# Patient Record
Sex: Female | Born: 1980 | Race: White | Hispanic: No | Marital: Married | Smoking: Former smoker
Health system: Southern US, Community
[De-identification: ages and names within clinical notes are randomized; demographics above are authoritative.]

## PROBLEM LIST (undated history)

## (undated) DIAGNOSIS — J309 Allergic rhinitis, unspecified: Secondary | ICD-10-CM

## (undated) DIAGNOSIS — R109 Unspecified abdominal pain: Secondary | ICD-10-CM

## (undated) DIAGNOSIS — G4700 Insomnia, unspecified: Secondary | ICD-10-CM

## (undated) DIAGNOSIS — N200 Calculus of kidney: Secondary | ICD-10-CM

## (undated) DIAGNOSIS — M549 Dorsalgia, unspecified: Secondary | ICD-10-CM

## (undated) DIAGNOSIS — Z87442 Personal history of urinary calculi: Secondary | ICD-10-CM

## (undated) DIAGNOSIS — S82202A Unspecified fracture of shaft of left tibia, initial encounter for closed fracture: Secondary | ICD-10-CM

## (undated) DIAGNOSIS — E739 Lactose intolerance, unspecified: Secondary | ICD-10-CM

## (undated) DIAGNOSIS — G43909 Migraine, unspecified, not intractable, without status migrainosus: Secondary | ICD-10-CM

## (undated) DIAGNOSIS — F988 Other specified behavioral and emotional disorders with onset usually occurring in childhood and adolescence: Secondary | ICD-10-CM

## (undated) DIAGNOSIS — Z91040 Latex allergy status: Secondary | ICD-10-CM

## (undated) DIAGNOSIS — G43009 Migraine without aura, not intractable, without status migrainosus: Secondary | ICD-10-CM

## (undated) DIAGNOSIS — M25579 Pain in unspecified ankle and joints of unspecified foot: Secondary | ICD-10-CM

## (undated) DIAGNOSIS — N1330 Unspecified hydronephrosis: Secondary | ICD-10-CM

## (undated) HISTORY — DX: Unspecified abdominal pain: R10.9

## (undated) HISTORY — DX: Unspecified fracture of shaft of left tibia, initial encounter for closed fracture: S82.202A

## (undated) HISTORY — DX: Other specified behavioral and emotional disorders with onset usually occurring in childhood and adolescence: F98.8

## (undated) HISTORY — DX: Calculus of kidney: N20.0

## (undated) HISTORY — DX: Personal history of urinary calculi: Z87.442

## (undated) HISTORY — PX: EYE SURGERY: SHX253

## (undated) HISTORY — DX: Allergic rhinitis, unspecified: J30.9

## (undated) HISTORY — DX: Lactose intolerance, unspecified: E73.9

## (undated) HISTORY — DX: Pain in unspecified ankle and joints of unspecified foot: M25.579

## (undated) HISTORY — DX: Insomnia, unspecified: G47.00

## (undated) HISTORY — DX: Dorsalgia, unspecified: M54.9

## (undated) HISTORY — PX: CATARACT EXTRACTION: SUR2

## (undated) HISTORY — PX: ABDOMINAL HYSTERECTOMY: SHX81

## (undated) HISTORY — DX: Migraine, unspecified, not intractable, without status migrainosus: G43.909

## (undated) HISTORY — DX: Unspecified hydronephrosis: N13.30

## (undated) HISTORY — DX: Migraine without aura, not intractable, without status migrainosus: G43.009

## (undated) HISTORY — DX: Latex allergy status: Z91.040

---
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
## 2003-07-08 HISTORY — PX: OTHER SURGICAL HISTORY: SHX169

## 2005-01-20 ENCOUNTER — Other Ambulatory Visit: Admission: RE | Admit: 2005-01-20 | Discharge: 2005-01-20 | Payer: Self-pay | Admitting: Obstetrics and Gynecology

## 2005-04-04 ENCOUNTER — Ambulatory Visit (HOSPITAL_COMMUNITY): Admission: RE | Admit: 2005-04-04 | Discharge: 2005-04-04 | Payer: Self-pay | Admitting: Obstetrics and Gynecology

## 2005-04-04 ENCOUNTER — Encounter (INDEPENDENT_AMBULATORY_CARE_PROVIDER_SITE_OTHER): Payer: Self-pay | Admitting: Specialist

## 2005-07-03 ENCOUNTER — Other Ambulatory Visit: Admission: RE | Admit: 2005-07-03 | Discharge: 2005-07-03 | Payer: Self-pay | Admitting: Obstetrics and Gynecology

## 2005-11-18 ENCOUNTER — Encounter (INDEPENDENT_AMBULATORY_CARE_PROVIDER_SITE_OTHER): Payer: Self-pay | Admitting: *Deleted

## 2005-11-18 ENCOUNTER — Ambulatory Visit (HOSPITAL_BASED_OUTPATIENT_CLINIC_OR_DEPARTMENT_OTHER): Admission: RE | Admit: 2005-11-18 | Discharge: 2005-11-18 | Payer: Self-pay | Admitting: Obstetrics and Gynecology

## 2005-11-25 ENCOUNTER — Other Ambulatory Visit: Admission: RE | Admit: 2005-11-25 | Discharge: 2005-11-25 | Payer: Self-pay | Admitting: Obstetrics and Gynecology

## 2006-01-19 ENCOUNTER — Emergency Department (HOSPITAL_COMMUNITY): Admission: EM | Admit: 2006-01-19 | Discharge: 2006-01-19 | Payer: Self-pay | Admitting: Emergency Medicine

## 2006-03-11 ENCOUNTER — Other Ambulatory Visit: Admission: RE | Admit: 2006-03-11 | Discharge: 2006-03-11 | Payer: Self-pay | Admitting: Obstetrics and Gynecology

## 2006-05-26 ENCOUNTER — Emergency Department (HOSPITAL_COMMUNITY): Admission: EM | Admit: 2006-05-26 | Discharge: 2006-05-26 | Payer: Self-pay | Admitting: Emergency Medicine

## 2006-05-29 ENCOUNTER — Emergency Department: Payer: Self-pay | Admitting: Emergency Medicine

## 2006-06-03 ENCOUNTER — Encounter: Admission: RE | Admit: 2006-06-03 | Discharge: 2006-06-03 | Payer: Self-pay | Admitting: *Deleted

## 2006-06-09 ENCOUNTER — Emergency Department: Payer: Self-pay | Admitting: Emergency Medicine

## 2006-06-18 ENCOUNTER — Encounter: Admission: RE | Admit: 2006-06-18 | Discharge: 2006-08-25 | Payer: Self-pay | Admitting: Obstetrics and Gynecology

## 2006-09-17 ENCOUNTER — Emergency Department (HOSPITAL_COMMUNITY): Admission: EM | Admit: 2006-09-17 | Discharge: 2006-09-17 | Payer: Self-pay | Admitting: Emergency Medicine

## 2007-01-19 ENCOUNTER — Emergency Department: Payer: Self-pay | Admitting: Emergency Medicine

## 2007-01-20 ENCOUNTER — Emergency Department: Payer: Self-pay | Admitting: Unknown Physician Specialty

## 2007-02-01 ENCOUNTER — Emergency Department (HOSPITAL_COMMUNITY): Admission: EM | Admit: 2007-02-01 | Discharge: 2007-02-01 | Payer: Self-pay | Admitting: Emergency Medicine

## 2007-05-06 ENCOUNTER — Emergency Department: Payer: Self-pay | Admitting: Emergency Medicine

## 2007-05-08 ENCOUNTER — Emergency Department: Payer: Self-pay

## 2007-09-01 ENCOUNTER — Emergency Department: Payer: Self-pay | Admitting: Emergency Medicine

## 2007-12-07 ENCOUNTER — Emergency Department: Payer: Self-pay | Admitting: Emergency Medicine

## 2007-12-09 ENCOUNTER — Emergency Department: Payer: Self-pay | Admitting: Emergency Medicine

## 2007-12-16 ENCOUNTER — Emergency Department: Payer: Self-pay | Admitting: Emergency Medicine

## 2008-03-16 ENCOUNTER — Emergency Department: Payer: Self-pay | Admitting: Emergency Medicine

## 2008-07-06 ENCOUNTER — Ambulatory Visit: Payer: Self-pay | Admitting: Unknown Physician Specialty

## 2008-07-07 HISTORY — PX: OOPHORECTOMY: SHX86

## 2008-10-10 ENCOUNTER — Emergency Department: Payer: Self-pay | Admitting: Emergency Medicine

## 2009-03-19 ENCOUNTER — Ambulatory Visit: Payer: Self-pay | Admitting: Internal Medicine

## 2009-03-19 DIAGNOSIS — E739 Lactose intolerance, unspecified: Secondary | ICD-10-CM

## 2009-03-19 DIAGNOSIS — G43109 Migraine with aura, not intractable, without status migrainosus: Secondary | ICD-10-CM | POA: Insufficient documentation

## 2009-03-19 DIAGNOSIS — Z87442 Personal history of urinary calculi: Secondary | ICD-10-CM | POA: Insufficient documentation

## 2009-03-19 DIAGNOSIS — G43009 Migraine without aura, not intractable, without status migrainosus: Secondary | ICD-10-CM

## 2009-03-19 DIAGNOSIS — M549 Dorsalgia, unspecified: Secondary | ICD-10-CM

## 2009-03-19 DIAGNOSIS — G4700 Insomnia, unspecified: Secondary | ICD-10-CM

## 2009-03-19 HISTORY — DX: Lactose intolerance, unspecified: E73.9

## 2009-03-19 HISTORY — DX: Personal history of urinary calculi: Z87.442

## 2009-03-19 HISTORY — DX: Dorsalgia, unspecified: M54.9

## 2009-03-19 HISTORY — DX: Migraine without aura, not intractable, without status migrainosus: G43.009

## 2009-03-19 HISTORY — DX: Insomnia, unspecified: G47.00

## 2009-03-21 ENCOUNTER — Telehealth: Payer: Self-pay | Admitting: Internal Medicine

## 2009-03-27 ENCOUNTER — Telehealth: Payer: Self-pay | Admitting: Internal Medicine

## 2009-04-03 ENCOUNTER — Ambulatory Visit: Payer: Self-pay | Admitting: Internal Medicine

## 2009-04-03 ENCOUNTER — Telehealth: Payer: Self-pay | Admitting: Internal Medicine

## 2009-04-03 DIAGNOSIS — M25579 Pain in unspecified ankle and joints of unspecified foot: Secondary | ICD-10-CM | POA: Insufficient documentation

## 2009-04-03 HISTORY — DX: Pain in unspecified ankle and joints of unspecified foot: M25.579

## 2009-04-30 ENCOUNTER — Ambulatory Visit: Payer: Self-pay | Admitting: Endocrinology

## 2009-04-30 DIAGNOSIS — J069 Acute upper respiratory infection, unspecified: Secondary | ICD-10-CM | POA: Insufficient documentation

## 2009-05-11 ENCOUNTER — Ambulatory Visit: Payer: Self-pay | Admitting: Cardiovascular Disease

## 2009-05-11 ENCOUNTER — Ambulatory Visit: Payer: Self-pay | Admitting: Internal Medicine

## 2009-05-11 DIAGNOSIS — N200 Calculus of kidney: Secondary | ICD-10-CM | POA: Insufficient documentation

## 2009-05-11 DIAGNOSIS — N1330 Unspecified hydronephrosis: Secondary | ICD-10-CM

## 2009-05-11 DIAGNOSIS — R109 Unspecified abdominal pain: Secondary | ICD-10-CM | POA: Insufficient documentation

## 2009-05-11 HISTORY — DX: Unspecified hydronephrosis: N13.30

## 2009-05-11 HISTORY — DX: Calculus of kidney: N20.0

## 2009-05-11 HISTORY — DX: Unspecified abdominal pain: R10.9

## 2009-05-11 LAB — CONVERTED CEMR LAB
ALT: 15 units/L (ref 0–35)
BUN: 6 mg/dL (ref 6–23)
Basophils Relative: 2.8 % (ref 0.0–3.0)
CO2: 31 meq/L (ref 19–32)
Chloride: 100 meq/L (ref 96–112)
Eosinophils Absolute: 0.4 10*3/uL (ref 0.0–0.7)
Eosinophils Relative: 2.2 % (ref 0.0–5.0)
Glucose, Urine, Semiquant: NEGATIVE
HCT: 40.4 % (ref 36.0–46.0)
Ketones, urine, test strip: NEGATIVE
Lipase: 19 units/L (ref 11.0–59.0)
Lymphs Abs: 2.9 10*3/uL (ref 0.7–4.0)
MCHC: 33.9 g/dL (ref 30.0–36.0)
MCV: 85.8 fL (ref 78.0–100.0)
Monocytes Absolute: 1.3 10*3/uL — ABNORMAL HIGH (ref 0.1–1.0)
Nitrite: NEGATIVE
Platelets: 375 10*3/uL (ref 150.0–400.0)
Potassium: 4.1 meq/L (ref 3.5–5.1)
Protein, U semiquant: NEGATIVE
Total Bilirubin: 0.5 mg/dL (ref 0.3–1.2)
Total Protein: 7.2 g/dL (ref 6.0–8.3)
Urobilinogen, UA: 0.2
WBC: 17.6 10*3/uL — ABNORMAL HIGH (ref 4.5–10.5)

## 2009-05-12 ENCOUNTER — Inpatient Hospital Stay: Payer: Self-pay | Admitting: Internal Medicine

## 2009-05-12 ENCOUNTER — Encounter: Payer: Self-pay | Admitting: Internal Medicine

## 2009-05-12 ENCOUNTER — Emergency Department: Payer: Self-pay | Admitting: Emergency Medicine

## 2009-05-12 ENCOUNTER — Telehealth: Payer: Self-pay | Admitting: Family Medicine

## 2009-05-14 ENCOUNTER — Telehealth: Payer: Self-pay | Admitting: Internal Medicine

## 2009-05-17 ENCOUNTER — Ambulatory Visit: Payer: Self-pay | Admitting: Urology

## 2009-05-21 ENCOUNTER — Ambulatory Visit: Payer: Self-pay | Admitting: Urology

## 2009-07-07 HISTORY — PX: LITHOTRIPSY: SUR834

## 2009-07-16 ENCOUNTER — Telehealth: Payer: Self-pay | Admitting: Internal Medicine

## 2009-08-06 ENCOUNTER — Telehealth: Payer: Self-pay | Admitting: Internal Medicine

## 2009-08-07 ENCOUNTER — Ambulatory Visit: Payer: Self-pay | Admitting: Internal Medicine

## 2009-10-11 ENCOUNTER — Ambulatory Visit: Payer: Self-pay | Admitting: Internal Medicine

## 2009-10-11 DIAGNOSIS — J309 Allergic rhinitis, unspecified: Secondary | ICD-10-CM

## 2009-10-11 HISTORY — DX: Allergic rhinitis, unspecified: J30.9

## 2009-11-22 ENCOUNTER — Ambulatory Visit: Payer: Self-pay | Admitting: Internal Medicine

## 2010-01-14 ENCOUNTER — Ambulatory Visit: Payer: Self-pay | Admitting: Internal Medicine

## 2010-02-13 ENCOUNTER — Telehealth: Payer: Self-pay | Admitting: Internal Medicine

## 2010-02-21 ENCOUNTER — Encounter: Payer: Self-pay | Admitting: Internal Medicine

## 2010-04-08 ENCOUNTER — Telehealth: Payer: Self-pay | Admitting: Internal Medicine

## 2010-04-11 ENCOUNTER — Telehealth: Payer: Self-pay | Admitting: Internal Medicine

## 2010-04-11 DIAGNOSIS — Z91040 Latex allergy status: Secondary | ICD-10-CM

## 2010-04-11 HISTORY — DX: Latex allergy status: Z91.040

## 2010-04-18 ENCOUNTER — Telehealth: Payer: Self-pay | Admitting: Internal Medicine

## 2010-05-20 ENCOUNTER — Telehealth: Payer: Self-pay | Admitting: Internal Medicine

## 2010-05-24 ENCOUNTER — Ambulatory Visit: Payer: Self-pay | Admitting: Internal Medicine

## 2010-06-12 ENCOUNTER — Ambulatory Visit: Payer: Self-pay | Admitting: Internal Medicine

## 2010-06-26 ENCOUNTER — Telehealth: Payer: Self-pay | Admitting: Internal Medicine

## 2010-07-11 ENCOUNTER — Ambulatory Visit
Admission: RE | Admit: 2010-07-11 | Discharge: 2010-07-11 | Payer: Self-pay | Source: Home / Self Care | Attending: Internal Medicine | Admitting: Internal Medicine

## 2010-07-16 ENCOUNTER — Telehealth: Payer: Self-pay | Admitting: Internal Medicine

## 2010-07-16 ENCOUNTER — Encounter: Payer: Self-pay | Admitting: Internal Medicine

## 2010-08-06 NOTE — Progress Notes (Signed)
  Phone Note Call from Patient Call back at [phone] or [phone]   Summary of Call: Pt left message on triage stating that she was given an anti-inflammatory for hip pain and she cannot take it b/c she is allergic to NSAIDS. Pt wants to be seen. Called pt for move info left message on voicemail for call back. Initial call taken by: Margaret Pyle, CMA,  August 06, 2009 10:09 AM  Follow-up for Phone Call        left message on machine for pt to return my call. Margaret Pyle, CMA  August 06, 2009 2:02 PM   Additional Follow-up for Phone Call Additional follow up Details #1::        pt scheduled appt tomorrow Additional Follow-up by: Margaret Pyle, CMA,  August 06, 2009 4:25 PM

## 2010-08-06 NOTE — Assessment & Plan Note (Signed)
Summary: allergies/cb   Vital Signs:  Patient profile:   30 year old female Height:      68 inches Weight:      191 pounds BMI:     29.15 O2 Sat:      97 % on Room air Pulse rate:   85 / minute BP sitting:   116 / 68  (left arm) Cuff size:   regular  Vitals Entered By: Reynaldo Minium CMA (May 24, 2010 2:00 PM)  O2 Flow:  Room air CC: Allergy consult-Dr. Jonny Ruiz   Primary Provider/Referring Provider:  Oliver Barre  CC:  Allergy consult-Dr. Jonny Ruiz.  History of Present Illness: May 24, 2010- 29 yoF referred by Dr Jonny Ruiz for allergy evaluation. 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 She describes contact latex dermatitis for many years. Nitrile gloves now also give contact irritation on her hands so she is using polypropylene gloves.  5 weeks ago balloons were brought into her work area. She began itching and chest got tight. Took benadryl, but she says she got more short of breath then woke with paramedics around her, but didn't need to go to hospital.  Hx of skin testing age 89, but doesn't describe allergy vaccine then. Cats cause eyes to swell. Seasonal allergic rhinitis has been unusually bad this Fall. Asthma as a young child. Pneumonia 5 years ago. Strong local reaction to fire ant stings while in [place] of [place].  Aspirin makes eyes itch and swell- no hx of polyps. Last prednisone 6 months ago.   Preventive Screening-Counseling & Management  Alcohol-Tobacco     Smoking Status: quit     Packs/Day: 1.0     Year Started: smoked for approx 8-10 years     Year Quit: 2011  Current Medications (verified): 1)  Zolpidem Tartrate 10 Mg Tabs (Zolpidem Tartrate) .Marland Kitchen.. 1po At Bedtime As Needed 2)  Tramadol Hcl 50 Mg Tabs (Tramadol Hcl) .Marland Kitchen.. 1 By Mouth Q 6 Hrs As Needed Pain 3)  Carisoprodol 350 Mg Tabs (Carisoprodol) .Marland Kitchen.. 1 By Mouth Four Times Per Day As Needed 4)  Calcium 1500 Mg Tabs (Calcium Carbonate) .... Take 1 By Mouth Once  Daily  Allergies (verified): 1)  ! Nsaids 2)  ! Penicillin 3)  ! Augmentin 4)  ! Ibuprofen 5)  ! Aspirin 6)  * Latex  Past History:  Past Medical History: Last updated: 10/11/2009 Nephrolithiasis, hx of migraine PCOS and endometriosis glucose intolerance goiter by exam recurrent lower back pain Allergic rhinitis  Past Surgical History: Last updated: 03/19/2009 Hysterectomy Oophorectomy bilat  - 07/2008 Appendectomy - 1994 s/p left knee arthroscopic 1994 hx of etopic pregnancy 2005  Family History: Last updated: 05/24/2010 mother with manic depressive/bipolar, ETOH, low thyroid father with HTN, seasonal rhinitis grandmother with HTN , DM, heart disease, stroke grandfather with lung cancer  Social History: Last updated: 03/19/2009 Married no children 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  The patient  denies shortness of breath with activity, shortness of breath at rest, non-productive cough, coughing up blood, chest pain, irregular heartbeats, acid heartburn, indigestion, loss of appetite, weight change, abdominal pain, difficulty swallowing, sore throat, tooth/dental problems, ear ache, anxiety, depression, hand/feet swelling, joint stiffness or pain, rash, and fever.    Physical Exam  Additional Exam:  General: A/Ox3; pleasant and cooperative, NAD, healthy appearing SKIN: no rash, lesions NODES: no  lymphadenopathy HEENT: [place]/AT, EOM- WNL, Conjuctivae- clear, PERRLA, TM-WNL, Nose- clear, Throat- clear and wnl, tongue stud, tonsils NECK: Supple w/ fair ROM, JVD- none, normal carotid impulses w/o bruits Thyroid- normal to palpation CHEST: Clear to P&A HEART: RRR, no m/g/r heard ABDOMEN: Soft and nl; nml bowel sounds; no organomegaly or masses noted KGM:WNUU, nl pulses, no edema  NEURO: Grossly intact to observation      Impression & Recommendations:  Problem # 1:  *** LATEX ALLERGY *** (ICD-V15.07) History consistent with significiant allergy to latex and also to Nitrile gloves. She should not be exposed to latex in any form, including balloons or gloves.  Note for work to avoid exposure Epipen latex IgE panel- through her employer/ [facility]  Problem # 2:  ALLERGIC RHINITIS (ICD-477.9) She also describes a seasonal allergy pattern with rhinitis and conjunctivitis.  We wlil get IgE allergy profile, bring her back for skin testing. She can use antihistamines, decongestants, and we will try a nasal steroid.  The following medications were removed from the medication list:    Levocetirizine Dihydrochloride 5 Mg Tabs (Levocetirizine dihydrochloride) .Marland Kitchen... 1po once daily as needed Her updated medication list for this problem includes:    Flonase 50 Mcg/act Susp (Fluticasone propionate) .Marland Kitchen... 1-2 sprays each nostril every night at bedtime during allergy season  Medications Added to Medication List This Visit: 1)  Calcium 1500 Mg Tabs (Calcium carbonate) .... Take 1 by mouth once daily 2)  Epipen 2-pak 0.3 Mg/0.40ml Devi (Epinephrine) .... For severe allergic reaction 3)  Flonase 50 Mcg/act Susp (Fluticasone propionate) .Marland Kitchen.. 1-2 sprays each nostril every night at bedtime during allergy season  Other Orders: Consultation Level IV (72536)  Patient Instructions: 1)  Schedule return as able for allergy skin testing.-Stop all antihistamines 3 days before skin testing, including cold and  allergy meds, otc sleep and cough meds.  2)  Sample Omnaris nasal steroid spray, with back up script for Flonase/ fluticasone 3)    1-2 sprays each nostril once daily at bedtime 4)  Script Epipen sent to drug store 5)  Note for work- avoid all latex. Use polypropylene gloves 6)  Labs to be drawn at [facility]- allergy profile, latex panel, total IgE 7)  cc Dr Jonny Ruiz Prescriptions: Aleda Grana 50 MCG/ACT SUSP (FLUTICASONE PROPIONATE) 1-2 sprays each nostril every night at bedtime during allergy season  #1 x prn   Entered and Authorized by:   Waymon Budge MD   Signed by:   Waymon Budge MD on 05/24/2010   Method used:   Print then Give to Patient   RxID:   [id number] EPIPEN 2-PAK 0.3 MG/0.3ML DEVI (EPINEPHRINE) For severe allergic reaction  #1 x prn   Entered and Authorized by:   Waymon Budge MD   Signed by:   Waymon Budge MD on 05/24/2010   Method used:   Electronically to        [facility]       [address]       [place]  [place], [place]  [address]       Ph: [phone]       Fax: [phone]   RxID:   [id number]    Orders Added: 1)  Consultation Level IV [40102]

## 2010-08-06 NOTE — Assessment & Plan Note (Signed)
Summary: PULLED MUSCLE IN NECK/NWS   Vital Signs:  Patient profile:   30 year old female Height:      68 inches Weight:      188.25 pounds BMI:     28.73 O2 Sat:      98 % on Room air Temp:     98.1 degrees F oral Pulse rate:   90 / minute BP sitting:   110 / 80  (left arm) Cuff size:   regular  Vitals Entered ByZella Ball Ewing (Nov 22, 2009 4:42 PM)  O2 Flow:  Room air CC: Pulled muscle in neck/RE   CC:  Pulled muscle in neck/RE.  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  No other trauma or injury;  no prior hx of same, no recent films or MRI, ortho or chiropracter tx.   No fever, ST, or cough and Pt denies CP, sob, doe, wheezing, orthopnea, pnd, worsening LE edema, palps, dizziness or syncope  Pt denies new neuro symptoms such as headache, facial or extremity weakness   Problems Prior to Update: 1)  Allergic Rhinitis  (ICD-477.9) 2)  Renal Calculus, Right  (ICD-592.0) 3)  Hydronephrosis, Right  (ICD-591) 4)  Flank Pain, Right  (ICD-789.09) 5)  Uri  (ICD-465.9) 6)  Ankle Pain, Right  (ICD-719.47) 7)  Insomnia-sleep Disorder-unspec  (ICD-780.52) 8)  Back Pain  (ICD-724.5) 9)  Glucose Intolerance  (ICD-271.3) 10)  Common Migraine  (ICD-346.10) 11)  Nephrolithiasis, Hx of   (ICD-V13.01)  Medications Prior to Update: 1)  Ortho Tri-Cyclen Lo 0.18/0.215/0.25 Mg-25 Mcg Tabs (Norgestim-Eth Estrad Triphasic) .Marland Kitchen.. 1 By Mouth Once Daily 2)  Zolpidem Tartrate 10 Mg Tabs (Zolpidem Tartrate) .Marland Kitchen.. 1po At Bedtime As Needed 3)  Tramadol Hcl 50 Mg Tabs (Tramadol Hcl) .Marland Kitchen.. 1 By Mouth Q 6 Hrs As Needed 4)  Flexeril 5 Mg Tabs (Cyclobenzaprine Hcl) .Marland Kitchen.. 1 By Mouth Three Times A Day As Needed Spasm 5)  Azithromycin 250 Mg Tabs (Azithromycin) .... 2po Qd For 1 Day, Then 1po Qd For 4days, Then Stop 6)  Fexofenadine Hcl 180 Mg Tabs (Fexofenadine Hcl) .Marland Kitchen.. 1po Once Daily As Needed Allergies (Generic Allegra)  Current Medications (verified): 1)  Ortho Tri-Cyclen Lo 0.18/0.215/0.25 Mg-25 Mcg Tabs (Norgestim-Eth Estrad Triphasic) .Marland Kitchen.. 1 By Mouth Once Daily 2)  Zolpidem Tartrate 10 Mg Tabs (Zolpidem Tartrate) .Marland Kitchen.. 1po At Bedtime As Needed 3)  Hydrocodone-Acetaminophen 7.5-325 Mg Tabs (Hydrocodone-Acetaminophen) .Marland Kitchen.. 1 By Mouth Q 6 Hrs As Needed Pain 4)  Carisoprodol 350 Mg Tabs (Carisoprodol) .Marland Kitchen.. 1 By Mouth Four Times Per Day As Needed 5)  Fexofenadine Hcl 180 Mg Tabs (Fexofenadine Hcl) .Marland Kitchen.. 1po Once Daily As Needed Allergies (Generic Allegra) 6)  Prednisone 10 Mg Tabs (Prednisone) .... 4po Qd For 3days, Then 3po Qd For 3days, Then 2po Qd  For 3days, Then 1po Qd For 3 Days, Then Stop  Allergies (verified): 1)  ! Nsaids 2)  ! Penicillin 3)  ! Augmentin 4)  ! Ibuprofen 5)  ! 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   Head:  normocephalic and atraumatic.   Eyes:  vision grossly intact, pupils equal, and pupils round.   Ears:  R ear normal and L ear normal.   Nose:  no external deformity and no external erythema.   Mouth:  no gingival abnormalities and pharynx pink and moist.   Neck:  supple and no masses.   Lungs:  normal respiratory effort and normal breath sounds.   Heart:  normal rate and regular rhythm.   Msk:  mod to severe tender right trapezoid and post lat neck;  spine nontender througout and no paravertebral swelling, no rash Extremities:  no edema, no erythema  Neurologic:  cranial nerves II-XII intact, strength normal in all extremities, sensation intact to light touch, gait normal, and DTRs symmetrical and normal.     Impression & Recommendations:  Problem # 1:  BACK PAIN (ICD-724.5)  Her updated medication list for this problem includes:    Hydrocodone-acetaminophen 7.5-325 Mg Tabs (Hydrocodone-acetaminophen) .Marland Kitchen... 1 by mouth q 6 hrs as needed pain    Carisoprodol 350 Mg Tabs (Carisoprodol) .Marland Kitchen... 1 by mouth four times per day as needed trapezoid with right neck pain  - cant r/o radiculitis but exam is benign and suspect more likely severe MSK strain related to work;  ok for med tx as above and pred pack, consider MRI and/or ortho if worse; gave note for work  Complete Medication List: 1)  Ortho Tri-cyclen Lo 0.18/0.215/0.25 Mg-25 Mcg Tabs (Norgestim-eth estrad triphasic) .Marland Kitchen.. 1 by mouth once daily 2)  Zolpidem Tartrate 10 Mg Tabs (Zolpidem tartrate) .Marland Kitchen.. 1po at bedtime as needed 3)  Hydrocodone-acetaminophen 7.5-325 Mg Tabs (Hydrocodone-acetaminophen) .Marland Kitchen.. 1 by mouth q 6 hrs as needed pain 4)  Carisoprodol 350 Mg Tabs (Carisoprodol) .Marland Kitchen.. 1 by mouth four times per day as  needed 5)  Fexofenadine Hcl 180 Mg Tabs (Fexofenadine hcl) .Marland Kitchen.. 1po once daily as needed allergies (generic allegra) 6)  Prednisone 10 Mg Tabs (Prednisone) .... 4po qd for 3days, then 3po qd for 3days, then 2po qd for 3days, then 1po qd for 3 days, then stop  Patient Instructions: 1)  Please take all new medications as prescribed - the pain medicine, muscle relaxer, and prednisone 2)  Continue all previous medications as before this visit  3)  Please schedule a follow-up appointment as needed. Prescriptions: HYDROCODONE-ACETAMINOPHEN 7.5-325 MG TABS (HYDROCODONE-ACETAMINOPHEN) 1 by mouth q 6 hrs as needed pain  #60 x 1   Entered and Authorized by:   Corwin Levins MD   Signed by:   Corwin Levins MD on 11/22/2009   Method used:   Print then  Give to Patient   RxID:   [id number] PREDNISONE 10 MG TABS (PREDNISONE) 4po qd for 3days, then 3po qd for 3days, then 2po qd for 3days, then 1po qd for 3 days, then stop  #30 x 0   Entered and Authorized by:   Corwin Levins MD   Signed by:   Corwin Levins MD on 11/22/2009   Method used:   Print then Give to Patient   RxID:   [id number] CARISOPRODOL 350 MG TABS (CARISOPRODOL) 1 by mouth four times per day as needed  #60 x 1   Entered and Authorized by:   Corwin Levins MD   Signed by:   Corwin Levins MD on 11/22/2009   Method used:   Print then Give to Patient   RxID:   [id number]

## 2010-08-06 NOTE — Letter (Signed)
Summary: Out of Work  [facility]  [address] [place], [place] [address]   Phone: [phone]  Fax: [phone]    October 11, 2009   Employee:  [name]    To Whom It May Concern:   For Medical reasons, please excuse the above named employee from work for the following dates:  Start:   Oct 11, 2009  End:   Oct 13, 2009   -   to return to work after this date  If you need additional information, please feel free to contact our office.         Sincerely,    Corwin Levins MD

## 2010-08-06 NOTE — Progress Notes (Signed)
Summary: medication refill  Phone Note Refill Request Message from:  Fax from Pharmacy on April 18, 2010 4:29 PM  Refills Requested: Medication #1:  ZOLPIDEM TARTRATE 10 MG TABS 1po at bedtime as needed   Dosage confirmed as above?Dosage Confirmed   Last Refilled: 10/11/2009   Notes: [facility] [address] [place], [place] ZOX#[id number] Initial call taken by: Zella Ball Ewing CMA Duncan Dull),  April 18, 2010 4:29 PM  Follow-up for Phone Call        done hardcopy to [facility] side B - dahlia  Follow-up by: Corwin Levins MD,  April 18, 2010 4:36 PM  Additional Follow-up for Phone Call Additional follow up Details #1::        faxed hardcopy to pharmacy. Additional Follow-up by: Robin Ewing CMA Duncan Dull),  April 18, 2010 4:38 PM    New/Updated Medications: ZOLPIDEM TARTRATE 10 MG TABS (ZOLPIDEM TARTRATE) 1po at bedtime as needed Prescriptions: ZOLPIDEM TARTRATE 10 MG TABS (ZOLPIDEM TARTRATE) 1po at bedtime as needed  #30 x 5   Entered and Authorized by:   Corwin Levins MD   Signed by:   Corwin Levins MD on 04/18/2010   Method used:   Print then Give to Patient   RxID:   [id number]

## 2010-08-06 NOTE — Letter (Signed)
Summary: [facility]  [facility]   Imported By: Lester Antioch 02/28/2010 09:13:17  _____________________________________________________________________  External Attachment:    Type:   Image     Comment:   External Document

## 2010-08-06 NOTE — Assessment & Plan Note (Signed)
Summary: sore throat  stc   Vital Signs:  Patient profile:   30 year old female Height:      68 inches Weight:      185.25 pounds BMI:     28.27 O2 Sat:      99 % on Room air Temp:     98.4 degrees F oral Pulse rate:   99 / minute BP sitting:   108 / 70  (left arm) Cuff size:   regular  Vitals Entered ByZella Ball Ewing (October 11, 2009 10:02 AM)  O2 Flow:  Room air CC: Sore Throat, refills/RE   CC:  Sore Throat and refills/RE.  History of Present Illness: here wtih 2 to 3 days onset mild to mod ST with slight cough nonprod and headache, general weakness and malaise;  Pt denies CP, sob, doe, wheezing, orthopnea, pnd, worsening LE edema, palps, dizziness or syncope This is on top of 3 to 4 wks onset nasal allergy symtpoms with clear drainage, no facial pain or colored d/c or tongue swelling or wheezing.   Also mentions trazodone not working as well as [place] did previously.  also states back pain no change and persistent, without change in radicular symtpoms, fever, wt loss, night sweats, bowel or bladder change - needs med refills,. tramadol works well.    Problems Prior to Update: 1)  Pharyngitis-acute  (ICD-462) 2)  Allergic Rhinitis  (ICD-477.9) 3)  Renal Calculus, Right  (ICD-592.0) 4)  Hydronephrosis, Right  (ICD-591) 5)  Flank Pain, Right  (ICD-789.09) 6)  Uri  (ICD-465.9) 7)  Ankle Pain, Right  (ICD-719.47) 8)  Insomnia-sleep Disorder-unspec  (ICD-780.52) 9)  Back Pain  (ICD-724.5) 10)  Glucose Intolerance  (ICD-271.3) 11)  Common Migraine  (ICD-346.10) 12)  Nephrolithiasis, Hx of  (ICD-V13.01)  Medications Prior to Update: 1)  Ortho Tri-Cyclen Lo 0.18/0.215/0.25 Mg-25 Mcg Tabs (Norgestim-Eth Estrad Triphasic) .Marland Kitchen.. 1 By Mouth Once Daily 2)  Trazodone Hcl 50 Mg Tabs (Trazodone Hcl) .Marland Kitchen.. 1 By Mouth At Bedtime As Needed 3)  Tramadol Hcl 50 Mg Tabs (Tramadol Hcl) .Marland Kitchen.. 1 By Mouth Q 6 Hrs As Needed 4)  Flexeril 5 Mg Tabs (Cyclobenzaprine Hcl) .Marland Kitchen.. 1 By Mouth Three Times A  Day As Needed Spasm  Current Medications (verified): 1)  Ortho Tri-Cyclen Lo 0.18/0.215/0.25 Mg-25 Mcg Tabs (Norgestim-Eth Estrad Triphasic) .Marland Kitchen.. 1 By Mouth Once Daily 2)  Zolpidem Tartrate 10 Mg Tabs (Zolpidem Tartrate) .Marland Kitchen.. 1po At Bedtime As Needed 3)  Tramadol Hcl 50 Mg Tabs (Tramadol Hcl) .Marland Kitchen.. 1 By Mouth Q 6 Hrs As Needed 4)  Flexeril 5 Mg Tabs (Cyclobenzaprine Hcl) .Marland Kitchen.. 1 By Mouth Three Times A Day As Needed Spasm 5)  Azithromycin 250 Mg Tabs (Azithromycin) .... 2po Qd For 1 Day, Then 1po Qd For 4days, Then Stop 6)  Fexofenadine Hcl 180 Mg Tabs (Fexofenadine Hcl) .Marland Kitchen.. 1po Once Daily As Needed Allergies (Generic Allegra)  Allergies (verified): 1)  ! Nsaids 2)  ! Penicillin 3)  ! Augmentin 4)  ! Ibuprofen 5)  ! 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   Eyes:  vision grossly intact, pupils equal, and pupils round.   Ears:  bilat tm's red, sinus nontender Nose:  nasal dischargemucosal pallor and mucosal edema.   Mouth:  pharyngeal erythema, fair dentition, and pharyngeal exudate.   Neck:  supple and cervical lymphadenopathy.   Lungs:  normal respiratory effort and normal breath sounds.   Heart:  normal rate and regular rhythm.   Msk:  no spine tender or paravertebral tender or  spasm Extremities:  no edema, no erythema  Neurologic:  strength normal in all extremities and DTRs symmetrical and normal.     Impression & Recommendations:  Problem # 1:  PHARYNGITIS-ACUTE (ICD-462)  Her updated medication list for this problem includes:    Azithromycin 250 Mg Tabs (Azithromycin) .Marland Kitchen... 2po qd for 1 day, then 1po qd for 4days, then stop treat as above, f/u any worsening signs or symptoms , off work note given  Problem # 2:  ALLERGIC RHINITIS (ICD-477.9)  Her updated medication list for this problem includes:    Fexofenadine Hcl 180 Mg Tabs (Fexofenadine hcl) .Marland Kitchen... 1po once daily as needed allergies (generic allegra) treat as above, f/u any worsening signs or symptoms   Problem # 3:  INSOMNIA-SLEEP DISORDER-UNSPEC (ICD-780.52)  Her updated medication list for this problem includes:    Zolpidem Tartrate 10 Mg Tabs (Zolpidem tartrate) .Marland Kitchen... 1po at bedtime as needed change back to above as needed use  Problem # 4:  BACK PAIN (ICD-724.5)  Her updated medication list for this problem includes:    Tramadol Hcl 50 Mg Tabs (Tramadol hcl) .Marland Kitchen... 1 by mouth q 6 hrs as needed    Flexeril 5 Mg Tabs (Cyclobenzaprine hcl) .Marland Kitchen... 1 by mouth three times a day as needed spasm recurrent, mild to mod, stable overall by hx and exam, ok to continue meds/tx as is   Complete Medication List: 1)  Ortho Tri-cyclen Lo 0.18/0.215/0.25 Mg-25 Mcg Tabs (Norgestim-eth estrad triphasic) .Marland Kitchen.. 1 by mouth once daily 2)  Zolpidem Tartrate 10 Mg Tabs (Zolpidem tartrate) .Marland Kitchen.. 1po at bedtime as needed 3)  Tramadol Hcl 50 Mg Tabs (Tramadol hcl) .Marland Kitchen.. 1 by mouth q 6 hrs as needed 4)  Flexeril 5 Mg Tabs (Cyclobenzaprine hcl) .Marland Kitchen.. 1 by mouth three times a day as needed spasm 5)  Azithromycin 250 Mg Tabs (Azithromycin) .... 2po qd for 1 day, then 1po qd for 4days, then stop 6)  Fexofenadine Hcl 180 Mg Tabs (Fexofenadine hcl) .Marland Kitchen.. 1po once daily as needed allergies (generic allegra)  Patient  Instructions: 1)  Please take all new medications as prescribed  2)  Continue all previous medications as before this visit  3)  Please schedule a follow-up appointment as needed. Prescriptions: FEXOFENADINE HCL 180 MG TABS (FEXOFENADINE HCL) 1po once daily as needed allergies (generic allegra)  #30 x 5   Entered and Authorized by:   Corwin Levins MD   Signed by:   Corwin Levins MD on 10/11/2009   Method used:   Print then Give to Patient   RxID:   [id number] AZITHROMYCIN 250 MG TABS (AZITHROMYCIN) 2po qd for 1 day, then 1po qd for 4days, then stop  #6 x 1   Entered and Authorized by:   Corwin Levins MD  Signed by:   Corwin Levins MD on 10/11/2009   Method used:   Print then Give to Patient   RxID:   [id number] FLEXERIL 5 MG TABS (CYCLOBENZAPRINE HCL) 1 by mouth three times a day as needed spasm  #60 x 2   Entered and Authorized by:   Corwin Levins MD   Signed by:   Corwin Levins MD on 10/11/2009   Method used:   Print then Give to Patient   RxID:   [id number] TRAMADOL HCL 50 MG TABS (TRAMADOL HCL) 1 by mouth q 6 hrs as needed  #60 x 2   Entered and Authorized by:   Corwin Levins MD   Signed by:   Corwin Levins MD on 10/11/2009   Method used:   Print then Give to Patient   RxID:   [id number] ZOLPIDEM TARTRATE 10 MG TABS (ZOLPIDEM TARTRATE) 1po at bedtime as needed  #30 x 5   Entered and Authorized by:   Corwin Levins MD   Signed by:   Corwin Levins MD on 10/11/2009   Method used:   Print then Give to Patient   RxID:   [id number]

## 2010-08-06 NOTE — Assessment & Plan Note (Signed)
Summary: med problem/#/cd   Vital Signs:  Patient profile:   30 year old female Height:      68 inches Weight:      184 pounds BMI:     28.08 O2 Sat:      98 % on Room air Temp:     98.2 degrees F oral Pulse rate:   87 / minute BP sitting:   120 / 84  (right arm) Cuff size:   regular  Vitals Entered ByZella Ball Ewing (August 07, 2009 8:21 AM)  O2 Flow:  Room air CC: med change/RE   CC:  med change/RE.  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  Last lumbar films neg in sept 2010 for acute , and no sig deg changes.  Has tens unit for pain related to ? sciatica in the past but cant use all the time.  No GU symtpoms now such as dysuria or blood.  Meloxicam and ambien not working at this time.  More stress lately as well, but denies depressive symptoms or panic. No recent increased headaches or migraine.    Problems Prior to Update: 1)  Renal Calculus, Right  (ICD-592.0) 2)  Hydronephrosis, Right  (ICD-591) 3)  Flank Pain, Right  (ICD-789.09) 4)  Uri  (ICD-465.9) 5)  Ankle Pain, Right  (ICD-719.47) 6)  Insomnia-sleep Disorder-unspec   (ICD-780.52) 7)  Back Pain  (ICD-724.5) 8)  Glucose Intolerance  (ICD-271.3) 9)  Common Migraine  (ICD-346.10) 10)  Nephrolithiasis, Hx of  (ICD-V13.01)  Medications Prior to Update: 1)  Ortho Tri-Cyclen Lo 0.18/0.215/0.25 Mg-25 Mcg Tabs (Norgestim-Eth Estrad Triphasic) .Marland Kitchen.. 1 By Mouth Once Daily 2)  Zolpidem Tartrate 10 Mg Tabs (Zolpidem Tartrate) .Marland Kitchen.. 1 By Mouth At Bedtime As Needed 3)  Meloxicam 15 Mg Tabs (Meloxicam) .Marland Kitchen.. 1po Once Daily As Needed 4)  Ciprofloxacin Hcl 500 Mg Tabs (Ciprofloxacin Hcl) .Marland Kitchen.. 1 By Mouth Two Times A Day 5)  Oxycodone Hcl 5 Mg Tabs (Oxycodone Hcl) .Marland Kitchen.. 1  - 3 By Mouth Q 6 Hrs As Needed Pain  Current Medications (verified): 1)  Ortho Tri-Cyclen Lo 0.18/0.215/0.25 Mg-25 Mcg Tabs (Norgestim-Eth Estrad Triphasic) .Marland Kitchen.. 1 By Mouth Once Daily 2)  Trazodone Hcl 50 Mg Tabs (Trazodone Hcl) .Marland Kitchen.. 1 By Mouth At Bedtime As Needed 3)  Tramadol Hcl 50 Mg Tabs (Tramadol Hcl) .Marland Kitchen.. 1 By Mouth Q 6 Hrs As Needed 4)  Flexeril 5 Mg Tabs (Cyclobenzaprine Hcl) .Marland Kitchen.. 1 By Mouth Three Times A Day As Needed Spasm  Allergies (verified): 1)  !  Nsaids 2)  ! Penicillin 3)  ! Augmentin 4)  ! Ibuprofen 5)  ! 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   Head:  normocephalic and atraumatic.   Eyes:  vision grossly intact, pupils equal, and pupils round.    Ears:  R ear normal and L ear normal.   Nose:  no external deformity and no nasal discharge.   Mouth:  no gingival abnormalities and pharynx pink and moist.   Neck:  supple and no masses.   Lungs:  normal respiratory effort and normal breath sounds.   Heart:  normal rate and regular rhythm.   Msk:  spine nontender and bilat lumbar and flank areas nontender Extremities:  no edema, no erythema  Neurologic:  cranial nerves II-XII intact, strength normal in all extremities, and sensation intact to light touch.     Impression & Recommendations:  Problem # 1:  BACK PAIN (ICD-724.5)  Her updated medication list for this problem includes:    Tramadol Hcl 50 Mg Tabs (Tramadol hcl) .Marland Kitchen... 1 by mouth q 6 hrs as needed    Flexeril 5 Mg Tabs (Cyclobenzaprine hcl) .Marland Kitchen... 1 by mouth three times a day as needed spasm treat as above, f/u any worsening signs or symptoms , c/w mechanical pain most likely, consider MRI for persistent symtpoms  Problem # 2:  INSOMNIA-SLEEP DISORDER-UNSPEC (ICD-780.52) for trazodone at bedtime as needed   Problem # 3:  COMMON MIGRAINE (ICD-346.10)  Her updated medication list for this problem includes:    Tramadol Hcl 50 Mg Tabs (Tramadol hcl) .Marland Kitchen... 1 by mouth q 6 hrs as needed stable overall by hx and exam, ok to continue meds/tx as is, ok for otc meds as needed as well such as excedrin migraine  Complete Medication List: 1)  Ortho Tri-cyclen Lo 0.18/0.215/0.25 Mg-25 Mcg Tabs (Norgestim-eth estrad triphasic) .Marland Kitchen.. 1 by mouth once daily 2)  Trazodone Hcl 50 Mg Tabs (Trazodone hcl) .Marland Kitchen.. 1 by mouth at bedtime as needed 3)  Tramadol Hcl 50 Mg Tabs (Tramadol hcl) .Marland Kitchen.. 1 by mouth q 6 hrs as needed 4)  Flexeril 5 Mg Tabs (Cyclobenzaprine hcl) .Marland Kitchen.. 1 by mouth three times a day as needed spasm  Patient Instructions: 1)  Please take all new medications as prescribed 2)  Continue all previous medications as before this visit  3)  Please schedule a follow-up appointment as  needed. Prescriptions: FLEXERIL 5 MG TABS (CYCLOBENZAPRINE HCL) 1 by mouth three times a day as needed spasm  #60 x 2   Entered and Authorized by:   Corwin Levins MD   Signed by:   Corwin Levins MD on 08/07/2009   Method used:   Print then Give to Patient   RxID:   [id number] TRAMADOL HCL 50 MG TABS (TRAMADOL HCL) 1 by mouth q 6 hrs as needed  #60 x 2   Entered and Authorized by:  Corwin Levins MD   Signed by:   Corwin Levins MD on 08/07/2009   Method used:   Print then Give to Patient   RxID:   [id number] TRAZODONE HCL 50 MG TABS (TRAZODONE HCL) 1 by mouth at bedtime as needed  #30 x 5   Entered and Authorized by:   Corwin Levins MD   Signed by:   Corwin Levins MD on 08/07/2009   Method used:   Print then Give to Patient   RxID:   [id number]

## 2010-08-06 NOTE — Progress Notes (Signed)
Summary: Zolpidem refill  Phone Note Refill Request Message from:  Fax from Pharmacy on July 16, 2009 8:19 AM  Refills Requested: Medication #1:  ZOLPIDEM TARTRATE 10 MG TABS 1 by mouth at bedtime as needed Initial call taken by: Lucious Groves,  July 16, 2009 8:19 AM  Follow-up for Phone Call        done hardcopy to [facility] - dahlia  Follow-up by: Corwin Levins MD,  July 16, 2009 9:18 AM  Additional Follow-up for Phone Call Additional follow up Details #1::        to [facility] Additional Follow-up by: Margaret Pyle, CMA,  July 16, 2009 9:27 AM    Additional Follow-up for Phone Call Additional follow up Details #2::    phoned to pharmacy.  Follow-up by: Lucious Groves,  July 16, 2009 9:29 AM  Prescriptions: ZOLPIDEM TARTRATE 10 MG TABS (ZOLPIDEM TARTRATE) 1 by mouth at bedtime as needed  #30 x 3   Entered and Authorized by:   Corwin Levins MD   Signed by:   Corwin Levins MD on 07/16/2009   Method used:   Print then Give to Patient   RxID:   [id number]

## 2010-08-06 NOTE — Assessment & Plan Note (Signed)
Summary: MED CHANGE/NWS   Vital Signs:  Patient profile:   30 year old female Height:      68 inches Weight:      191.50 pounds BMI:     29.22 O2 Sat:      98 % on Room air Temp:     98.4 degrees F oral Pulse rate:   99 / minute BP sitting:   120 / 80  (right arm) Cuff size:   regular  Vitals Entered By: Zella Ball Ewing CMA (AAMA) (January 14, 2010 3:22 PM)  O2 Flow:  Room air CC: Discuss medications/RE   CC:  Discuss medications/RE.  History of Present Illness: here with years of pain, worse in the past 2 wks, starts to bilat lower back,worse on the right, some on the left, radiates to right lat hip and lateral thigh, not below the knee, no numbness or weakness,  no bowel or bladder change,  no gait change or falls though she thinks but husband remarked she seemed to be limping a few days ago;  no fever, no wt loss,  night sweats; no hx of injury to the lower back, but did hurt the neck in gym class at 30 yo - wore neck brace a few days.    also with ongoing nasall allergy symtpoms and ins will not pay for the allegra generic  overall sleep doing well, denies worsening depressive symtpoms, suicidal indeation or stress/ panic  Problems Prior to Update: 1)  Back Pain  (ICD-724.5) 2)  Allergic Rhinitis  (ICD-477.9) 3)  Renal Calculus, Right  (ICD-592.0) 4)  Hydronephrosis, Right  (ICD-591) 5)  Flank Pain, Right  (ICD-789.09) 6)  Uri  (ICD-465.9) 7)  Ankle Pain, Right  (ICD-719.47) 8)  Insomnia-sleep Disorder-unspec  (ICD-780.52) 9)  Back Pain  (ICD-724.5) 10)  Glucose Intolerance  (ICD-271.3) 11)  Common Migraine  (ICD-346.10) 12)  Nephrolithiasis, Hx of  (ICD-V13.01)  Medications Prior to Update: 1)  Ortho Tri-Cyclen Lo 0.18/0.215/0.25 Mg-25 Mcg Tabs (Norgestim-Eth Estrad Triphasic) .Marland Kitchen.. 1 By Mouth Once Daily 2)  Zolpidem Tartrate 10 Mg Tabs (Zolpidem Tartrate) .Marland Kitchen.. 1po At Bedtime As Needed 3)  Hydrocodone-Acetaminophen 7.5-325 Mg Tabs (Hydrocodone-Acetaminophen) .Marland Kitchen.. 1 By  Mouth Q 6 Hrs As Needed Pain 4)  Carisoprodol 350 Mg Tabs (Carisoprodol) .Marland Kitchen.. 1 By Mouth Four Times Per Day As Needed 5)  Fexofenadine Hcl 180 Mg Tabs (Fexofenadine Hcl) .Marland Kitchen.. 1po Once Daily As Needed Allergies (Generic Allegra) 6)  Prednisone 10 Mg Tabs (Prednisone) .... 4po Qd For 3days, Then 3po Qd For 3days, Then 2po Qd For 3days, Then 1po Qd For 3 Days, Then Stop  Current Medications (verified): 1)  Ortho Tri-Cyclen Lo 0.18/0.215/0.25 Mg-25 Mcg Tabs (Norgestim-Eth Estrad Triphasic) .Marland Kitchen.. 1 By Mouth Once Daily 2)  Zolpidem Tartrate 10 Mg Tabs (Zolpidem Tartrate) .Marland Kitchen.. 1po At Bedtime As Needed 3)  Hydrocodone-Acetaminophen 7.5-325 Mg Tabs (Hydrocodone-Acetaminophen) .Marland Kitchen.. 1 By Mouth Q 6 Hrs As Needed Pain 4)  Carisoprodol 350 Mg Tabs (Carisoprodol) .Marland Kitchen.. 1 By Mouth Four Times Per Day As Needed 5)  Levocetirizine Dihydrochloride 5 Mg Tabs (Levocetirizine Dihydrochloride) .Marland Kitchen.. 1po Once Daily As Needed 6)  Prednisone 10 Mg Tabs (Prednisone) .... 4po Qd For 3days, Then 3po Qd For 3days, Then 2po Qd For 3days, Then 1po Qd For 3 Days, Then Stop  Allergies (verified): 1)  ! Nsaids 2)  ! Penicillin 3)  ! Augmentin 4)  ! Ibuprofen 5)  ! 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   Head:  normocephalic and atraumatic.     Eyes:  vision grossly intact, pupils equal, and pupils round.   Ears:  R ear normal and L ear normal.   Nose:  nasal dischargemucosal pallor and mucosal edema.   Mouth:  no gingival abnormalities and pharynx pink and moist.   Neck:  supple and no masses.   Lungs:  normal respiratory effort and normal breath sounds.   Heart:  normal rate and regular rhythm.   Abdomen:  soft, non-tender, and normal bowel sounds.   Msk:  has upper lumbar midline tender approx l1/2 levels;  also has mod right lateral paravertebral tender and midl SI joint area tender;  no lateral hip or leg tender , and no pain elicited on right hip ROM testing Extremities:  no edema, no erythema  Neurologic:  strength normal in all extremities, sensation intact to light touch, gait normal, and DTRs symmetrical and normal.   Psych:  not anxious appearing and not depressed appearing.     Impression & Recommendations:  Problem # 1:  BACK PAIN (ICD-724.5)  Her updated medication list for this problem includes:    Hydrocodone-acetaminophen 7.5-325 Mg Tabs (Hydrocodone-acetaminophen) .Marland Kitchen... 1 by mouth q 6 hrs as needed pain    Carisoprodol 350 Mg Tabs (Carisoprodol) .Marland Kitchen... 1 by mouth four times per day as needed pt is very young, but hx seems c/w DDD or DJD flare vs other unusual to the lumbar with mild tender midline and laterally, currently afeb but will need LS Spine MRI due to chronicity now worse . treat as above, f/u any worsening signs or symptoms , as well as prednisone pack  Orders: Radiology Referral (Radiology)  Problem # 2:  ALLERGIC RHINITIS (ICD-477.9)  Her updated medication list for this problem includes:    Levocetirizine Dihydrochloride 5 Mg Tabs (Levocetirizine dihydrochloride) .Marland Kitchen... 1po once daily as needed treat as above, f/u any worsening signs or symptoms   .   Problem # 3:  INSOMNIA-SLEEP DISORDER-UNSPEC (ICD-780.52)  Her updated medication list for this problem includes:    Zolpidem Tartrate 10 Mg  Tabs (Zolpidem tartrate) .Marland Kitchen... 1po at bedtime as needed stable overall by hx and exam, ok to continue meds/tx as is   Complete Medication List: 1)  Ortho Tri-cyclen Lo 0.18/0.215/0.25 Mg-25 Mcg Tabs (Norgestim-eth estrad triphasic) .Marland Kitchen.. 1 by mouth once daily 2)  Zolpidem Tartrate 10 Mg Tabs (Zolpidem tartrate) .Marland Kitchen.. 1po at bedtime as needed 3)  Hydrocodone-acetaminophen 7.5-325 Mg Tabs (Hydrocodone-acetaminophen) .Marland Kitchen.. 1 by mouth q 6 hrs as needed pain 4)  Carisoprodol 350 Mg Tabs (Carisoprodol) .Marland Kitchen.. 1 by mouth four times per day as needed 5)  Levocetirizine Dihydrochloride 5 Mg Tabs (Levocetirizine dihydrochloride) .Marland Kitchen.. 1po once daily as needed 6)  Prednisone 10 Mg Tabs (Prednisone) .... 4po qd for 3days, then 3po qd for 3days, then 2po qd for 3days, then 1po qd for 3 days,  then stop  Patient Instructions: 1)  Please take all new medications as prescribed 2)  Continue all previous medications as before this visit  3)  You will be contacted about the referral(s) to: MRI for the lower back 4)  Please schedule a follow-up appointment as needed. Prescriptions: HYDROCODONE-ACETAMINOPHEN 7.5-325 MG TABS (HYDROCODONE-ACETAMINOPHEN) 1 by mouth q 6 hrs as needed pain  #60 x 1   Entered and Authorized by:   Corwin Levins MD   Signed by:   Corwin Levins MD on 01/14/2010   Method used:   Print then Give to Patient   RxID:   [id number] CARISOPRODOL 350 MG TABS (CARISOPRODOL) 1 by mouth four times per day as needed  #60 x 2   Entered and Authorized by:   Corwin Levins MD   Signed by:   Corwin Levins MD on 01/14/2010   Method used:   Print then Give to Patient   RxID:   [id number] PREDNISONE 10 MG TABS (PREDNISONE) 4po qd for 3days, then 3po qd for 3days, then 2po qd for 3days, then 1po qd for 3 days, then stop  #30 x 0   Entered and Authorized by:   Corwin Levins MD   Signed by:   Corwin Levins MD on 01/14/2010   Method used:   Print then Give to Patient   RxID:    [id number] LEVOCETIRIZINE DIHYDROCHLORIDE 5 MG TABS (LEVOCETIRIZINE DIHYDROCHLORIDE) 1po once daily as needed  #30 x 11   Entered and Authorized by:   Corwin Levins MD   Signed by:   Corwin Levins MD on 01/14/2010   Method used:   [facility]       [address]       [place], [place]  [address]       Ph: [phone]       Fax: [phone]   RxID:   [id number]

## 2010-08-06 NOTE — Progress Notes (Signed)
Summary: Hydrocodone  Phone Note Call from Patient   Caller: Patient [phone] Summary of Call: Pt called requesting refills of Hydrocodone.  [facility] [place]  Initial call taken by: Margaret Pyle, CMA,  February 13, 2010 10:06 AM  Follow-up for Phone Call        has she been able to get the MRI done? I have not seen results Follow-up by: Corwin Levins MD,  February 13, 2010 1:11 PM  Additional Follow-up for Phone Call Additional follow up Details #1::        Per notes in Radiology referral a peer to peer wan needed to approve procedure. It says that JWJ wanted to hold off on MRI and see if medications would help. Additional Follow-up by: Margaret Pyle, CMA,  February 13, 2010 1:20 PM    Additional Follow-up for Phone Call Additional follow up Details #2::    ok for med refill - done hardcopy to [facility] - dahlia , but will also need ortho referral for persistent pain - I will do Follow-up by: Corwin Levins MD,  February 13, 2010 1:23 PM  Additional Follow-up for Phone Call Additional follow up Details #3:: Details for Additional Follow-up Action Taken: Rx faxed to pharmacy Additional Follow-up by: Margaret Pyle, CMA,  February 13, 2010 1:31 PM  Prescriptions: HYDROCODONE-ACETAMINOPHEN 7.5-325 MG TABS (HYDROCODONE-ACETAMINOPHEN) 1 by mouth q 6 hrs as needed pain  #60 x 0   Entered and Authorized by:   Corwin Levins MD   Signed by:   Corwin Levins MD on 02/13/2010   Method used:   Print then Give to Patient   RxID:   [id number]

## 2010-08-06 NOTE — Progress Notes (Signed)
Summary: Allergy/ Referral  Phone Note Call from Patient   Caller: Patient [phone] Summary of Call: Pt called stating that she had a allergic reaction to latex but this time it was airborne, room full of ballons. Pt's reactions caused SOB and an ambalance had to be called. Once pt was removed from room and placed on O2 SOB improved. Pt was also advised to get and Rx for an Epi Pen.  Pt is requesting a referral to specialist that can do more extensive allergy testing on her.  Per pt she has always had latex allergy, causes rash and hives. Initial call taken by: Margaret Pyle, CMA,  April 11, 2010 9:32 AM  Follow-up for Phone Call        ok for referral for ? of desensitization ? 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 Nsaids 2)  ! Penicillin 3)  ! Augmentin 4)  ! Ibuprofen 5)  ! Aspirin 6)  * Latex

## 2010-08-06 NOTE — Progress Notes (Signed)
Summary: Rx req  Phone Note Call from Patient Call back at Home Phone [phone]   Caller: Patient Summary of Call: Pt called requesting a refill of Tramadol, medication was removed from med list 10/11/2009. Okay to add and refill? Initial call taken by: Margaret Pyle, CMA,  April 08, 2010 9:04 AM  Follow-up for Phone Call        done hardcopy to [facility] - dahlia  Follow-up by: Corwin Levins MD,  April 08, 2010 1:06 PM  Additional Follow-up for Phone Call Additional follow up Details #1::        Pt informed via VM Additional Follow-up by: Margaret Pyle, CMA,  April 08, 2010 1:07 PM    New/Updated Medications: TRAMADOL HCL 50 MG TABS (TRAMADOL HCL) 1 by mouth q 6 hrs as needed pain Prescriptions: TRAMADOL HCL 50 MG TABS (TRAMADOL HCL) 1 by mouth q 6 hrs as needed pain  #60 x 2   Entered and Authorized by:   Corwin Levins MD   Signed by:   Corwin Levins MD on 04/08/2010   Method used:   Electronically to        [facility] (retail)       [address]       [place], [place]  [address]       Ph: [phone]       Fax: [phone]   RxID:   [id number]

## 2010-08-06 NOTE — Letter (Signed)
Summary: [facility]  [address]   [place], [place] [address]   Phone: [phone]  Fax: [phone]    05/24/2010  [facility] [address] [place], [place]  [address]   To Whom it may concern:             This letter is to inform you that [name] is required/needs to avoid latex in gloves and balloons due to an allergy. She requires Polypropylene gloves. Thanks for accommodating [name] with these needs. If you have any questions or concerns please call our office at [phone].          Sincerely,      Jason Coop

## 2010-08-06 NOTE — Letter (Signed)
Summary: Out of Work  [facility]  [address] [place], [place] [address]   Phone: [phone]  Fax: [phone]    Nov 22, 2009   Employee:  Ryli Julius    To Whom It May Concern:   For Medical reasons, please excuse the above named employee from work for the following dates:  Start:   Nov 22, 2009 (afternoon)   End:   Nov 25, 2009   ---   to return to work Nov 26, 2009 without restriction  If you need additional information, please feel free to contact our office.         Sincerely,    Corwin Levins MD

## 2010-08-06 NOTE — Progress Notes (Signed)
Summary: Rx refill req  Phone Note Refill Request Message from:  Patient on May 20, 2010 11:06 AM  Refills Requested: Medication #1:  CARISOPRODOL 350 MG TABS 1 by mouth four times per day as needed   Dosage confirmed as above?Dosage Confirmed   Supply Requested: 1 month  Method Requested: Electronic Initial call taken by: Margaret Pyle, CMA,  May 20, 2010 11:06 AM  Follow-up for Phone Call        ok for limited rx only - please inform pt this is the last as this medication is not meant to be long term Follow-up by: Corwin Levins MD,  May 20, 2010 12:16 PM  Additional Follow-up for Phone Call Additional follow up Details #1::        Pt informed via, [facility] Additional Follow-up by: Margaret Pyle, CMA,  May 20, 2010 1:01 PM    New/Updated Medications: CARISOPRODOL 350 MG TABS (CARISOPRODOL) 1 by mouth four times per day as needed Prescriptions: CARISOPRODOL 350 MG TABS (CARISOPRODOL) 1 by mouth four times per day as needed  #60 x 0   Entered and Authorized by:   Corwin Levins MD   Signed by:   Corwin Levins MD on 05/20/2010   Method used:   Print then Give to Patient   RxID:   [id number]

## 2010-08-08 NOTE — Assessment & Plan Note (Signed)
Summary: ALLERGY TESTING ///KP   Vital Signs:  Patient profile:   30 year old female Height:      68 inches Weight:      188 pounds BMI:     28.69 O2 Sat:      94 % on Room air Pulse rate:   69 / minute BP sitting:   122 / 80  (left arm) Cuff size:   regular  Vitals Entered By: Reynaldo Minium CMA (June 12, 2010 4:05 PM)  O2 Flow:  Room air CC: Allergy Skin Testing   Primary Provider/Referring Provider:  Oliver Barre  CC:  Allergy Skin Testing.  History of Present Illness: History of Present Illness: May 24, 2010- 29 yoF referred by Dr Jonny Ruiz for allergy evaluation. 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 She describes contact latex dermatitis for many years. Nitrile gloves now also give contact irritation on her hands so she is using polypropylene gloves.  5 weeks ago balloons were brought into her work area. She began itching and chest got tight. Took benadryl, but she says she got more short of breath then woke with paramedics around her, but didn't need to go to hospital.  Hx of skin testing age 9, but doesn't describe allergy vaccine then. Cats cause eyes to swell. Seasonal allergic rhinitis has been unusually bad this Fall. Asthma as a young child. Pneumonia 5 years ago. Strong local reaction to fire ant stings while in [place] of [place].  Aspirin makes eyes itch and swell- no hx of polyps. Last prednisone 6 months ago.   June 12, 2010- Latex allergy, Allergic rhinitis She avoids latex appropriately.  For allergy skin testing Head stuffy outrdoors. Continues to avoid latex.  [facility] labwork- Not received yet- we are calling.  Skin test- Pos, weak- grass, tree, dust, cat/ dog  Preventive Screening-Counseling & Management  Alcohol-Tobacco     Smoking Status: quit     Packs/Day: 1.0     Year Started: smoked for approx 8-10 years     Year Quit: 2011  Current Medications (verified): 1)  Zolpidem Tartrate 10 Mg Tabs  (Zolpidem Tartrate) .Marland Kitchen.. 1po At Bedtime As Needed 2)  Tramadol Hcl 50 Mg Tabs (Tramadol Hcl) .Marland Kitchen.. 1 By Mouth Q 6 Hrs As Needed Pain 3)  Calcium 1500 Mg Tabs (Calcium Carbonate) .... Take 1 By Mouth Once Daily 4)  Epipen 2-Pak 0.3 Mg/0.49ml Devi (Epinephrine) .... For Severe Allergic Reaction 5)  Flonase 50 Mcg/act Susp (Fluticasone Propionate) .Marland Kitchen.. 1-2 Sprays Each Nostril Every Night At Bedtime During Allergy Season  Allergies (verified): 1)  ! Nsaids 2)  ! Penicillin 3)  ! Augmentin 4)  ! Ibuprofen 5)  ! 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  The patient denies shortness of breath with activity, shortness of breath at rest, productive cough, non-productive cough, coughing up blood, chest pain, irregular heartbeats, acid heartburn, indigestion, loss of appetite, weight change, abdominal pain, difficulty swallowing, sore throat, tooth/dental problems, headaches, ear ache, rash, change in color of mucus, and fever.    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 She keeps an Epi Pen as discussed again.   Problem # 2:  ALLERGIC RHINITIS (ICD-477.9)  Discussed environmental precautions, decongestants and treatment options.  She is trying a nasal steroid now and we discussed allergy vaccine. This is a potential option for iher in the future, but we agree she should give environmental precautions and symptomatic treatments a more thourough trial first.  Her updated medication list for this problem includes:    Flonase 50 Mcg/act Susp (Fluticasone propionate) .Marland Kitchen... 1-2 sprays each nostril every night at bedtime during allergy season  Orders: Est. Patient Level III (45409)  Patient Instructions: 1)  Please schedule a follow-up appointment as needed. 2)  Consider the dust precaution measures we discussed.    Orders Added: 1)  Est. Patient Level III [81191]     Appended Document: Orders Update    Clinical Lists Changes  Orders: Added new Service order of Allergy Puncture Test (47829) - Signed Added new Service order of Allergy I.D Test (56213) - Signed

## 2010-08-08 NOTE — Progress Notes (Signed)
----   Converted from flag ---- ---- 07/16/2010 1:55 PM, Corwin Levins MD wrote: referral done  ---- 07/16/2010 1:55 PM, Zella Ball Ewing CMA (AAMA) wrote: called the pt. and she does want a orthopedic referral and requested it to be in [facility]  ---- 07/16/2010 1:22 PM, Corwin Levins MD wrote: please call pt - insurance has denies MRI, does she want orthopedic referral?  ---- 07/16/2010 9:30 AM, Shelbie Proctor wrote: Dr Juluis Pitch requesting physican -to Physican dissussion to complete the autorization radiology request for  Procedure: MRI LS Spine - no CM [phone] ortion #4  PT ID# [id number] case #[id number] cpt code [id number] pt insurance has not approved this and appt has not be made the fax on your cart ------------------------------

## 2010-08-08 NOTE — Miscellaneous (Signed)
Summary: Allergy Skin Test / [facility]  Allergy Skin Test / [facility]   Imported By: Lennie Odor 06/20/2010 12:07:11  _____________________________________________________________________  External Attachment:    Type:   Image     Comment:   External Document

## 2010-08-08 NOTE — Miscellaneous (Signed)
Summary: Orders Update  Clinical Lists Changes  Orders: Added new Referral order of Orthopedic Surgeon Referral (Ortho Surgeon) - Signed 

## 2010-08-08 NOTE — Assessment & Plan Note (Signed)
Summary: back--hip pain/cd   Vital Signs:  Patient profile:   30 year old female Height:      68 inches Weight:      182.25 pounds BMI:     27.81 O2 Sat:      96 % on Room air Temp:     98.4 degrees F oral Pulse rate:   81 / minute BP sitting:   118 / 80  (left arm) Cuff size:   large  Vitals Entered By: Margaret Pyle, CMA (July 11, 2010 11:08 AM)  O2 Flow:  Room air CC: Hip and back pain   Primary Care Provider:  Oliver Barre  CC:  Hip and back pain.  History of Present Illness: here with recurrent flare of lower back pain similar as per last HPI, but now severe for 3 days, with increased pain radiating to right thigh, as well as newly to left thigh (right > left),  no fever, wt loss, other worsening LE weakness or numbness, gait change or fall; worse to get up from sitting or bending;  no better with anything;  current meds not working, no falls or injury.  Also with increased sleep difficulty even before the pain worsening, Denies worsening depressive symptoms, suicidal ideation, or panic.   No fever, wt loss, night sweats, loss of appetite or other constitutional symptoms  Pt denies polydipsia, polyuria.  Does have mild ongoing nasal allergy sympotms over the past few wks as well, without fever, pain, colored d/c, earache or ST.  Preventive Screening-Counseling & Management      Drug Use:  no.    Problems Prior to Update: 1)  *** Latex Allergy ***  (ICD-V15.07) 2)  Back Pain  (ICD-724.5) 3)  Allergic Rhinitis  (ICD-477.9) 4)  Renal Calculus, Right  (ICD-592.0) 5)  Hydronephrosis, Right  (ICD-591) 6)  Flank Pain, Right  (ICD-789.09) 7)  Uri  (ICD-465.9) 8)  Ankle Pain, Right  (ICD-719.47) 9)  Insomnia-sleep Disorder-unspec  (ICD-780.52) 10)  Back Pain  (ICD-724.5) 11)  Glucose Intolerance  (ICD-271.3) 12)  Common Migraine  (ICD-346.10) 13)  Nephrolithiasis, Hx of  (ICD-V13.01)  Medications Prior to Update: 1)  Zolpidem Tartrate 10 Mg Tabs (Zolpidem  Tartrate) .Marland Kitchen.. 1po At Bedtime As Needed 2)  Tramadol Hcl 50 Mg Tabs (Tramadol Hcl) .Marland Kitchen.. 1 By Mouth Q 6 Hrs As Needed Pain 3)  Calcium 1500 Mg Tabs (Calcium Carbonate) .... Take 1 By Mouth Once Daily 4)  Epipen 2-Pak 0.3 Mg/0.43ml Devi (Epinephrine) .... For Severe Allergic Reaction 5)  Flonase 50 Mcg/act Susp (Fluticasone Propionate) .Marland Kitchen.. 1-2 Sprays Each Nostril Every Night At Bedtime During Allergy Season  Current Medications (verified): 1)  Zolpidem Tartrate 12.5 Mg Cr-Tabs (Zolpidem Tartrate) .Marland Kitchen.. 1po At Bedtime As Needed 2)  Tramadol Hcl 50 Mg Tabs (Tramadol Hcl) .Marland Kitchen.. 1-2  By Mouth Q 6 Hrs As Needed Pain 3)  Calcium 1500 Mg Tabs (Calcium Carbonate) .... Take 1 By Mouth Once Daily 4)  Epipen 2-Pak 0.3 Mg/0.31ml Devi (Epinephrine) .... For Severe Allergic Reaction 5)  Flonase 50 Mcg/act Susp (Fluticasone Propionate) .Marland Kitchen.. 1-2 Sprays Each Nostril Every Night At Bedtime During Allergy Season 6)  Hydrocodone-Acetaminophen 5-325 Mg Tabs (Hydrocodone-Acetaminophen) .Marland Kitchen.. 1po Q 6 Hrs As Needed For Breakthrough Pain 7)  Tizanidine Hcl 4 Mg Tabs (Tizanidine Hcl) .Marland Kitchen.. 1po Q 6 Hrs As Needed  Allergies (verified): 1)  ! Nsaids 2)  ! Penicillin 3)  ! Augmentin 4)  ! Ibuprofen 5)  ! 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   Head:  normocephalic and atraumatic.   Eyes:  vision grossly intact, pupils equal, and pupils round.   Ears:  R ear normal and L ear normal.   Nose:  nasal dischargemucosal pallor and mucosal edema.   Mouth:  no gingival abnormalities and pharynx pink and moist.   Neck:  supple and no masses.   Lungs:  normal respiratory effort and normal breath sounds.   Heart:  normal rate and regular rhythm.   Abdomen:  soft, non-tender, and normal bowel sounds.   Msk:  has upper lumbar midline tender approx l1/2 levels;  also has mod right lateral paravertebral tender and midl SI joint area tender;  no lateral hip or leg tender , and no pain elicited on right hip ROM testing Extremities:  no edema, no erythema  Neurologic:  strength normal in all extremities, sensation intact to light touch, gait normal, and DTRs symmetrical and normal.     Impression & Recommendations:  Problem # 1:  BACK PAIN (ICD-724.5)  Her updated medication list for this problem includes:    Tramadol Hcl 50 Mg Tabs (Tramadol hcl) .Marland Kitchen... 1-2  by mouth q 6 hrs as needed pain    Hydrocodone-acetaminophen 5-325 Mg Tabs (Hydrocodone-acetaminophen) .Marland Kitchen... 1po q 6 hrs as needed for breakthrough pain    Tizanidine Hcl 4 Mg Tabs (Tizanidine hcl) .Marland Kitchen... 1po q 6 hrs as needed for limited vicodin for now, but long term for tramadol as needed;  add the zanaflex as needed as well, with persistent and worsening and persitent symptoms since last visit will need MRI  Orders: Radiology Referral (Radiology)  Problem # 2:  INSOMNIA-SLEEP DISORDER-UNSPEC (ICD-780.52)  Her updated medication list for this problem includes:    Zolpidem Tartrate 12.5 Mg Cr-tabs (Zolpidem tartrate) .Marland Kitchen... 1po at bedtime as  needed treat as above, f/u any worsening signs or symptoms   Problem # 3:  GLUCOSE INTOLERANCE (ICD-271.3) asympt - Pt to cont diet, excercise, wt control efforts; declines labs today  Problem # 4:  ALLERGIC RHINITIS (ICD-477.9)  Her updated medication list for this problem includes:    Flonase 50 Mcg/act Susp (Fluticasone propionate) .Marland Kitchen... 1-2 sprays each nostril every night at bedtime during allergy season to re-start med - treat as above, f/u any worsening signs or symptoms   Complete Medication List: 1)  Zolpidem Tartrate 12.5 Mg Cr-tabs (Zolpidem tartrate) .Marland Kitchen.. 1po at bedtime as needed 2)  Tramadol Hcl 50 Mg Tabs (Tramadol hcl) .Marland Kitchen.. 1-2  by mouth q 6 hrs as needed pain 3)  Calcium 1500 Mg Tabs (Calcium carbonate) .... Take 1 by  mouth once daily 4)  Epipen 2-pak 0.3 Mg/0.93ml Devi (Epinephrine) .... For severe allergic reaction 5)  Flonase 50 Mcg/act Susp (Fluticasone propionate) .Marland Kitchen.. 1-2 sprays each nostril every night at bedtime during allergy season 6)  Hydrocodone-acetaminophen 5-325 Mg Tabs (Hydrocodone-acetaminophen) .Marland Kitchen.. 1po q 6 hrs as needed for breakthrough pain 7)  Tizanidine Hcl 4 Mg Tabs (Tizanidine hcl) .Marland Kitchen.. 1po q 6 hrs as needed  Patient Instructions: 1)  Please take all new medications as prescribed 2)  Continue all previous medications as before this visit  3)  You will be contacted about the referral(s) to: MRI for the lower back 4)  depending on the results, you may need to see Dr Darrelyn Hillock again 5)  Please call the number on the [facility] Card for results of your testing  6)  Please schedule a follow-up appointment as needed. Prescriptions: HYDROCODONE-ACETAMINOPHEN 5-325 MG TABS (HYDROCODONE-ACETAMINOPHEN) 1po q 6 hrs as needed for breakthrough pain  #40 x 0   Entered and Authorized by:   Corwin Levins MD   Signed by:   Corwin Levins MD on 07/11/2010   Method used:   Print then Give to Patient   RxID:   [id number] TIZANIDINE HCL 4 MG TABS (TIZANIDINE HCL) 1po q 6  hrs as needed  #60 x 1   Entered and Authorized by:   Corwin Levins MD   Signed by:   Corwin Levins MD on 07/11/2010   Method used:   Print then Give to Patient   RxID:   [id number] ZOLPIDEM TARTRATE 12.5 MG CR-TABS (ZOLPIDEM TARTRATE) 1po at bedtime as needed  #30 x 5   Entered and Authorized by:   Corwin Levins MD   Signed by:   Corwin Levins MD on 07/11/2010   Method used:   Print then Give to Patient   RxID:   [id number] HYDROCODONE-ACETAMINOPHEN 5-325 MG TABS (HYDROCODONE-ACETAMINOPHEN) 1po q 6 hrs as needed for breakthrough pain  #04 x 0   Entered and Authorized by:   Corwin Levins MD   Signed by:   Corwin Levins MD on 07/11/2010   Method used:   Print then Give to Patient   RxID:   [id number] TRAMADOL HCL 50 MG TABS (TRAMADOL HCL) 1-2  by mouth q 6 hrs as needed pain  #120 x 3   Entered and Authorized by:   Corwin Levins MD   Signed by:   Corwin Levins MD on 07/11/2010   Method used:   Print then Give to Patient   RxID:   [id number]    Orders Added: 1)  Radiology Referral [Radiology] 2)  Est. Patient Level IV [63016]

## 2010-08-08 NOTE — Progress Notes (Signed)
Summary: tramadol  Phone Note Call from Patient Call back at Home Phone [phone]   Caller: Patient Summary of Call: Pt requesting refill on Tramadol to get her through the holidays because of hip pain per pt-please advise Initial call taken by: Brenton Grills CMA Duncan Dull),  June 26, 2010 10:32 AM  Follow-up for Phone Call        ok for tramadol - done per emr Follow-up by: Corwin Levins MD,  June 26, 2010 12:16 PM  Additional Follow-up for Phone Call Additional follow up Details #1::        pt informed via VM  Additional Follow-up by: Brenton Grills CMA Duncan Dull),  June 26, 2010 1:43 PM    New/Updated Medications: TRAMADOL HCL 50 MG TABS (TRAMADOL HCL) 1 by mouth q 6 hrs as needed pain Prescriptions: TRAMADOL HCL 50 MG TABS (TRAMADOL HCL) 1 by mouth q 6 hrs as needed pain  #60 x 1   Entered and Authorized by:   Corwin Levins MD   Signed by:   Corwin Levins MD on 06/26/2010   Method used:   Electronically to        [facility]       [address]       [place], [place]  [address]       Ph: [phone]       Fax: [phone]   RxID:   [id number]

## 2010-08-09 ENCOUNTER — Encounter: Payer: Self-pay | Admitting: Internal Medicine

## 2010-08-19 ENCOUNTER — Ambulatory Visit: Payer: Self-pay | Admitting: Orthopedic Surgery

## 2010-09-03 NOTE — Consult Note (Signed)
Summary: Kathreen Devoid MD/[facility]  Kathreen Devoid MD/[facility]   Imported By: Lester [place] 08/30/2010 10:02:34  _____________________________________________________________________  External Attachment:    Type:   Image     Comment:   External Document

## 2010-09-04 ENCOUNTER — Telehealth: Payer: Self-pay | Admitting: Internal Medicine

## 2010-09-12 NOTE — Progress Notes (Signed)
Summary: med refill  Phone Note Refill Request Message from:  Fax from Pharmacy on September 04, 2010 11:57 AM  Refills Requested: Medication #1:  HYDROCODONE-ACETAMINOPHEN 5-325 MG TABS 1po q 6 hrs as needed for breakthrough pain   Dosage confirmed as above?Dosage Confirmed   Last Refilled: 07/11/2010   Notes: [employer] [employer]. [place], [phone] Initial call taken by: Robin Ewing CMA Duncan Dull),  September 04, 2010 11:58 AM  Follow-up for Phone Call        i'm not sure that we should continue this med, as pt was supposed to see orthopedic, and was referred for MRI after being seen feb 3  has she been able to get the MRI and f/u with ortho? Follow-up by: Corwin Levins MD,  September 04, 2010 12:58 PM  Additional Follow-up for Phone Call Additional follow up Details #1::        called pt left msg. to call back Additional Follow-up by: Robin Ewing CMA Duncan Dull),  September 04, 2010 1:42 PM    Additional Follow-up for Phone Call Additional follow up Details #2::    called the pt. and she did see Dr. Martha Clan in [place] and had 2 MRI's, one on the spine and pelvis. She is starting PT on friday for 8 weeks. She is still having pain and felt that due to PT would need the medication. She also is needing the Tizanidine refilled has 2 to 3 left. Follow-up by: Zella Ball Ewing CMA Duncan Dull),  September 04, 2010 2:28 PM  Additional Follow-up for Phone Call Additional follow up Details #3:: Details for Additional Follow-up Action Taken: ok for one more rx, but I think should likely not need further after that Additional Follow-up by: Corwin Levins MD,  September 04, 2010 2:36 PM  New/Updated Medications: HYDROCODONE-ACETAMINOPHEN 5-325 MG TABS (HYDROCODONE-ACETAMINOPHEN) 1po q 6 hrs as needed for breakthrough pain Prescriptions: HYDROCODONE-ACETAMINOPHEN 5-325 MG TABS (HYDROCODONE-ACETAMINOPHEN) 1po q 6 hrs as needed for breakthrough pain  #50 x 0   Entered and Authorized by:   Corwin Levins MD  Signed by:   Corwin Levins MD on 09/04/2010   Method used:   Print then Give to Patient   RxID:   [id number] TIZANIDINE HCL 4 MG TABS (TIZANIDINE HCL) 1po q 6 hrs as needed  #60 x 1   Entered and Authorized by:   Corwin Levins MD   Signed by:   Corwin Levins MD on 09/04/2010   Method used:   Print then Give to Patient   RxID:   [id number]   Appended Document: med refill called pt. left msg. sent refills to pharmacy

## 2010-10-09 ENCOUNTER — Other Ambulatory Visit: Payer: Self-pay

## 2010-10-09 NOTE — Telephone Encounter (Signed)
Pt called requesting Rx to new pharmacy.  Rxs written 07/11/2010 Ultram #120 x 3 1-2 po q6h prn pain and Ambien 1 po qhs prn #30 x 5

## 2010-10-09 NOTE — Telephone Encounter (Signed)
Too soon for both  Centricity shows 6 mo total ambien prescribed Jul 11 2010  And tramadol 4 months starting jan 5

## 2010-10-10 MED ORDER — ZOLPIDEM TARTRATE ER 12.5 MG PO TBCR: 12.5000 mg | EXTENDED_RELEASE_TABLET | Freq: Every evening | ORAL | Status: DC | PRN

## 2010-10-10 MED ORDER — TRAMADOL HCL 50 MG PO TABS: 50.0000 mg | ORAL_TABLET | Freq: Four times a day (QID) | ORAL | Status: AC | PRN

## 2010-10-10 NOTE — Telephone Encounter (Signed)
Both rx Done hardcopy to dahlia/[facility]

## 2010-10-10 NOTE — Telephone Encounter (Signed)
Rx faxed to pharmacy per pt request 

## 2010-10-10 NOTE — Telephone Encounter (Signed)
Pt states she is changing pharmacies and if Rxs are transferred she will lose her refills.

## 2010-10-23 ENCOUNTER — Ambulatory Visit: Payer: Self-pay | Admitting: Urology

## 2010-10-23 ENCOUNTER — Emergency Department: Payer: Self-pay | Admitting: Emergency Medicine

## 2010-10-29 ENCOUNTER — Ambulatory Visit: Payer: Self-pay | Admitting: Urology

## 2010-10-30 ENCOUNTER — Emergency Department: Payer: Self-pay | Admitting: Emergency Medicine

## 2010-10-31 ENCOUNTER — Encounter: Payer: Self-pay | Admitting: Internal Medicine

## 2010-10-31 ENCOUNTER — Ambulatory Visit (INDEPENDENT_AMBULATORY_CARE_PROVIDER_SITE_OTHER): Payer: 59 | Admitting: Internal Medicine

## 2010-10-31 VITALS — BP 112/84 | HR 90 | Temp 98.4°F | Ht 68.0 in | Wt 182.4 lb

## 2010-10-31 DIAGNOSIS — R7302 Impaired glucose tolerance (oral): Secondary | ICD-10-CM

## 2010-10-31 DIAGNOSIS — J069 Acute upper respiratory infection, unspecified: Secondary | ICD-10-CM

## 2010-10-31 DIAGNOSIS — J309 Allergic rhinitis, unspecified: Secondary | ICD-10-CM

## 2010-10-31 DIAGNOSIS — Z0000 Encounter for general adult medical examination without abnormal findings: Secondary | ICD-10-CM

## 2010-10-31 DIAGNOSIS — M549 Dorsalgia, unspecified: Secondary | ICD-10-CM

## 2010-10-31 DIAGNOSIS — R7309 Other abnormal glucose: Secondary | ICD-10-CM

## 2010-10-31 MED ORDER — TRAMADOL HCL ER 100 MG PO TB24: 100.0000 mg | ORAL_TABLET | Freq: Every day | ORAL | Status: AC

## 2010-10-31 MED ORDER — TRAMADOL HCL ER 100 MG PO TB24: 100.0000 mg | ORAL_TABLET | Freq: Every day | ORAL | Status: DC

## 2010-10-31 MED ORDER — TIZANIDINE HCL 4 MG PO TABS: 4.0000 mg | ORAL_TABLET | Freq: Four times a day (QID) | ORAL | Status: DC | PRN

## 2010-10-31 NOTE — Assessment & Plan Note (Addendum)
Exam c/w MSK strain, likely due to work, coincidently on the same side as her recent small renal stones; no further eval needed; will tx with ultram and tizanidine prn ,  to f/u any worsening symptoms or concerns

## 2010-10-31 NOTE — Assessment & Plan Note (Addendum)
Mild c/w viral illness, ok for mucinex otc prn ,  to f/u any worsening symptoms or concerns

## 2010-10-31 NOTE — Patient Instructions (Signed)
Take all new medications as prescribed. Continue all other medications as before Please return in 6 mo with Lab testing done 3-5 days before  

## 2010-10-31 NOTE — Progress Notes (Signed)
Subjective:    Patient ID: [name], female    DOB: Oct 17, 1980, 30 y.o.   MRN: [id number]  HPI  Here with pain to left flank area x 8 days, saw urology in [place], did KUB - clear per pt, but pain worse that evening with spasm like pain, went to ER that evening at [facility] who looked at the same KUB and felt likely had 2 stones - 1 mm and 3 mm, and possibly a third that had passed;  Seen in f/u with urology who stated they saw the same stones but felt not likely to be cause of the pain; pt not told if she had blood in urine that day (at urology and later in the ER);  Sent per urology for CT 2 days ago ([place] [facility]) and found stone, but still not convinced they were cause of the pain.  Pain still persists, some better over the past weekend, but last 3 -4 days overall worse; constant spasm like with sharp exacerbations/flare seconds to minutes;  But no bowel or bladder change except some ? Constipation last few days from pain medication (prn vicodin, and percocet limited rx), fever, wt loss,  worsening LE pain/numbness/weakness, gait change or falls.  Has some lower abd discomfort last 2 days but no incidental other urinary symptoms such as dysuria, freq, urgency. Hematuria.  No n/v, high fever, chills, swelling of any kind or rash.  Also saw [place] [facility] in feb 2012, had MRI - neg per pt - tx for sciatica diagnosis with PT - helped some.    To her the most recent pain symtpoms are most c/w her pain from past kidney stones.  Cont's to work at [employer] with freq bending and squatting even more lately as several co-workers are on maternity leave.  Does have several wks ongoing nasal allergy symptoms with clear congestion, itch and sneeze, without fever, pain, ST, cough or wheezing.   Pt denies polydipsia, polyuria  Pt states overall good compliance with meds, trying to follow lower cholesterol  diet, wt overall stable but little exercise however.     Past Medical History  Diagnosis Date   . GLUCOSE INTOLERANCE 03/19/2009  . COMMON MIGRAINE 03/19/2009  . URI 04/30/2009  . ALLERGIC RHINITIS 10/11/2009  . HYDRONEPHROSIS, RIGHT 05/11/2009  . RENAL CALCULUS, RIGHT 05/11/2009  . ANKLE PAIN, RIGHT 04/03/2009  . BACK PAIN 03/19/2009  . INSOMNIA-SLEEP DISORDER-UNSPEC 03/19/2009  . FLANK PAIN, RIGHT 05/11/2009  . NEPHROLITHIASIS, HX OF 03/19/2009  . Allergy to latex 04/11/2010  . Impaired glucose tolerance 10/31/2010   Past Surgical History  Procedure Date  . Abdominal hysterectomy   . Appendectomy 1994  . Oophorectomy 07/2008  . S/p left knee arthroscopic 1994  . Hx of etopic pregnancy 2005    reports that she has quit smoking. She does not have any smokeless tobacco history on file. She reports that she drinks alcohol. She reports that she does not use illicit drugs. family history includes Alcohol abuse in her mother; Bipolar disorder in her mother; Cancer in her other; Diabetes in her other; Heart disease in her other; Hypertension in her father and other; Seasonal affective disorder in her father; Stroke in her other; and Thyroid disease in her mother. Allergies  Allergen Reactions  . Aspirin   . Ibuprofen   . VHQ:IONGEXBMWUX+LKGMWNUUV+OZDGUYQIHK Acid+Aspartame   . Latex   . Nsaids   . Penicillins    Current Outpatient Prescriptions on File Prior to Visit  Medication Sig Dispense Refill  .  Calcium 1500 MG tablet Take 1,500 mg by mouth daily.        Marland Kitchen EPINEPHrine (EPIPEN) 0.3 mg/0.3 mL DEVI Inject 0.3 mg into the muscle. For severe allergic reaction       . fluticasone (FLONASE) 50 MCG/ACT nasal spray 1-2 sprays each nostril every night at bedtime during allergy season       . zolpidem (AMBIEN CR) 12.5 MG CR tablet Take 1 tablet (12.5 mg total) by mouth at bedtime as needed for sleep.  30 tablet  5     Review of Systems Review of Systems  Constitutional: Negative for diaphoresis and unexpected weight change.  HENT: Negative for drooling and tinnitus.   Eyes: Negative for  photophobia and visual disturbance.  Respiratory: Negative for choking and stridor.   Gastrointestinal: Negative for vomiting and blood in stool.  Genitourinary: Negative for hematuria and decreased urine volume.  Musculoskeletal: Negative for gait problem.  Skin: Negative for color change and wound.  Neurological: Negative for tremors and numbness.  Psychiatric/Behavioral: Negative for decreased concentration. The patient is not hyperactive.       Objective:   Physical Exam BP 112/84  Pulse 90  Temp(Src) 98.4 F (36.9 C) (Oral)  Ht 5\' 8"  (1.727 m)  Wt 182 lb 6 oz (82.725 kg)  BMI 27.73 kg/m2  SpO2 97% Physical Exam  VS noted Constitutional: Pt appears well-developed and well-nourished.  HENT: Head: Normocephalic.   bilta tm's midl erythema, sinus nontender Right Ear: External ear normal. , pharynx mild erythema without exudate Left Ear: External ear normal.  Eyes: Conjunctivae and EOM are normal. Pupils are equal, round, and reactive to light.  Neck: Normal range of motion. Neck supple.  Cardiovascular: Normal rate and regular rhythm.   Pulmonary/Chest: Effort normal and breath sounds normal.  Abd:  Soft, NT, non-distended, + BS Neurological: Pt is alert. No cranial nerve deficit.  Motor, dtr, sens intact throughout, gait normal Skin: Skin is warm. No erythema.  Psychiatric: Pt behavior is normal. Thought content normal.  Has left flank tender and spasm,  and left hip flexion brings out the pain

## 2010-11-03 ENCOUNTER — Encounter: Payer: Self-pay | Admitting: Internal Medicine

## 2010-11-03 NOTE — Assessment & Plan Note (Signed)
stable overall by hx and exam, and pt to continue medical treatment as before 

## 2010-11-03 NOTE — Assessment & Plan Note (Signed)
stable overall by hx and exam, most recent lab reviewed with pt, and pt to continue medical treatment as before  Lab Results  Component Value Date   WBC 17.6* 05/11/2009   HGB 13.7 05/11/2009   HCT 40.4 05/11/2009   PLT 375.0 05/11/2009   ALT 15 05/11/2009   AST 20 05/11/2009   NA 140 05/11/2009   K 4.1 05/11/2009   CL 100 05/11/2009   CREATININE 0.9 05/11/2009   BUN 6 05/11/2009   CO2 31 05/11/2009

## 2010-11-05 ENCOUNTER — Encounter: Payer: Self-pay | Admitting: Internal Medicine

## 2010-11-22 NOTE — Op Note (Signed)
NAMESCHAE, CANDO              ACCOUNT NO.:  [id number]   MEDICAL RECORD NO.:  [id number]          PATIENT TYPE:  AMB   LOCATION:  [facility]                         FACILITY:  [facility]   PHYSICIAN:  Daniel L. Gottsegen, M.D.DATE OF BIRTH:  06-Apr-1981   DATE OF PROCEDURE:  11/18/2005  DATE OF DISCHARGE:                                 OPERATIVE REPORT   PREOPERATIVE DIAGNOSIS:  Right lower quadrant pain with right ovarian cyst.   POSTOPERATIVE DIAGNOSIS:  Right lower quadrant pain with right ovarian cyst.   NAME OF OPERATION:  Diagnostic laparoscopy, with right ovarian cystectomy.   SURGEON:  Daniel L. Eda Paschal, M.D.   FIRST ASSISTANT:  Ivor Costa. Farrel Gobble, M.D.   ANESTHESIA:  General orotracheal.   INDICATIONS:  The patient is a 30 year old female who presented to the  office with severe right lower quadrant pain.  Ultrasound confirmed a right  ovarian cyst of approximately 6 cm. The patient was encouraged to try to  watch it with hopes that the pain would disappear as would this cyst, but  the pain continued to be unremitting, and the patient desired to have  surgery sooner rather than later because of the discomfort. She now enters  the hospital for diagnostic laparoscopy with right ovarian cystectomy. Her  history is suggestive that she might have endometriosis, and so we will  laser any endometriosis we find.   FINDINGS:  At the time of laparoscopy, the patient's right ovary was  enlarged about 6 cm by an ovarian cyst.  It drained blood-tinged fluid when  it was aspirated. Right tube was normal, with luxuriant fimbria. Left ovary  and fallopian tube were both normal. Left tube had luxuriant fimbria. The  uterus was normal.  There was sort of a strange finding as if the patient  had a previous cesarean section in that there was what appeared to be very  mild but clearly present vesicouterine fold of peritoneum adhesions.  They  were looked at carefully.  There did not appear to  be any evidence that  there was active endometriosis present.  The cul-de-sac was likewise free of  disease.  The rest of the exploration of the abdomen was normal.   PROCEDURE:  After adequate general endotracheal anesthesia, the patient was  placed in dorsal supine position, prepped and draped in the usual sterile  manner.  A  subumbilical incision was made.  An attempt was made to use the  Veress needle to create a pneumoperitoneum, but it was not successful in  terms of getting good flow, so this was abandoned.  The OptiVu was used with  the diagnostic scope, and under direct vision the peritoneal cavity was  entered without any trauma to any surrounding structures. Two pelvic ports,  5 mm each, were placed in the midline and the right lower quadrant. Through  these, surgical instruments were placed. First, peritoneal washings were  obtained and sent to pathology for tissue diagnosis.  Following this, the  right ovary was grasped. The fluid was aspirated. About 10 mL of blood-  tinged fluid was obtained, which collapsed the cyst significantly.  An  incision was then made over the capsule where the cyst was. The cyst wall  could be separated from the ovarian capsule and was removed in three or four  attempts, removing part of it each time with countertraction.  Following  this, a bipolar was used, both for hemostasis and also to destroy any  remaining cyst wall. At the termination of the procedure, there was no  bleeding noted.  Copious irrigation was done with sterile saline.  Pneumoperitoneum was evacuated.  The skin  incisions were removed.  The subumbilical incision was closed with 0 Vicryl  and 3-0 Monocryl, and the two lower incisions were closed with Steri-Strips.  The patient tolerated the procedure well and left the operating room in  satisfactory condition.      Daniel L. Eda Paschal, M.D.  Electronically Signed     DLG/MEDQ  D:  11/18/2005  T:  11/19/2005  Job:   [id number]

## 2010-11-22 NOTE — Op Note (Signed)
NAMEKORENE, DULA              ACCOUNT NO.:  [id number]   MEDICAL RECORD NO.:  [id number]          PATIENT TYPE:  AMB   LOCATION:  [facility]                           FACILITY:  [facility]   PHYSICIAN:  Daniel L. 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   OPERATION/PROCEDURE:  Loupe electrical excision procedure conization of  cervix.   SURGEON:  Daniel L. Eda Paschal, M.D.   ANESTHESIA:  General.   FINDINGS:  External and vaginal exam were normal.  Cervix is clean.  Uterus  is retroverted, normal size and shape.  Adnexa palpably normal.  At the time  of colposcopy, the patient had white epithelium seen in the same  distribution as was seen in the office which was mostly at 6 o'clock with  extension from 8 o'clock to 4 o'clock and slight at 12 o'clock.  Her  squamocolumnar junction was very exteriorized.  The entire transformation  zone could be visualized.  The area was external enough that it was not  possible to excise this in one pass.   OPERATION/PROCEDURE:  After adequate general anesthesia, the patient was  placed in the dorsal lithotomy position, prepped and draped in the usual  sterile manner with Hibiclens.  The patient was colposcoped and findings  were as noted above.  A 1:200,000 solution of epinephrine and 0.5% Xylocaine  was injected around the cervix, 8 mL were utilized.  Following this, using a  20 x 12 loupe, with settings of 35 cut, 50 coag, blend 1 of the __  valleymeade________ unit. A LEEP was done.  An additional pass needed to be  made at 6 o'clock.  These were correctly labeled and the large specimen was  cut at 12 o'clock.  The LEEP was  replaced with the ball electrode and the  entire area was cauterized.  There was no bleeding noted. Monsel's was  applied at the termination of the procedure. There was no bleeding noted.  The patient tolerated the procedure well and left the operating room in  satisfactory condition.      Daniel L. Eda Paschal, M.D.  Electronically Signed     DLG/MEDQ  D:  04/04/2005  T:  04/04/2005  Job:  [id number]

## 2010-11-22 NOTE — H&P (Signed)
[name], [name]              ACCOUNT NO.:  [id number]   MEDICAL RECORD NO.:  [id number]          PATIENT TYPE:  AMB   LOCATION:  [facility]C                           FACILITY:  [facility]   PHYSICIAN:  Daniel L. Gottsegen, M.D.DATE OF BIRTH:  12-20-1980   DATE OF ADMISSION:  DATE OF DISCHARGE:                                HISTORY & PHYSICAL   CHIEF COMPLAINT:  Severe dysplasia.   HISTORY OF PRESENT ILLNESS:  Patient is a 30 year old gravida 1, para 0, AB1  who first came to see me in July of 2006.  She underwent a yearly physical.  Pap smear showed low grade dysplasia so she underwent colposcopy with  biopsy.  Colposcopy with biopsy showed high-grade dysplasia.  There was also  concern because there was dysplasia that extended into the underlying  endocervical glands.  As a result of this it was felt that the patient  needed to be treated.  Patient had a very exteriorized squamocolumnar  junction and it was felt that this procedure should be done in the operating  room because of the risk of bleeding because a wider excision needed to be  done than normal.  She enters the hospital now for either cold knife  conization or LEEP depending on what appears to be the more appropriate  procedure to do when she is recolposcoped and is asleep in the operating  room.   PAST MEDICAL HISTORY:  1.  She had a previous ovarian pregnancy in 2005.  2.  She had an appendectomy in 1996.  3.  She had knee surgery in 1994.  4.  She currently takes Yasmin.  5.  It is felt she has polycystic ovarian disease.   She is allergic to IBUPROFEN, ASPIRIN, PENICILLIN, and AUGMENTIN.   FAMILY HISTORY:  Grandmother has coronary artery disease.  Grandfather has a  malignancy.  Grandmother, great grandmother are both diabetic.  Grandmother  is hypertensive.   SOCIAL HISTORY:  She smokes one pack every two days.  She uses caffeine.   REVIEW OF SYSTEMS:  HEENT:  Negative.  CARDIAC:  Negative.  RESPIRATORY:  Negative.  GI:  Negative.  GU:  Negative except for frequent urination and a  history of kidney stones and kidney infections.  MUSCULOSKELETAL:  Negative.  NEUROLOGIC:  Negative.  PSYCHIATRIC:  Negative.  LYMPHATIC:  Negative.  ENDOCRINE:  Negative.   PHYSICAL EXAMINATION:  GENERAL:  Patient is a well-developed, well-nourished  female in no acute distress.  VITAL SIGNS:  Blood pressure 124/74, pulse 80 and regular, respirations 16  and unlabored.  She is afebrile.  HEENT:  Within normal limits.  NECK:  Supple.  Trachea midline.  Thyroid is not enlarged.  LUNGS:  Clear to P&A.  HEART:  No thrills, heaves, or murmurs.  BREASTS:  No masses.  ABDOMEN:  Soft without guarding, rebound, or masses.  PELVIC:  External and vaginal are normal.  Cervix is clean.  Uterus is  anteverted, normal size and shape.  Adnexa are palpably normal.  Rectovaginal is negative.   ADMISSION IMPRESSION:  High-grade dysplasia with invasion of endocervical  glands.  PLAN:  Cold knife cone versus LEEP for above.      Daniel L. Eda Paschal, M.D.  Electronically Signed     DLG/MEDQ  D:  04/03/2005  T:  04/03/2005  Job:  [id number]

## 2010-12-20 ENCOUNTER — Emergency Department: Payer: Self-pay | Admitting: Emergency Medicine

## 2010-12-27 ENCOUNTER — Other Ambulatory Visit: Payer: Self-pay

## 2010-12-27 MED ORDER — TIZANIDINE HCL 4 MG PO TABS: 4.0000 mg | ORAL_TABLET | Freq: Four times a day (QID) | ORAL | Status: DC | PRN

## 2011-02-17 ENCOUNTER — Emergency Department: Payer: Self-pay | Admitting: Emergency Medicine

## 2011-03-14 ENCOUNTER — Other Ambulatory Visit: Payer: Self-pay

## 2011-03-14 MED ORDER — TIZANIDINE HCL 4 MG PO TABS: 4.0000 mg | ORAL_TABLET | Freq: Four times a day (QID) | ORAL | Status: DC | PRN

## 2011-03-14 NOTE — Telephone Encounter (Signed)
Pt is requesting refill until appt 10/05

## 2011-03-25 ENCOUNTER — Other Ambulatory Visit: Payer: Self-pay

## 2011-03-25 MED ORDER — ZOLPIDEM TARTRATE ER 12.5 MG PO TBCR: 12.5000 mg | EXTENDED_RELEASE_TABLET | Freq: Every evening | ORAL | Status: DC | PRN

## 2011-03-26 NOTE — Telephone Encounter (Signed)
Faxed hardcopy to [facility]

## 2011-04-02 ENCOUNTER — Other Ambulatory Visit: Payer: Self-pay

## 2011-04-02 MED ORDER — TRAMADOL HCL 50 MG PO TABS: 50.0000 mg | ORAL_TABLET | Freq: Four times a day (QID) | ORAL | Status: DC | PRN

## 2011-04-11 ENCOUNTER — Ambulatory Visit (INDEPENDENT_AMBULATORY_CARE_PROVIDER_SITE_OTHER): Payer: BC Managed Care – PPO | Admitting: Family Medicine

## 2011-04-11 ENCOUNTER — Encounter: Payer: Self-pay | Admitting: Family Medicine

## 2011-04-11 VITALS — BP 112/74 | HR 78 | Temp 98.2°F | Ht 68.0 in | Wt 182.0 lb

## 2011-04-11 DIAGNOSIS — G4700 Insomnia, unspecified: Secondary | ICD-10-CM

## 2011-04-11 DIAGNOSIS — M549 Dorsalgia, unspecified: Secondary | ICD-10-CM

## 2011-04-11 DIAGNOSIS — R7302 Impaired glucose tolerance (oral): Secondary | ICD-10-CM

## 2011-04-11 DIAGNOSIS — Z87442 Personal history of urinary calculi: Secondary | ICD-10-CM

## 2011-04-11 DIAGNOSIS — R7309 Other abnormal glucose: Secondary | ICD-10-CM

## 2011-04-11 DIAGNOSIS — R739 Hyperglycemia, unspecified: Secondary | ICD-10-CM

## 2011-04-11 DIAGNOSIS — L989 Disorder of the skin and subcutaneous tissue, unspecified: Secondary | ICD-10-CM

## 2011-04-11 DIAGNOSIS — Z23 Encounter for immunization: Secondary | ICD-10-CM

## 2011-04-11 LAB — BASIC METABOLIC PANEL
Calcium: 9.5 mg/dL (ref 8.4–10.5)
GFR: 85.1 mL/min (ref >60.00)
Potassium: 4.3 mEq/L (ref 3.5–5.1)
Sodium: 140 mEq/L (ref 135–145)

## 2011-04-11 MED ORDER — ZOLPIDEM TARTRATE ER 12.5 MG PO TBCR: 12.5000 mg | EXTENDED_RELEASE_TABLET | Freq: Every evening | ORAL | Status: DC | PRN

## 2011-04-11 MED ORDER — TRAMADOL HCL 50 MG PO TABS: 50.0000 mg | ORAL_TABLET | Freq: Four times a day (QID) | ORAL | Status: DC | PRN

## 2011-04-11 MED ORDER — TIZANIDINE HCL 4 MG PO TABS: 4.0000 mg | ORAL_TABLET | Freq: Four times a day (QID) | ORAL | Status: DC | PRN

## 2011-04-11 NOTE — Patient Instructions (Addendum)
Don't change your meds.  Take care.  Let me know if you have other concerns.  I would continue stretching and good luck with the running.  You can get your results through our phone system.  Follow the instructions on the blue card.  Glad to see you today.  Let me know if the spot on your face is changing.

## 2011-04-13 ENCOUNTER — Encounter: Payer: Self-pay | Admitting: Family Medicine

## 2011-04-13 DIAGNOSIS — L989 Disorder of the skin and subcutaneous tissue, unspecified: Secondary | ICD-10-CM | POA: Insufficient documentation

## 2011-04-13 NOTE — Assessment & Plan Note (Signed)
Continue current meds and stretching.  She agrees.

## 2011-04-13 NOTE — Assessment & Plan Note (Signed)
Per uro.  

## 2011-04-13 NOTE — Assessment & Plan Note (Signed)
Cont current meds.

## 2011-04-13 NOTE — Assessment & Plan Note (Signed)
Continue work on diet and exercise, see notes on labs.

## 2011-04-13 NOTE — Progress Notes (Signed)
To est care.   R SI and lower back pain, has seen ortho.  Burning in the area, but not down the posterior leg as with sciatica. No surgery planned, likely just medical mgmt.  No ADEs with current meds. Is stretching.   H/o insomnia, after hysterectomy.  Ambien works better than any other med she has tried.  No ADE.  Good sleep hygiene.    H/o lithotripsy for renal stones.  No pain from this currently.  Has had uro eval.  Mult stones in past.   H/o hyperglycemia.  Mild on prev labs.  D/w pt about diet and exercise.  See labs from today.   H/o lesion on R side of face.  No personal h/o skin CA.  Present for months, slightly darker.  No other new lesions.   PMH and SH reviewed  ROS: See HPI, otherwise noncontributory.  Meds, vitals, and allergies reviewed.   GEN: nad, alert and oriented HEENT: mucous membranes moist NECK: supple w/o LA CV: rrr.  no murmur PULM: ctab, no inc wob ABD: soft, +bs EXT: no edema SKIN: no acute rash but 10x7 mm brown macule on R side of face.  Benign appearing without variation in pigment. No ulceration.   ttp near R SI with + R SI pain on testing

## 2011-04-13 NOTE — Assessment & Plan Note (Signed)
Likely benign and she'll notify me of changes from now on.  Derm referral offered, declined.  Refer if the lesion changes significantly.  She agrees.

## 2011-04-17 ENCOUNTER — Emergency Department: Payer: Self-pay | Admitting: Emergency Medicine

## 2011-04-17 ENCOUNTER — Emergency Department: Payer: Self-pay | Admitting: *Deleted

## 2011-04-17 ENCOUNTER — Telehealth: Payer: Self-pay | Admitting: Family Medicine

## 2011-04-17 NOTE — Telephone Encounter (Signed)
Pt's husband called says pt went to the ER at [facility] on today. She was diag w/ Kidney Stones.  Labs were done. Pt wants second opinion from her PCP.  I scheduled an appt w/ Dr. Sharen Hones for Tomorrow. Pt call back #

## 2011-04-17 NOTE — Telephone Encounter (Signed)
I don't have records to review.  If they think they need a second opinion, then I would f/u with G tomorrow.

## 2011-04-21 ENCOUNTER — Ambulatory Visit: Payer: BC Managed Care – PPO | Admitting: Family Medicine

## 2011-04-21 ENCOUNTER — Ambulatory Visit: Payer: Self-pay | Admitting: Urology

## 2011-04-21 LAB — POCT URINALYSIS DIP (DEVICE)
Nitrite: NEGATIVE
Operator id: 235561
Protein, ur: NEGATIVE
Urobilinogen, UA: 0.2

## 2011-04-21 LAB — POCT PREGNANCY, URINE
Operator id: 235561
Preg Test, Ur: NEGATIVE

## 2011-04-21 NOTE — Telephone Encounter (Signed)
Pt cancelled appt w/ Dr. Sharen Hones....cdavis

## 2011-04-24 ENCOUNTER — Ambulatory Visit: Payer: Self-pay | Admitting: Urology

## 2011-05-02 ENCOUNTER — Ambulatory Visit: Payer: 59 | Admitting: Internal Medicine

## 2011-05-02 DIAGNOSIS — Z0289 Encounter for other administrative examinations: Secondary | ICD-10-CM

## 2011-05-23 ENCOUNTER — Ambulatory Visit: Payer: Self-pay | Admitting: Urology

## 2011-06-18 ENCOUNTER — Telehealth: Payer: Self-pay | Admitting: Internal Medicine

## 2011-06-18 NOTE — Telephone Encounter (Signed)
Patient advised.

## 2011-06-18 NOTE — Telephone Encounter (Signed)
Patient called and stated everything she eats goes through her.  She is seeing blood in her stool.  I made her an appointment for tomorrow.  She wanted to know what to do to help ease off the diaherra until she gets here in the morning.

## 2011-06-18 NOTE — Telephone Encounter (Signed)
She can try otc imodium 1-2 tabs per day, but I wouldn't do anything else to stop the diarrhea.  If she is passing so much blood that she is lightheaded or feeling presyncopal, then she needs be at the ER.  Drink plenty of fluids in meantime.

## 2011-06-19 ENCOUNTER — Encounter: Payer: Self-pay | Admitting: Family Medicine

## 2011-06-19 ENCOUNTER — Ambulatory Visit (INDEPENDENT_AMBULATORY_CARE_PROVIDER_SITE_OTHER): Payer: BC Managed Care – PPO | Admitting: Family Medicine

## 2011-06-19 DIAGNOSIS — R197 Diarrhea, unspecified: Secondary | ICD-10-CM

## 2011-06-19 MED ORDER — HYDROCODONE-HOMATROPINE 5-1.5 MG/5ML PO SYRP: 5.0000 mL | ORAL_SOLUTION | Freq: Four times a day (QID) | ORAL | Status: DC | PRN

## 2011-06-19 NOTE — Progress Notes (Signed)
Cold sx for about 1 week.  Day before yesterday she had some stomach cramps and then diarrhea.  Since then, profuse diarrhea.  Had some bright red blood in stool, no clots, then progressive inc in the amount of blood in stool, then stopped since yesterday AM.  No blood or BM since yesterday AM.  She had fecal urgency and cramps that would improve with BM.  Initially with some vomiting from cramps, but vomiting is better now.  Nausea is better now.  Up to 20 BMs in a day prev.  Feels weak but not presyncopal.  No fevers.  Still with uri sx- cough, congestions, ST and rhinorrhea.  Voice change noted by patient.  Mult sick contacts at work.    Meds, vitals, and allergies reviewed.   ROS: See HPI.  Otherwise, noncontributory.  GEN: nad, alert and oriented HEENT: mucous membranes moist, tm w/o erythema, nasal exam w/o erythema, clear discharge noted,  OP with cobblestoning NECK: supple w/o LA CV: rrr.   PULM: ctab, no inc wob ABD soft, not ttp, no masses, normal BS EXT: no edema SKIN: no acute rash

## 2011-06-19 NOTE — Patient Instructions (Addendum)
Drink plenty of fluids but avoid dairy, take tylenol as needed, and gargle with warm salt water for your throat.  Get some rest.  This should gradually improve.  Take care.  Let us know if you have other concerns.  Wash your hands frequently.  Take the hycodan for cough.

## 2011-06-20 ENCOUNTER — Encounter: Payer: Self-pay | Admitting: Family Medicine

## 2011-06-20 DIAGNOSIS — R197 Diarrhea, unspecified: Secondary | ICD-10-CM | POA: Insufficient documentation

## 2011-06-20 NOTE — Assessment & Plan Note (Signed)
AGE, likely viral, recently in community and this is the likely cause.  Blood in stool resolved, no more diarrhea, nontoxic.  ddx d/w pt.  Supportive care, fu prn.  Work note given, potentially contagious.  Call back as needed, if sx continue, return.

## 2011-06-23 ENCOUNTER — Other Ambulatory Visit: Payer: Self-pay

## 2011-06-23 MED ORDER — ZOLPIDEM TARTRATE ER 12.5 MG PO TBCR: 12.5000 mg | EXTENDED_RELEASE_TABLET | Freq: Every evening | ORAL | Status: DC | PRN

## 2011-06-23 NOTE — Telephone Encounter (Signed)
Faxed hardcopy to [facility]  

## 2011-06-23 NOTE — Telephone Encounter (Signed)
Done hardcopy to robin  

## 2011-06-24 ENCOUNTER — Telehealth: Payer: Self-pay | Admitting: Internal Medicine

## 2011-06-24 MED ORDER — BENZONATATE 200 MG PO CAPS: 200.0000 mg | ORAL_CAPSULE | Freq: Three times a day (TID) | ORAL | Status: AC | PRN

## 2011-06-24 MED ORDER — AZITHROMYCIN 250 MG PO TABS: ORAL_TABLET | ORAL | Status: AC

## 2011-06-24 NOTE — Telephone Encounter (Signed)
Patient called and stated the cough syrup Hycodan that was prescribed to her, she is allergic to she broke out in hives and began  itching.  She said she has tried 3 different OTC cough medication and they just don't work and wanted to know if you could call in something else for her.

## 2011-06-24 NOTE — Telephone Encounter (Signed)
She had hives on the hycodan, better with benadryl and she hasn't taken the hycodan in the meantime.  This is a new allergy.  The cough continues at night.  No help with mucinex, rotibussin and delsym.  No fever, still with yellow/green sputum.  Cough has gotten worse over the last few nights.  Will try tessalon and given the duration start zmax.  Allergy and med list updated.

## 2011-08-08 ENCOUNTER — Other Ambulatory Visit: Payer: Self-pay

## 2011-08-08 MED ORDER — TRAMADOL HCL 50 MG PO TABS: 50.0000 mg | ORAL_TABLET | Freq: Four times a day (QID) | ORAL | Status: DC | PRN

## 2011-10-10 ENCOUNTER — Other Ambulatory Visit: Payer: Self-pay | Admitting: Family Medicine

## 2011-10-10 NOTE — Telephone Encounter (Signed)
Electronic refill request

## 2011-10-12 NOTE — Telephone Encounter (Signed)
Sent!

## 2011-11-17 ENCOUNTER — Ambulatory Visit (INDEPENDENT_AMBULATORY_CARE_PROVIDER_SITE_OTHER): Payer: BC Managed Care – PPO | Admitting: Family Medicine

## 2011-11-17 ENCOUNTER — Encounter: Payer: Self-pay | Admitting: Family Medicine

## 2011-11-17 VITALS — BP 112/74 | HR 64 | Temp 97.2°F | Wt 193.8 lb

## 2011-11-17 DIAGNOSIS — M79609 Pain in unspecified limb: Secondary | ICD-10-CM

## 2011-11-17 DIAGNOSIS — S86119A Strain of other muscle(s) and tendon(s) of posterior muscle group at lower leg level, unspecified leg, initial encounter: Secondary | ICD-10-CM | POA: Insufficient documentation

## 2011-11-17 DIAGNOSIS — M79662 Pain in left lower leg: Secondary | ICD-10-CM

## 2011-11-17 MED ORDER — EPINEPHRINE 0.3 MG/0.3ML IJ DEVI: 0.3000 mg | Freq: Once | INTRAMUSCULAR | Status: DC

## 2011-11-17 NOTE — Patient Instructions (Addendum)
You have a calf strain.   Treat with compression, ice/heat, elevation.  Use tylenol 500mg  1-2 pills 3 times daily. Rest calf for next 3-4 weeks. Return to see me in 3-4 weeks to recheck, sooner if any worsening.

## 2011-11-17 NOTE — Progress Notes (Signed)
  Subjective:    Patient ID: [name], female    DOB: [birth year], 31 y.o.   MRN: [id number]  HPI CC: calf pain  Pulled left calf muscle kickboxing 1 mo ago.  Hurt a bit for a few days then improved.  Then a few weeks ago hurt running playing softball, felt pop but then resolved after a few days.  This week playing softball, rounding second base running felt pop, also had bruise.  Now not resolving, pain continued.  So far has tried ice/heat, also compression with bandage which helped some.  No h/o calf or leg injuries in past.  Review of Systems Per HPI    Objective:   Physical Exam  Nursing note and vitals reviewed. Constitutional: She appears well-developed and well-nourished. No distress.  HENT:  Head: Atraumatic.  Musculoskeletal: She exhibits no edema.       No swelling bilaterally Tender to palpation medial left calf, faint ecchymosis present as well. Pain with plantar flexion and inversion of ankle against resistance. No pain at achilles tendon, neg squeeze test, normal thompson test.  Skin: Skin is warm and dry. No rash noted.  Psychiatric: She has a normal mood and affect.       Assessment & Plan:

## 2011-11-17 NOTE — Assessment & Plan Note (Signed)
Anticipate left medial gastroc muscle strain, moderate. Recommended rest, ice/heat, elevation of calf, and compression - wrapped with latex-free bandage. Pt cannot tolerate NSAIDs. rec out of exercise/ activity for next 3-4 wks, may need to prolong this. Provided with stretching exercises from SM pt advisor on calf strain. F/u with me in 3-4 wks or as needed

## 2011-11-20 ENCOUNTER — Ambulatory Visit (INDEPENDENT_AMBULATORY_CARE_PROVIDER_SITE_OTHER): Payer: BC Managed Care – PPO | Admitting: Family Medicine

## 2011-11-20 ENCOUNTER — Encounter: Payer: Self-pay | Admitting: Family Medicine

## 2011-11-20 ENCOUNTER — Encounter: Payer: Self-pay | Admitting: *Deleted

## 2011-11-20 VITALS — BP 104/76 | HR 91 | Temp 98.3°F | Wt 189.0 lb

## 2011-11-20 DIAGNOSIS — S838X9A Sprain of other specified parts of unspecified knee, initial encounter: Secondary | ICD-10-CM

## 2011-11-20 DIAGNOSIS — S86119A Strain of other muscle(s) and tendon(s) of posterior muscle group at lower leg level, unspecified leg, initial encounter: Secondary | ICD-10-CM

## 2011-11-20 NOTE — Patient Instructions (Signed)
Wear the boot whenever you are out of bed and schedule a follow up with Dr. Patsy Lager in 3 weeks.  Take care.  No driving wearing the boot.

## 2011-11-20 NOTE — Progress Notes (Signed)
Prev HPI reviewed.  Pulled left calf muscle kickboxing 1 mo ago. Hurt a bit for a few days then improved. Then a few weeks ago hurt running playing softball, felt pop but then resolved after a few days. This week playing softball, rounding second base running felt pop, also had bruise. Now not resolving, pain continued.  So far has tried ice/heat, also compression with bandage which helped some.  More puffy now than Monday.  More painful, pain now along the achilles.  No more audible pops.  Most comfortable in flip flops.  Trouble with tennis shoes due to edema.  Can bear weight.  Sig pain walking, less sitting down.  No foot pain.   Meds, vitals, and allergies reviewed.   ROS: See HPI.  Otherwise, noncontributory.  nad ncat L ankle and calf puffy ttp at junction of gastroc and achilles and also medially with small bruise noted Pain with plantar flexion of L foot and esp with great toe Ankle stable, med/lat mal not ttp Distally nv intact No motor deficit in foot.

## 2011-11-20 NOTE — Assessment & Plan Note (Addendum)
With likely flexor hallucis longus strain.  Better in CAM walker.   Will use for 3 weeks and then fu with Dr. Patsy Lager.  D/w pt.

## 2011-11-21 ENCOUNTER — Telehealth: Payer: Self-pay

## 2011-11-21 MED ORDER — HYDROCODONE-ACETAMINOPHEN 5-500 MG PO TABS: 1.0000 | ORAL_TABLET | Freq: Four times a day (QID) | ORAL | Status: AC | PRN

## 2011-11-21 NOTE — Telephone Encounter (Signed)
Pt called back and pain level now is 6. [facility] is pharmacy.Please advise.

## 2011-11-21 NOTE — Telephone Encounter (Signed)
Medication phoned to pharmacy.  

## 2011-11-21 NOTE — Telephone Encounter (Signed)
Pt seen 11/20/11; pt left v/m still pain even when wearing boot due to torn calf muscle. Pt request pain medication but did not leave a pharmacy. Tried contact # [phone] and left v/m for pt to call back.

## 2011-11-21 NOTE — Telephone Encounter (Signed)
I talked with her about her allergy history.  She has reaction/hives with mult cough meds that had red dye, some with and some without hydrocodone.  Had tolerated vicodin prev.  It appears reasonable to use vicodin in this situation.  She has benadryl and epipen should she have a reaction, though I don't think either will be needed.  She'll call back if pain is not well controlled.  She does feel more supported in the CAM but current meds hadn't relieved the pain. Sedation caution given on meds.   Please call in rx for vicodin.

## 2011-12-05 ENCOUNTER — Other Ambulatory Visit: Payer: Self-pay | Admitting: Family Medicine

## 2011-12-05 ENCOUNTER — Other Ambulatory Visit: Payer: Self-pay

## 2011-12-05 NOTE — Telephone Encounter (Signed)
Sent!

## 2011-12-05 NOTE — Telephone Encounter (Signed)
Electronic refill request

## 2011-12-05 NOTE — Telephone Encounter (Signed)
A user error has taken place: encounter opened in error, closed for administrative reasons.

## 2011-12-09 ENCOUNTER — Other Ambulatory Visit: Payer: Self-pay | Admitting: *Deleted

## 2011-12-09 NOTE — Telephone Encounter (Signed)
Received fax pt needing PA on her zolpidem tartrate. Faxing requesting back to pharmacy no longer see Dr. Jonny Ruiz pt see Dr. Crawford Givens.... 12/09/11@11 :12am/LMB

## 2011-12-15 ENCOUNTER — Ambulatory Visit: Payer: BC Managed Care – PPO | Admitting: Family Medicine

## 2011-12-26 ENCOUNTER — Other Ambulatory Visit: Payer: Self-pay | Admitting: *Deleted

## 2011-12-26 MED ORDER — ZOLPIDEM TARTRATE ER 12.5 MG PO TBCR: 12.5000 mg | EXTENDED_RELEASE_TABLET | Freq: Every evening | ORAL | Status: DC | PRN

## 2011-12-26 NOTE — Telephone Encounter (Signed)
Faxed refill request   

## 2011-12-26 NOTE — Telephone Encounter (Signed)
Please call in.  Thanks.   

## 2011-12-29 ENCOUNTER — Other Ambulatory Visit: Payer: Self-pay | Admitting: *Deleted

## 2011-12-29 ENCOUNTER — Other Ambulatory Visit: Payer: Self-pay

## 2011-12-29 MED ORDER — ZOLPIDEM TARTRATE ER 12.5 MG PO TBCR: 12.5000 mg | EXTENDED_RELEASE_TABLET | Freq: Every evening | ORAL | Status: DC | PRN

## 2011-12-29 MED ORDER — TRAMADOL HCL 50 MG PO TABS: 50.0000 mg | ORAL_TABLET | Freq: Four times a day (QID) | ORAL | Status: DC | PRN

## 2011-12-29 MED ORDER — TIZANIDINE HCL 4 MG PO TABS: 4.0000 mg | ORAL_TABLET | Freq: Four times a day (QID) | ORAL | Status: DC | PRN

## 2011-12-29 NOTE — Telephone Encounter (Signed)
Patient requests 90 day supplies to [facility].

## 2011-12-29 NOTE — Telephone Encounter (Signed)
Pt ck on Ambien refill. Was called in this AM to [facility]. Pt will ck with pharmacy.

## 2011-12-29 NOTE — Telephone Encounter (Signed)
[facility], please fax in.  Others sent.  Thanks.

## 2011-12-29 NOTE — Telephone Encounter (Signed)
Medication phoned to pharmacy.  

## 2011-12-30 ENCOUNTER — Telehealth: Payer: Self-pay

## 2011-12-30 NOTE — Telephone Encounter (Signed)
PA form for Ambien/zolpidem on Dr Lianne Bushy desk in the in box.

## 2011-12-30 NOTE — Telephone Encounter (Signed)
Pt said needs PA started for Ambien PA#[phone]. Spoke with [employer]; will fax PA form today.

## 2011-12-30 NOTE — Telephone Encounter (Signed)
[facility].

## 2011-12-31 NOTE — Telephone Encounter (Signed)
Done, please scan and send.

## 2011-12-31 NOTE — Telephone Encounter (Signed)
Form faxedback to [facility].

## 2012-01-07 NOTE — Telephone Encounter (Signed)
Prior auth given for Ambien; letter placed in Dr Lianne Bushy in box for signature and scanning.

## 2012-02-05 ENCOUNTER — Ambulatory Visit (INDEPENDENT_AMBULATORY_CARE_PROVIDER_SITE_OTHER): Payer: 59 | Admitting: Family Medicine

## 2012-02-05 ENCOUNTER — Telehealth: Payer: Self-pay | Admitting: Family Medicine

## 2012-02-05 ENCOUNTER — Ambulatory Visit (INDEPENDENT_AMBULATORY_CARE_PROVIDER_SITE_OTHER)
Admission: RE | Admit: 2012-02-05 | Discharge: 2012-02-05 | Disposition: A | Discharge status: Home / Self Care | Payer: 59 | Source: Ambulatory Visit | Attending: Family Medicine | Admitting: Family Medicine

## 2012-02-05 ENCOUNTER — Encounter: Payer: Self-pay | Admitting: Family Medicine

## 2012-02-05 VITALS — BP 118/92 | HR 92 | Temp 97.9°F | Wt 193.0 lb

## 2012-02-05 DIAGNOSIS — N200 Calculus of kidney: Secondary | ICD-10-CM

## 2012-02-05 DIAGNOSIS — Z87442 Personal history of urinary calculi: Secondary | ICD-10-CM

## 2012-02-05 LAB — POCT URINALYSIS DIPSTICK
Protein, UA: NEGATIVE
Spec Grav, UA: 1.02
Urobilinogen, UA: NEGATIVE
pH, UA: 6

## 2012-02-05 MED ORDER — TAMSULOSIN HCL 0.4 MG PO CAPS: 0.4000 mg | ORAL_CAPSULE | Freq: Every day | ORAL | Status: DC

## 2012-02-05 MED ORDER — NITROFURANTOIN MONOHYD MACRO 100 MG PO CAPS: 100.0000 mg | ORAL_CAPSULE | Freq: Two times a day (BID) | ORAL | Status: AC

## 2012-02-05 MED ORDER — OXYCODONE-ACETAMINOPHEN 5-325 MG PO TABS: 1.0000 | ORAL_TABLET | Freq: Four times a day (QID) | ORAL | Status: AC | PRN

## 2012-02-05 NOTE — Telephone Encounter (Signed)
Pt not having frequency or burning when urinates; pt voiding less than usual, no N or V. Pain level now is 5. Pt thinks beginning of kidney stone. Pt to see Dr Para March today at 2:45. If pts condition changes or worsens pt will call back.

## 2012-02-05 NOTE — Telephone Encounter (Signed)
Caller: [name]/Patient; PCP: Crawford Givens Clelia Croft); CB#: [phone]; Pt calling today 02/05/12 regarding pain in right lower back, feels like bladder is not fully emptying and having pressure in bladder area.  Onset 02/03/12 and has progressed.  Pt has hx of kidney stones. Afebrile. Emergent symptoms r/o by Flank Pain guidelines with exception of flank pain or low back pain and urinary tract symptoms.  OFFICE, NO APPTS AVAILABLE IN EPIC, PT NEEDS TO BE SEEN WITHIN 4 HOURS.  PLEASE CALL PT BACK AT [phone] FOR WORK IN APPT.

## 2012-02-05 NOTE — Telephone Encounter (Signed)
Noted  

## 2012-02-05 NOTE — Patient Instructions (Addendum)
Take the flomax and macrobid along with the percocet.  If you don't pass the stone, notify us so we can set you up with urology.  Take care.

## 2012-02-05 NOTE — Progress Notes (Signed)
H/o renal stones, 5 or 6 passed.  Lithotripsy in past.  Pain started 2-3 days ago, felt a twinge on R flank.  She increased fluid intake.  Yesterday PM pain was worse.  No vomiting.  This feels like prev stone.  No blood seen in urine.  No fevers.    She had some leftover flomax and macrobid. She started both last night.    PMH noted.   Meds, vitals, and allergies reviewed.   ROS: See HPI.  Otherwise, noncontributory.  nad but uncomfortable Mmm rrr ctab abd soft, not ttp Back with R>L flank pain but no isolated cva pain Ext w/o edema  Xray reviewed.   U/a reviewed.

## 2012-02-06 NOTE — Assessment & Plan Note (Signed)
With sx, hx c/w another stone.  She has h/o pain on side opposite the stone with prev episodes.  Will restart macrobid given her history, continue flomax, percocet for pain and continue fluids.  She agrees.  If not passed, will need uro f/u.  Ok for outpatient f/u.  >25 min spent with face to face with patient, >50% counseling and/or coordinating care.

## 2012-02-09 ENCOUNTER — Emergency Department: Payer: Self-pay | Admitting: Emergency Medicine

## 2012-02-09 LAB — COMPREHENSIVE METABOLIC PANEL
Albumin: 3.9 g/dL (ref 3.4–5.0)
Alkaline Phosphatase: 114 U/L (ref 50–136)
Anion Gap: 4 — ABNORMAL LOW (ref 7–16)
Bilirubin,Total: 0.2 mg/dL (ref 0.2–1.0)
Calcium, Total: 9.3 mg/dL (ref 8.5–10.1)
Creatinine: 0.79 mg/dL (ref 0.60–1.30)
EGFR (African American): >60
Glucose: 97 mg/dL (ref 65–99)
Potassium: 4.6 mmol/L (ref 3.5–5.1)
SGOT(AST): 28 U/L (ref 15–37)
SGPT (ALT): 32 U/L (ref 12–78)
Sodium: 141 mmol/L (ref 136–145)

## 2012-02-09 LAB — CBC
HCT: 42.5 % (ref 35.0–47.0)
HGB: 14.5 g/dL (ref 12.0–16.0)
MCH: 29.3 pg (ref 26.0–34.0)
MCHC: 34.2 g/dL (ref 32.0–36.0)
RBC: 4.97 10*6/uL (ref 3.80–5.20)
WBC: 8.8 10*3/uL (ref 3.6–11.0)

## 2012-02-09 LAB — URINALYSIS, COMPLETE
Bacteria: NONE SEEN
Bilirubin,UR: NEGATIVE
Blood: NEGATIVE
Glucose,UR: NEGATIVE mg/dL (ref 0–75)
Ketone: NEGATIVE
Protein: NEGATIVE
Specific Gravity: 1.016 (ref 1.003–1.030)
Squamous Epithelial: 2
WBC UR: 2 /HPF (ref 0–5)

## 2012-03-21 ENCOUNTER — Emergency Department: Payer: Self-pay | Admitting: Emergency Medicine

## 2012-03-21 LAB — CBC
HGB: 14.6 g/dL (ref 12.0–16.0)
MCV: 84 fL (ref 80–100)
Platelet: 390 10*3/uL (ref 150–440)
RBC: 5.13 10*6/uL (ref 3.80–5.20)
WBC: 9.2 10*3/uL (ref 3.6–11.0)

## 2012-03-21 LAB — URINALYSIS, COMPLETE
Nitrite: NEGATIVE
Protein: 100
RBC,UR: 10318 /HPF (ref 0–5)
Specific Gravity: 1.02 (ref 1.003–1.030)
WBC UR: 11 /HPF (ref 0–5)

## 2012-03-21 LAB — BASIC METABOLIC PANEL
Anion Gap: 8 (ref 7–16)
BUN: 12 mg/dL (ref 7–18)
Chloride: 104 mmol/L (ref 98–107)
Co2: 29 mmol/L (ref 21–32)
Creatinine: 0.92 mg/dL (ref 0.60–1.30)
Potassium: 3.2 mmol/L — ABNORMAL LOW (ref 3.5–5.1)
Sodium: 141 mmol/L (ref 136–145)

## 2012-03-23 ENCOUNTER — Encounter: Payer: Self-pay | Admitting: Family Medicine

## 2012-03-23 ENCOUNTER — Ambulatory Visit (INDEPENDENT_AMBULATORY_CARE_PROVIDER_SITE_OTHER): Payer: 59 | Admitting: Family Medicine

## 2012-03-23 VITALS — BP 126/92 | HR 64 | Temp 98.3°F | Wt 193.0 lb

## 2012-03-23 DIAGNOSIS — N200 Calculus of kidney: Secondary | ICD-10-CM

## 2012-03-23 DIAGNOSIS — Z87442 Personal history of urinary calculi: Secondary | ICD-10-CM

## 2012-03-23 LAB — BASIC METABOLIC PANEL
BUN: 15 mg/dL (ref 6–23)
Chloride: 107 mEq/L (ref 96–112)
Creatinine, Ser: 1 mg/dL (ref 0.4–1.2)
GFR: 69.83 mL/min (ref >60.00)
Glucose, Bld: 89 mg/dL (ref 70–99)
Potassium: 3.7 mEq/L (ref 3.5–5.1)

## 2012-03-23 LAB — POCT URINALYSIS DIPSTICK
Glucose, UA: NEGATIVE
Spec Grav, UA: 1.015
Urobilinogen, UA: NEGATIVE
pH, UA: 7.5

## 2012-03-23 MED ORDER — OXYCODONE HCL 5 MG PO TABS: 5.0000 mg | ORAL_TABLET | Freq: Four times a day (QID) | ORAL | Status: DC | PRN

## 2012-03-23 NOTE — Patient Instructions (Signed)
Stop the calcium for now.  Go to the lab on the way out.  We'll contact you with your lab report. See Shirlee Limerick about your referral before you leave today. Use the oxycodone as needed and stay on the flomax.

## 2012-03-23 NOTE — Assessment & Plan Note (Signed)
H/o mult stones.  Refer to [facility] for 2nd opinion and start metabolic eval.  See notes on labs.   Use oxycodone in meantime and continue flomax.  She agrees.  Okay for outpatient f/u.

## 2012-03-23 NOTE — Progress Notes (Signed)
She passed a stone after the last episode in 8/13.   She was prev stented for stones, prev with lithotripsy.  She prev had calcium stones on stone analysis.  Bloody urine Sunday with L sided flank pain.  Was seen at [facility] ER Sunday.  Pain is still present, but bearable with oxycodone, but urine isn't as visibly bloody. No FCV.  Some nausea from the pain.  She hasn't passed a stone with this episode.  She would like second opinion from urology.    She has rx for oxycodone and flomax.  She has ~10 oxycodone left.  Was given toradol at ER and was able to tolerate it.  No h/o misuse of meds.  ER recs requested.   Meds, vitals, and allergies reviewed.   ROS: See HPI.  Otherwise, noncontributory.  nad but uncomfortable rrr ctab abd soft, not ttp L flank ttp Ext well perfused.

## 2012-03-25 ENCOUNTER — Ambulatory Visit (HOSPITAL_COMMUNITY)
Admission: RE | Admit: 2012-03-25 | Discharge: 2012-03-25 | Disposition: A | Discharge status: Home / Self Care | Payer: 59 | Source: Ambulatory Visit | Attending: Urology | Admitting: Urology

## 2012-03-25 ENCOUNTER — Ambulatory Visit (HOSPITAL_COMMUNITY): Payer: 59

## 2012-03-25 ENCOUNTER — Encounter (HOSPITAL_COMMUNITY)
Admission: RE | Disposition: A | Discharge status: Home / Self Care | Payer: Self-pay | Source: Ambulatory Visit | Attending: Urology

## 2012-03-25 ENCOUNTER — Other Ambulatory Visit: Payer: Self-pay | Admitting: Urology

## 2012-03-25 ENCOUNTER — Encounter (HOSPITAL_COMMUNITY): Payer: Self-pay

## 2012-03-25 DIAGNOSIS — N200 Calculus of kidney: Secondary | ICD-10-CM | POA: Insufficient documentation

## 2012-03-25 DIAGNOSIS — Z79899 Other long term (current) drug therapy: Secondary | ICD-10-CM | POA: Insufficient documentation

## 2012-03-25 SURGERY — LITHOTRIPSY, ESWL
Anesthesia: LOCAL | Laterality: Left

## 2012-03-25 MED ORDER — CIPROFLOXACIN HCL 500 MG PO TABS
500.0000 mg | ORAL_TABLET | ORAL | Status: AC
  Administered 2012-03-25: 500 mg via ORAL
  Filled 2012-03-25: qty 1

## 2012-03-25 MED ORDER — DEXTROSE-NACL 5-0.45 % IV SOLN
INTRAVENOUS | Status: DC
  Administered 2012-03-25: 12:00:00 via INTRAVENOUS

## 2012-03-25 MED ORDER — DIPHENHYDRAMINE HCL 25 MG PO CAPS
ORAL_CAPSULE | ORAL | Status: AC
  Administered 2012-03-25: 25 mg via ORAL
  Filled 2012-03-25: qty 1

## 2012-03-25 MED ORDER — DIAZEPAM 5 MG PO TABS
10.0000 mg | ORAL_TABLET | ORAL | Status: AC
  Administered 2012-03-25: 10 mg via ORAL
  Filled 2012-03-25: qty 2

## 2012-03-25 MED ORDER — DIPHENHYDRAMINE HCL 25 MG PO CAPS
25.0000 mg | ORAL_CAPSULE | ORAL | Status: AC
  Administered 2012-03-25: 25 mg via ORAL

## 2012-03-25 NOTE — H&P (Signed)
History of Present Illness  [name] presents today with a hiistory of left flank and low back pain on and off since the first week of August.  She went to the ER at [facility] for severe left flank pain on 8/5.  CT scan showed several left  renal calculi without obstruction.  She was sent home on oral analgesics.  The pain went away but recurred 5 days ago.  She went back to the ER.  KUB showed left reft sided renal calculi.  She presents to our office this morning with moderately severe left flank pain associated with gross hematuria.  She has a past history of kidney stone and had ESL in 2011 and 2012.   Surgical History Problems  1. History of  Appendectomy 2. History of  Cystoscopy With Insertion Of Ureteral Stent 3. History of  Hysterectomy V45.77 4. History of  Lithotripsy  Current Meds 1. Ambien CR 12.5 MG Oral Tablet Extended Release; Therapy: (Recorded:19Sep2013) to 2. EpiPen 0.3 MG/0.3ML Injection Device; Therapy: (Recorded:19Sep2013) to 3. Flomax 0.4 MG Oral Capsule; Therapy: (Recorded:19Sep2013) to 4. OxyCODONE HCl TABS; Therapy: (Recorded:19Sep2013) to 5. TiZANidine HCl CAPS; Therapy: (Recorded:19Sep2013) to 6. Ultram 50 MG Oral Tablet; Therapy: (Recorded:19Sep2013) to  Allergies Medication  1. Aspirin TABS 2. Augmentin TABS 3. Ibuprofen TABS 4. Penicillins Non-Medication  5. Latex  Family History Problems  1. Maternal grandfather's history of  Diabetes Mellitus V18.0 2. Maternal history of  Family Health Status - Father's Age 9 3. Maternal history of  Family Health Status - Mother's Age 44 4. Family history of  Nephrolithiasis 5. Fraternal history of  Prostate Cancer V16.42 6. Maternal grandmother's history of  Thyroid Disorder V18.19  Social History Problems    Alcohol Use occasionally   Marital History - Currently Married   Occupation: Clinical biochemist   Tobacco Use 305.1 1/2 pkfor 15 yrs  Review of Systems Genitourinary,  constitutional, skin, eye, otolaryngeal, hematologic/lymphatic, cardiovascular, pulmonary, endocrine, musculoskeletal, gastrointestinal, neurological and psychiatric system(s) were reviewed and pertinent findings if present are noted.  Genitourinary: dysuria, nocturia and hematuria.  Gastrointestinal: nausea.  ENT: sinus problems.    Vitals Vital Signs [Data Includes: Last 1 Day]  19Sep2013 08:40AM  BMI Calculated: 29.1 BSA Calculated: 2.01 Height: 5 ft 8 in Weight: 192 lb  Blood Pressure: 120 / 92 Temperature: 98.6 F Heart Rate: 71 Respiration: 18  Physical Exam Constitutional: Well nourished and well developed . No acute distress.  ENT:. The ears and nose are normal in appearance.  Neck: The appearance of the neck is normal and no neck mass is present.  Pulmonary: No respiratory distress and normal respiratory rhythm and effort.  Cardiovascular: Heart rate and rhythm are normal . No peripheral edema.  Abdomen: The abdomen is soft and nontender. No masses are palpated. No CVA tenderness. No hernias are palpable. No hepatosplenomegaly noted.  Genitourinary:  Chaperone Present: .  Examination of the external genitalia shows normal female external genitalia and no lesions. The urethra is normal in appearance and not tender. There is no urethral mass. Vaginal exam demonstrates no abnormalities. The adnexa are palpably normal. The bladder is non tender and not distended. The anus is normal on inspection. The perineum is normal on inspection.  Lymphatics: The femoral and inguinal nodes are not enlarged or tender.  Skin: Normal skin turgor, no visible rash and no visible skin lesions.  Neuro/Psych:. Mood and affect are appropriate.    Results/Data Urine [Data Includes: Last 1 Day]   19Sep2013  COLOR YELLOW   APPEARANCE CLEAR   SPECIFIC GRAVITY 1.015   pH 6.0   GLUCOSE NEG mg/dL  BILIRUBIN NEG   KETONE NEG mg/dL  BLOOD MOD   PROTEIN NEG mg/dL  UROBILINOGEN 0.2 mg/dL  NITRITE NEG    LEUKOCYTE ESTERASE TRACE   SQUAMOUS EPITHELIAL/HPF FEW   WBC 3-6 WBC/hpf  RBC 3-6 RBC/hpf  BACTERIA RARE   CRYSTALS NONE SEEN   CASTS NONE SEEN   Other SM. AMT. MUCUS     I independently reviewed the CT scan and the findings are as noted above.   Assessment Assessed  1. Nephrolithiasis Of The Left Kidney 592.0  Plan Health Maintenance (V70.0)  1. UA With REFLEX  Done: 19Sep2013 08:08AM 2. Follow-up Schedule Surgery Office  Follow-up  Done: 19Sep2013   ESL left renal calculus.  The procedure, risks, benefits were explained to the patient.  The risks include but are not limited to hemorrhage, renal or perirenal hematoma, injury to adjacent organs, steinstrasse.  She understands and wishes to proceed.   Signatures Electronically signed by : Su Grand, M.D.; Mar 25 2012  9:48AM

## 2012-03-25 NOTE — Op Note (Signed)
Refer to [facility] Op Note scanned in the chart 

## 2012-03-30 ENCOUNTER — Other Ambulatory Visit: Payer: Self-pay | Admitting: Family Medicine

## 2012-03-30 NOTE — Telephone Encounter (Signed)
Sent!

## 2012-03-30 NOTE — Telephone Encounter (Signed)
Electronic refill request

## 2012-06-01 ENCOUNTER — Other Ambulatory Visit: Payer: Self-pay | Admitting: Family Medicine

## 2012-06-01 NOTE — Telephone Encounter (Signed)
Sent!

## 2012-06-01 NOTE — Telephone Encounter (Signed)
Electronic refill request.  Please advise. 

## 2012-06-27 ENCOUNTER — Other Ambulatory Visit: Payer: Self-pay | Admitting: Family Medicine

## 2012-06-28 NOTE — Telephone Encounter (Signed)
Please call in

## 2012-06-29 NOTE — Telephone Encounter (Signed)
rx called in

## 2012-07-27 ENCOUNTER — Other Ambulatory Visit: Payer: Self-pay | Admitting: Family Medicine

## 2012-07-27 NOTE — Telephone Encounter (Signed)
Electronic refill request, please advise  

## 2012-07-27 NOTE — Telephone Encounter (Signed)
Sent!

## 2012-08-25 ENCOUNTER — Telehealth: Payer: Self-pay | Admitting: Family Medicine

## 2012-08-25 ENCOUNTER — Ambulatory Visit (INDEPENDENT_AMBULATORY_CARE_PROVIDER_SITE_OTHER): Payer: 59 | Admitting: Internal Medicine

## 2012-08-25 ENCOUNTER — Encounter: Payer: Self-pay | Admitting: Internal Medicine

## 2012-08-25 VITALS — BP 110/80 | HR 100 | Temp 98.6°F | Wt 188.0 lb

## 2012-08-25 DIAGNOSIS — A084 Viral intestinal infection, unspecified: Secondary | ICD-10-CM | POA: Insufficient documentation

## 2012-08-25 DIAGNOSIS — A088 Other specified intestinal infections: Secondary | ICD-10-CM

## 2012-08-25 DIAGNOSIS — J019 Acute sinusitis, unspecified: Secondary | ICD-10-CM

## 2012-08-25 MED ORDER — AZITHROMYCIN 250 MG PO TABS: ORAL_TABLET | ORAL | Status: DC

## 2012-08-25 NOTE — Assessment & Plan Note (Signed)
Typical course for norovirus Discussed usual time course Avoid imodium and lactose pedialyte for freq stools Can try probiotic if desired

## 2012-08-25 NOTE — Telephone Encounter (Signed)
Patient Information:  Caller Name: Jeymi  Phone: [phone]  Patient: [name], [name]  Gender: Female  DOB: 1981-01-25  Age: 32 Years  PCP: Crawford Givens Clelia Croft) [facility])  Pregnant: No  Office Follow Up:  Does the office need to follow up with this patient?: No  Instructions For The Office: N/A   Symptoms  Reason For Call & Symptoms: Sinus symptoms / cold symptoms for a week.  Abdominal cramping with diarrhea x 9-10 times since 08/24/12.  Caller states the norovirus is going around at work.  Reviewed Health History In EMR: Yes  Reviewed Medications In EMR: Yes  Reviewed Allergies In EMR: Yes  Reviewed Surgeries / Procedures: Yes  Date of Onset of Symptoms: 08/18/2012  Treatments Tried: Claritin D Tylenol  Treatments Tried Worked: No OB / GYN:  LMP: Unknown  Guideline(s) Used:  Diarrhea  Disposition Per Guideline:   See Today in Office  Reason For Disposition Reached:   Abdominal pain (Exception: pain clears completely with each passage of diarrhea stool)  Advice Given:  Reassurance:  In healthy adults, new-onset diarrhea is usually caused by a viral infection of the intestines, which you can treat at home. Diarrhea is the body's way of getting rid of the infection. Here are some tips on how to keep ahead of the fluid losses.  Fluids:  Drink more fluids, at least 8-10 glasses (8 oz or 240 ml) daily.   Nutrition:  Ideal initial foods include boiled starches/cereals (e.g., potatoes, rice, noodles, wheat, oats) with a small amount of salt to taste.  Other acceptable foods include: bananas, yogurt, crackers, soup.  Call Back If:  You become worse.  Appointment Scheduled:  08/25/2012 11:00:00 Appointment Scheduled Provider:  Tillman Abide [facility])

## 2012-08-25 NOTE — Telephone Encounter (Signed)
See OV note.  

## 2012-08-25 NOTE — Assessment & Plan Note (Signed)
Clear sinus symptoms Worsening after a week Discussed supportive care Avoid antibiotic for now due to diarrhea---Rx given if worsening in several days

## 2012-08-25 NOTE — Patient Instructions (Signed)
Please try pedialyte for rehydration if you continue with frequent loose stools.  If your sinus symptoms are not better by the weekend or next week, or worsen, go ahead and start the antibiotic

## 2012-08-25 NOTE — Progress Notes (Signed)
Subjective:    Patient ID: [name], female    DOB: Apr 12, 1981, 32 y.o.   MRN: [id number]  HPI Has had a cold and sinus symptoms for about a week Stuffiness and rhinorrhea Sore throat Some cough--mostly at night. Mostly dry Some headaches No fever---but has felt warm at times No SOB No ear pain but some post auricular pressure Not improving  Stomach cramps started yesterday Now with loose stools---started last night Had ~9 loose stools No blood Clammy skin Appetite is off--got more cramps after eating last night. Hasn't eaten this AM  Has tried claritin D---did help some congestion and headache  Current Outpatient Prescriptions on File Prior to Visit  Medication Sig Dispense Refill  . EPINEPHrine (EPIPEN) 0.3 mg/0.3 mL DEVI Inject 0.3 mLs (0.3 mg total) into the muscle once. For severe allergic reaction  2 Device  1  . fluticasone (FLONASE) 50 MCG/ACT nasal spray 1-2 sprays each nostril every night at bedtime during allergy season       . tiZANidine (ZANAFLEX) 4 MG tablet Take 1 tablet (4 mg total) by mouth every 6 (six) hours as needed.  180 tablet  1  . traMADol (ULTRAM) 50 MG tablet TAKE 1 TABLET BY MOUTH EVERY 6 HOURS AS NEEDED FOR PAIN  120 tablet  3  . zolpidem (AMBIEN CR) 12.5 MG CR tablet TAKE 1 TABLET BY MOUTH AT BEDTIME AS NEEDED FOR SLEEP  30 tablet  5   No current facility-administered medications on file prior to visit.    Allergies  Allergen Reactions  . Latex     Carries epipen  . Amoxicillin-Pot Clavulanate   . Aspirin   . Ibuprofen   . Nsaids   . Penicillins   . Red Dye     rash    Past Medical History  Diagnosis Date  . GLUCOSE INTOLERANCE 03/19/2009  . COMMON MIGRAINE 03/19/2009  . ALLERGIC RHINITIS 10/11/2009  . HYDRONEPHROSIS, RIGHT 05/11/2009  . RENAL CALCULUS, RIGHT 05/11/2009  . ANKLE PAIN, RIGHT 04/03/2009  . BACK PAIN 03/19/2009  . INSOMNIA-SLEEP DISORDER-UNSPEC 03/19/2009  . FLANK PAIN, RIGHT 05/11/2009  . NEPHROLITHIASIS, HX OF  03/19/2009  . Allergy to latex 04/11/2010    Past Surgical History  Procedure Laterality Date  . Abdominal hysterectomy    . Appendectomy  1994  . Oophorectomy  07/2008  . S/p left knee arthroscopic  1994  . Hx of etopic pregnancy  2005  . Lithotripsy  2011    Family History  Problem Relation Age of Onset  . Alcohol abuse Mother     ETOH  . Bipolar disorder Mother     manic depressive/bipolar  . Thyroid disease Mother     Low thyroid  . Hypertension Father   . Hypertension Other   . Diabetes Other   . Heart disease Other   . Stroke Other   . Cancer Other     Lung Cancer    History   Social History  . Marital Status: Married    Spouse Name: N/A    Number of Children: 0  . Years of Education: N/A   Occupational History  . customer service [employer]    Social History Main Topics  . Smoking status: Former Smoker    Types: Cigarettes  . Smokeless tobacco: Not on file     Comment: quit with chantix 2010  . Alcohol Use: No     Comment: rare  . Drug Use: No  . Sexually Active: Not on file  Other Topics Concern  . Not on file   Social History Narrative   Married to [employer]   Enjoys reading   No kids   Review of Systems No dizziness or syncope No rash    Objective:   Physical Exam  Constitutional: She appears well-developed and well-nourished. No distress.  HENT:  Mouth/Throat: Oropharynx is clear and moist. No oropharyngeal exudate.  Mild frontal sinus tenderness Mild nasal inflammation but moderate swelling TMs normal  Neck: Normal range of motion. Neck supple. No thyromegaly present.  Pulmonary/Chest: Effort normal and breath sounds normal. No respiratory distress. She has no wheezes. She has no rales.  Abdominal: Soft. Bowel sounds are normal. She exhibits no distension and no mass. There is no tenderness. There is no rebound and no guarding.  Musculoskeletal: She exhibits no edema.  Lymphadenopathy:    She has no  cervical adenopathy.          Assessment & Plan:

## 2012-08-30 ENCOUNTER — Other Ambulatory Visit: Payer: Self-pay | Admitting: Family Medicine

## 2012-08-30 NOTE — Telephone Encounter (Signed)
Electronic refill request.  Please advise. 

## 2012-08-31 NOTE — Telephone Encounter (Signed)
Rx phoned to pharmacy.  

## 2012-08-31 NOTE — Telephone Encounter (Signed)
Please call in.  Thanks.   

## 2012-11-21 ENCOUNTER — Other Ambulatory Visit: Payer: Self-pay | Admitting: Family Medicine

## 2012-11-23 NOTE — Telephone Encounter (Signed)
Electronic refill request.  Please advise. 

## 2012-11-23 NOTE — Telephone Encounter (Signed)
Sent. Thanks.   

## 2012-12-21 ENCOUNTER — Other Ambulatory Visit: Payer: Self-pay | Admitting: Family Medicine

## 2012-12-21 NOTE — Telephone Encounter (Signed)
Received refill electronically. Last office visit 08/25/12. Is it okay to refill medication?

## 2012-12-21 NOTE — Telephone Encounter (Signed)
rx called into pharmacy

## 2012-12-21 NOTE — Telephone Encounter (Signed)
Schedule a CPE for this fall. Please call in.  Thanks.

## 2012-12-24 ENCOUNTER — Emergency Department: Payer: Self-pay | Admitting: Unknown Physician Specialty

## 2012-12-24 LAB — CBC
HCT: 41.6 % (ref 35.0–47.0)
HGB: 14.1 g/dL (ref 12.0–16.0)
MCHC: 33.9 g/dL (ref 32.0–36.0)
MCV: 83 fL (ref 80–100)
Platelet: 372 10*3/uL (ref 150–440)
RBC: 5.01 10*6/uL (ref 3.80–5.20)
RDW: 13 % (ref 11.5–14.5)

## 2012-12-24 LAB — URINALYSIS, COMPLETE
Bacteria: NONE SEEN
Bilirubin,UR: NEGATIVE
Glucose,UR: NEGATIVE mg/dL (ref 0–75)
Ketone: NEGATIVE
Nitrite: NEGATIVE
Ph: 5 (ref 4.5–8.0)
RBC,UR: 340 /HPF (ref 0–5)
Squamous Epithelial: 1
WBC UR: 7 /HPF (ref 0–5)

## 2012-12-24 LAB — LIPASE, BLOOD: Lipase: 343 U/L (ref 73–393)

## 2012-12-24 LAB — COMPREHENSIVE METABOLIC PANEL
Albumin: 4 g/dL (ref 3.4–5.0)
Anion Gap: 4 — ABNORMAL LOW (ref 7–16)
Calcium, Total: 9.5 mg/dL (ref 8.5–10.1)
Chloride: 103 mmol/L (ref 98–107)
Creatinine: 1.01 mg/dL (ref 0.60–1.30)
EGFR (African American): >60
EGFR (Non-African Amer.): >60
Glucose: 90 mg/dL (ref 65–99)
Osmolality: 274 (ref 275–301)
SGOT(AST): 23 U/L (ref 15–37)
Sodium: 137 mmol/L (ref 136–145)

## 2012-12-28 ENCOUNTER — Encounter: Payer: Self-pay | Admitting: Family Medicine

## 2012-12-28 ENCOUNTER — Encounter: Payer: Self-pay | Admitting: *Deleted

## 2012-12-28 ENCOUNTER — Ambulatory Visit (INDEPENDENT_AMBULATORY_CARE_PROVIDER_SITE_OTHER): Payer: 59 | Admitting: Family Medicine

## 2012-12-28 VITALS — BP 100/80 | HR 68 | Temp 98.6°F | Wt 194.0 lb

## 2012-12-28 DIAGNOSIS — R1031 Right lower quadrant pain: Secondary | ICD-10-CM | POA: Insufficient documentation

## 2012-12-28 DIAGNOSIS — R509 Fever, unspecified: Secondary | ICD-10-CM | POA: Insufficient documentation

## 2012-12-28 DIAGNOSIS — J019 Acute sinusitis, unspecified: Secondary | ICD-10-CM

## 2012-12-28 DIAGNOSIS — Z87442 Personal history of urinary calculi: Secondary | ICD-10-CM

## 2012-12-28 DIAGNOSIS — R300 Dysuria: Secondary | ICD-10-CM

## 2012-12-28 LAB — POCT URINALYSIS DIPSTICK
Glucose, UA: NEGATIVE
Nitrite, UA: NEGATIVE
Urobilinogen, UA: NEGATIVE

## 2012-12-28 LAB — POCT UA - MICROSCOPIC ONLY

## 2012-12-28 NOTE — Assessment & Plan Note (Addendum)
Unclear origin.. Not definitely a sinus infection... Minimal sinus pressure, quick onset. ? If fever is from upper resp tract infection like virus, mono etc given lymphadenopathy .. (strep negative) OR if it is source in urinary tract... Pt with hematuria, and recent hydronephrosis from possible stone last week in ER... ? If passing stone causing residual pain and blood or infection.  Pt with hx of multi drug resistant UTI.Marland Kitchen Send urine for culture.  Will eval with cbc diff, monospot.  Levaquin seems to be inadequate given continued high fevers. Given ceftriaxone in office today IM 1 g to cover possible UTI/pyelo, ? resistant  Close follow up tomorrow.

## 2012-12-28 NOTE — Assessment & Plan Note (Signed)
?   Passing stone vs Urinary infection versus other.  If not improving .Marland Kitchen May need further imaging to rule of appendicitis etc.

## 2012-12-28 NOTE — Patient Instructions (Addendum)
Stop at lab on your way out. IM ceftriaxone given in office today. We will call with lab results and urine results. Can use tramadol for headache. Make appt .Marland Kitchen Work in to my appts in afternoon tomorrow. If symptoms worsening, cannot tolerated oral intake... Go to ER.

## 2012-12-28 NOTE — Progress Notes (Signed)
  Subjective:    Patient ID: [name], female    DOB: [birth year], 32 y.o.   MRN: [id number]  HPI  32 year old female patient presents following 5 days of   Started with right lower back pain... Went to  [facility] ER 5 days ago: had blood in urine, no clear infection. [place] some dilation of ureter, no stone seen.  Has history of kidney stones.. Given percocet to treat likely kidney stones.  Only took 3 tabs of percocet.. Makes her nauseous.  2 days later started with congestion, headache, B temporal, fever was103.5, ST, lightheaded. Went to urgent care [facility]: Dx with acute sinus infection. Started on Levaquin. No labs done.   She is on day 3/10 of levaquin.  Tylenol for fever.  Now continued congestion, pressure in sinuses, headache, continued fever up and down. Last temp 102 11 AM this morning. Some body aches. Now in last day she has had urinary pressure. Mild dysuria, some urinary frequency, no urgency No further back pain, occ nausea.   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 Last saw URO 8-03/2012 for UTI/lithotripsy. She have hospitalized for pyelonephritis in past x 2, 2005 and 2010    Review of Systems  Constitutional: Positive for fever and fatigue.  HENT: Negative for ear pain.   Eyes: Negative for pain.  Respiratory: Negative for chest tightness and shortness of breath.   Cardiovascular: Negative for chest pain, palpitations and leg swelling.  Gastrointestinal: Negative for abdominal pain.  Genitourinary: Negative for dysuria.       Objective:   Physical Exam  Constitutional: She appears well-developed.  fatigued appearing  HENT:  Head: Normocephalic.  Right Ear: Hearing, tympanic membrane and external ear normal. No middle ear effusion.  Left Ear: Hearing, tympanic membrane and external ear normal.  No middle ear effusion.  Nose: Mucosal edema present. No rhinorrhea. Right sinus exhibits no maxillary sinus tenderness and no  frontal sinus tenderness. Left sinus exhibits no maxillary sinus tenderness and no frontal sinus tenderness.  Mouth/Throat: Normal dentition. Posterior oropharyngeal edema and posterior oropharyngeal erythema present. No oropharyngeal exudate.  Tonsil bled a litlle during swab per CMA  Eyes: Conjunctivae are normal. Pupils are equal, round, and reactive to light. Right eye exhibits no discharge. Left eye exhibits no discharge.  Neck: Normal range of motion. Neck supple. No thyromegaly present.  Cardiovascular: Regular rhythm and normal pulses.   Pulmonary/Chest: Effort normal and breath sounds normal.  Abdominal: Soft. Normal appearance. There is tenderness in the suprapubic area and left lower quadrant. There is CVA tenderness.  Lymphadenopathy:       Head (right side): Tonsillar adenopathy present.       Head (left side): Tonsillar adenopathy present.    She has cervical adenopathy.          Assessment & Plan:

## 2012-12-29 ENCOUNTER — Telehealth: Payer: Self-pay | Admitting: Radiology

## 2012-12-29 ENCOUNTER — Encounter: Payer: Self-pay | Admitting: *Deleted

## 2012-12-29 ENCOUNTER — Encounter: Payer: Self-pay | Admitting: Family Medicine

## 2012-12-29 ENCOUNTER — Ambulatory Visit (INDEPENDENT_AMBULATORY_CARE_PROVIDER_SITE_OTHER)
Admission: RE | Admit: 2012-12-29 | Discharge: 2012-12-29 | Disposition: A | Discharge status: Home / Self Care | Payer: 59 | Source: Ambulatory Visit | Attending: Family Medicine | Admitting: Family Medicine

## 2012-12-29 ENCOUNTER — Ambulatory Visit (INDEPENDENT_AMBULATORY_CARE_PROVIDER_SITE_OTHER): Payer: 59 | Admitting: Family Medicine

## 2012-12-29 VITALS — BP 100/80 | HR 84 | Temp 97.9°F | Wt 195.0 lb

## 2012-12-29 DIAGNOSIS — R1031 Right lower quadrant pain: Secondary | ICD-10-CM

## 2012-12-29 DIAGNOSIS — R509 Fever, unspecified: Secondary | ICD-10-CM

## 2012-12-29 DIAGNOSIS — J069 Acute upper respiratory infection, unspecified: Secondary | ICD-10-CM

## 2012-12-29 DIAGNOSIS — R300 Dysuria: Secondary | ICD-10-CM

## 2012-12-29 DIAGNOSIS — K5900 Constipation, unspecified: Secondary | ICD-10-CM

## 2012-12-29 DIAGNOSIS — J019 Acute sinusitis, unspecified: Secondary | ICD-10-CM

## 2012-12-29 DIAGNOSIS — N10 Acute tubulo-interstitial nephritis: Secondary | ICD-10-CM

## 2012-12-29 LAB — CBC WITH DIFFERENTIAL/PLATELET
Basophils Relative: 0.2 % (ref 0.0–3.0)
Eosinophils Absolute: 0.1 10*3/uL (ref 0.0–0.7)
Lymphocytes Relative: 13.2 % (ref 12.0–46.0)
MCHC: 33.3 g/dL (ref 30.0–36.0)
Neutrophils Relative %: 76.1 % (ref 43.0–77.0)
RBC: 4.42 Mil/uL (ref 3.87–5.11)
WBC: 21.5 10*3/uL (ref 4.5–10.5)

## 2012-12-29 LAB — MONONUCLEOSIS SCREEN: Mono Screen: NEGATIVE

## 2012-12-29 MED ORDER — CEFTRIAXONE SODIUM 1 G IJ SOLR
1.0000 g | Freq: Once | INTRAMUSCULAR | Status: AC
  Administered 2012-12-29: 1 g via INTRAMUSCULAR

## 2012-12-29 MED ORDER — CIPROFLOXACIN HCL 500 MG PO TABS: 500.0000 mg | ORAL_TABLET | Freq: Two times a day (BID) | ORAL | Status: DC

## 2012-12-29 NOTE — Assessment & Plan Note (Signed)
Treat with mirilax daily to regualr BMs.

## 2012-12-29 NOTE — Progress Notes (Signed)
  Subjective:    Patient ID: [name], female    DOB: 1981-03-25, 32 y.o.   MRN: [id number]  HPI  32 year old female with history of nephrolithiasis, multi drug resistant UTI and  pyelonephritis several years ago presents for 1 day follow up on fever of unknown origin.. ? URI or due to urinary issue or other. Unresponsive to levaquin she was placed on for presumed sinus infection x 3 days ( had ST, headache and fevers) She had had 1 week of possible nephrolithiasis ( no stone seen on [place], but had hydronephrosis on right).. Pain had improved.. But was having some mild dysuria.  Yesterday UA showed hematuria, minimal UA  Strep negative. Mono negative.  Given ceftriaxone 1 g x 1 IM. WBC returned high at 21, left shift. No anemia.  Urine culture remains pending.  Today she reports that she has some tenderness over her bladder, no dysuria. No RIGht lower quadrant pain. No longer with right CVA tenderness. No vomiting, No Diarrhea, no constipation.  Continued headache and uri symptoms. No sinus pressure. No ear pain. Clear nasal discharge.  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 S/P full hysterectomy..polycystic ovarian syndrome, endometriosis    Review of Systems  Constitutional: Positive for fatigue. Negative for fever.       Fevers have resolved  HENT: Negative for ear pain.   Eyes: Negative for pain.  Respiratory: Negative for chest tightness and shortness of breath.   Cardiovascular: Negative for chest pain, palpitations and leg swelling.  Gastrointestinal: Negative for abdominal pain.  Genitourinary: Negative for dysuria.       Objective:   Physical Exam  Constitutional: She appears well-developed.  fatigued appearing  HENT:  Head: Normocephalic.  Right Ear: Hearing, tympanic membrane and external ear normal. No middle ear effusion.  Left Ear: Hearing, tympanic membrane and external ear normal.  No middle ear effusion.  Nose: Mucosal edema present. No rhinorrhea. Right sinus exhibits no  maxillary sinus tenderness and no frontal sinus tenderness. Left sinus exhibits no maxillary sinus tenderness and no frontal sinus tenderness.  Mouth/Throat: Normal dentition. Posterior oropharyngeal edema and posterior oropharyngeal erythema present. No oropharyngeal exudate.  Tonsil bled a litlle during swab per CMA  Eyes: Conjunctivae are normal. Pupils are equal, round, and reactive to light. Right eye exhibits no discharge. Left eye exhibits no discharge.  Neck: Normal range of motion. Neck supple. No thyromegaly present.  Cardiovascular: Regular rhythm and normal pulses.   Pulmonary/Chest: Effort normal and breath sounds normal.  Abdominal: Soft. Normal appearance. There is tenderness in the suprapubic area. There is no CVA tenderness.  Lymphadenopathy:       Head (right side): Tonsillar adenopathy present.       Head (left side): Tonsillar adenopathy present.    She has cervical adenopathy.          Assessment & Plan:

## 2012-12-29 NOTE — Patient Instructions (Addendum)
Hold muscle relaxant while on antibiotics. Start cipro 500 mg BID x 10 days. Ceftriaxone 1 g given today in office.  Use miralax daily for constipation. Increase fiber and water, We will call with urine culture and final X-ray results.  Follow up appt on Friday with Dr. Ermalene Searing

## 2012-12-29 NOTE — Telephone Encounter (Signed)
F/u today with PCP

## 2012-12-29 NOTE — Addendum Note (Signed)
Addended by: Eliezer Bottom on: 12/29/2012 04:29 PM   Modules accepted: Orders

## 2012-12-29 NOTE — Assessment & Plan Note (Addendum)
No clear stone on KUB, but lots of stool. Now most consistent with pyelo.Marland Kitchen Urine culture still pending. Will give another dose ceftriaxone and start oral cipro.

## 2012-12-29 NOTE — Telephone Encounter (Signed)
[facility] called a critical WBC - 21.5, results given to Dr Patsy Lager

## 2012-12-29 NOTE — Addendum Note (Signed)
Addended by: Consuello Masse on: 12/29/2012 07:56 AM   Modules accepted: Orders

## 2012-12-29 NOTE — Assessment & Plan Note (Signed)
Most likely cause of upper respiratory symptoms. Neg mono and strep. Symptomatic care.

## 2012-12-30 ENCOUNTER — Encounter: Payer: Self-pay | Admitting: Radiology

## 2012-12-30 LAB — URINE CULTURE
Colony Count: NO GROWTH
Organism ID, Bacteria: NO GROWTH

## 2012-12-31 ENCOUNTER — Telehealth: Payer: Self-pay | Admitting: *Deleted

## 2012-12-31 ENCOUNTER — Ambulatory Visit (INDEPENDENT_AMBULATORY_CARE_PROVIDER_SITE_OTHER): Payer: 59 | Admitting: Family Medicine

## 2012-12-31 ENCOUNTER — Encounter: Payer: Self-pay | Admitting: Family Medicine

## 2012-12-31 ENCOUNTER — Ambulatory Visit (INDEPENDENT_AMBULATORY_CARE_PROVIDER_SITE_OTHER)
Admission: RE | Admit: 2012-12-31 | Discharge: 2012-12-31 | Disposition: A | Discharge status: Home / Self Care | Payer: 59 | Source: Ambulatory Visit | Attending: Family Medicine | Admitting: Family Medicine

## 2012-12-31 VITALS — BP 112/82 | HR 101 | Temp 98.8°F | Wt 196.0 lb

## 2012-12-31 DIAGNOSIS — R509 Fever, unspecified: Secondary | ICD-10-CM

## 2012-12-31 DIAGNOSIS — N200 Calculus of kidney: Secondary | ICD-10-CM

## 2012-12-31 DIAGNOSIS — R319 Hematuria, unspecified: Secondary | ICD-10-CM

## 2012-12-31 LAB — POCT URINALYSIS DIPSTICK
Glucose, UA: NEGATIVE
Leukocytes, UA: NEGATIVE
Nitrite, UA: NEGATIVE
Spec Grav, UA: 1.02
Urobilinogen, UA: NEGATIVE

## 2012-12-31 LAB — CBC WITH DIFFERENTIAL/PLATELET
Basophils Relative: 0.2 % (ref 0.0–3.0)
Eosinophils Relative: 1.7 % (ref 0.0–5.0)
Lymphocytes Relative: 32 % (ref 12.0–46.0)
MCV: 85.1 fl (ref 78.0–100.0)
Monocytes Relative: 9.4 % (ref 3.0–12.0)
Neutrophils Relative %: 56.7 % (ref 43.0–77.0)
RBC: 4.35 Mil/uL (ref 3.87–5.11)
WBC: 9.3 10*3/uL (ref 4.5–10.5)

## 2012-12-31 MED ORDER — HYDROCODONE-ACETAMINOPHEN 5-500 MG PO TABS: 1.0000 | ORAL_TABLET | Freq: Four times a day (QID) | ORAL | Status: DC | PRN

## 2012-12-31 MED ORDER — FLUCONAZOLE 150 MG PO TABS: 150.0000 mg | ORAL_TABLET | Freq: Once | ORAL | Status: DC

## 2012-12-31 MED ORDER — CEFDINIR 300 MG PO CAPS: 300.0000 mg | ORAL_CAPSULE | Freq: Two times a day (BID) | ORAL | Status: DC

## 2012-12-31 NOTE — Telephone Encounter (Signed)
Patient says she thinks she has a yeast infection and wants to know if something can be called in and also patient wants to know if there is something stronger then tramadol but not as strong as percocet(this makes her sick)  [facility]ses [facility]

## 2012-12-31 NOTE — Telephone Encounter (Addendum)
Also willls end in diflucan for yeast .. Take one now and the second after compoletd cousrse of antibiotics...as long as she has tolerated this in the past.. It appears there may be a cross reactivity to red dye.  If she has not taken this before... Have her use PTC monistat instead. Wil try hydrocodone for pain.

## 2012-12-31 NOTE — Patient Instructions (Addendum)
Stop at front desk to set up referral for imaging as well as at lab for labs. We will call you with the results. Continue cipro for now.

## 2012-12-31 NOTE — Assessment & Plan Note (Signed)
?   2mm on KUB. Likely cause of abdominal pain but with associated fever.. Will image with CT to make sure no associated infection/abcess.  Continue cipro for now.. May D/C if abdominal/pelvic CT neg  Return to see URO.

## 2012-12-31 NOTE — Addendum Note (Signed)
Addended by: Consuello Masse on: 12/31/2012 03:34 PM   Modules accepted: Orders

## 2012-12-31 NOTE — Telephone Encounter (Signed)
Notify pt nml CT except :  Distal right ureteral stone measures 4 mm.  There is mild right hydronephrosis and hydroureter.  If pain not improving, make appt to see URO early next week. Go ahead and stop cipro as no urinary infection. We will have [name] start cephalexin for 7 days to complete a course of a medicaiton that is similar to the injections that she seemed to respond to for possible upper respiratory bacterial infeciton.

## 2012-12-31 NOTE — Progress Notes (Signed)
Subjective:    Patient ID: [name], female    DOB: 1980/08/29, 32 y.o.   MRN: [id number]  HPI 32 year old female with history of nephrolithiasis, multi drug resistant UTI and pyelonephritis several years ago presents for 3 day follow up on fever of unknown origin.. ? URI or due to urinary issue or other. Unresponsive to levaquin she was placed on for presumed sinus infection x 3 days ( had ST, headache and fevers)  She had had 1 week of possible nephrolithiasis ( no stone seen on [place], but had hydronephrosis on right).. Pain had improved.. But was having some mild dysuria.  Yesterday UA showed hematuria, minimal UA  Strep negative. Mono negative.  Given ceftriaxone 1 g x 1 IM.  WBC returned high at 21, left shift. No anemia.  Urine culture remains pending.  2 days ago she returned for follow up:  she reported that she has some tenderness over her bladder, no dysuria. No RIGht lower quadrant pain.  No longer with right CVA tenderness.  No further fever. No vomiting, No Diarrhea, no constipation.  At that OV another ceftriaxone injection was given and KUB shoed possible right ureteral 2 mm stone.  We felt we were dealing with pyelonephritis and viral URI.Marland Kitchen So she was placed on cipro 500 mg BID.  Unfortunately  Urine culture has now returned negative... Making pyelo not the cause of her fever and elevated wbc.  TODAY: she reports she is still having central bladder pain.  Her throat is still sore but mildly so, still some drainage of mucus. Still with headaches but not as severe.  No further fever.   Review of Systems  Constitutional: Negative for fever and fatigue.  HENT: Positive for congestion, sore throat and postnasal drip. Negative for ear pain, trouble swallowing and sinus pressure.   Eyes: Negative for pain.  Respiratory: Negative for chest tightness and shortness of breath.   Cardiovascular: Negative for chest pain, palpitations and leg swelling.  Gastrointestinal:  Negative for abdominal pain.  Genitourinary: Negative for dysuria.       Objective:   Physical Exam  Constitutional: Vital signs are normal. She appears well-developed and well-nourished. She is cooperative.  Non-toxic appearance. She does not appear ill. No distress.  HENT:  Head: Normocephalic.  Right Ear: Hearing, tympanic membrane, external ear and ear canal normal. Tympanic membrane is not erythematous, not retracted and not bulging.  Left Ear: Hearing, tympanic membrane, external ear and ear canal normal. Tympanic membrane is not erythematous, not retracted and not bulging.  Nose: Mucosal edema and rhinorrhea present. Right sinus exhibits no maxillary sinus tenderness and no frontal sinus tenderness. Left sinus exhibits no maxillary sinus tenderness and no frontal sinus tenderness.  Mouth/Throat: Uvula is midline, oropharynx is clear and moist and mucous membranes are normal.  Eyes: Conjunctivae, EOM and lids are normal. Pupils are equal, round, and reactive to light. No foreign bodies found.  Neck: Trachea normal and normal range of motion. Neck supple. Carotid bruit is not present. No mass and no thyromegaly present.  Cardiovascular: Normal rate, regular rhythm, S1 normal, S2 normal, normal heart sounds, intact distal pulses and normal pulses.  Exam reveals no gallop and no friction rub.   No murmur heard. Pulmonary/Chest: Effort normal and breath sounds normal. Not tachypneic. No respiratory distress. She has no decreased breath sounds. She has no wheezes. She has no rhonchi. She has no rales.  Abdominal: Soft. Normal appearance and bowel sounds are normal. There is no hepatosplenomegaly.  There is tenderness in the suprapubic area. There is no rebound and no CVA tenderness.  Neurological: She is alert.  Skin: Skin is warm, dry and intact. No rash noted.  Psychiatric: Her speech is normal and behavior is normal. Judgment and thought content normal. Her mood appears not anxious.  Cognition and memory are normal. She does not exhibit a depressed mood.          Assessment & Plan:

## 2012-12-31 NOTE — Addendum Note (Signed)
Addended by: Kerby Nora E on: 12/31/2012 03:28 PM   Modules accepted: Orders, Medications

## 2012-12-31 NOTE — Telephone Encounter (Signed)
Patient advised and medication sent to the pharmacy

## 2012-12-31 NOTE — Assessment & Plan Note (Addendum)
Given urine culture negative... Pyelonephritis is not cause of fever. Continue cipro for now.  Re-eval wbcs.. Likely normalizing on ceftriaxone injections. Will image abdomen to make sure no abcess or other cause of fever.  Neg strep and mono.Marland Kitchen URI now resolving... ? If this was cause of elevated wbc and fever.. May need to cahnge to cephalosprin to complete treatment.

## 2013-02-16 ENCOUNTER — Other Ambulatory Visit: Payer: Self-pay | Admitting: Family Medicine

## 2013-02-16 NOTE — Telephone Encounter (Signed)
Received refill request electronically. Last office visit 12/31/12 (acute). Is it okay to refill medication?

## 2013-02-16 NOTE — Telephone Encounter (Signed)
Sent. Thanks.   

## 2013-03-14 ENCOUNTER — Encounter: Payer: Self-pay | Admitting: Radiology

## 2013-03-15 ENCOUNTER — Encounter: Payer: Self-pay | Admitting: Family Medicine

## 2013-03-15 ENCOUNTER — Ambulatory Visit (INDEPENDENT_AMBULATORY_CARE_PROVIDER_SITE_OTHER): Payer: 59 | Admitting: Family Medicine

## 2013-03-15 VITALS — BP 108/72 | HR 88 | Temp 98.7°F | Wt 204.2 lb

## 2013-03-15 DIAGNOSIS — T148XXA Other injury of unspecified body region, initial encounter: Secondary | ICD-10-CM

## 2013-03-15 MED ORDER — DIAZEPAM 5 MG PO TABS: 5.0000 mg | ORAL_TABLET | Freq: Three times a day (TID) | ORAL | Status: DC | PRN

## 2013-03-15 NOTE — Patient Instructions (Signed)
Gently stretch your neck.  Use the valium instead of your regular meds.  Check your mouse and seat position.  Keep using heat.  This should get better.

## 2013-03-15 NOTE — Progress Notes (Signed)
Thursday AM I had a "crick in my neck", pain with rotation to the right.  No pain with rotation to the left.  Pain along the L upper trap.  No trigger known.  Woke up with pain.  No new pillow or other cause known. No arm or shoulder sx.  Some help with tizanidine, but it doesn't relive.  Using heat, some help.  Using icy hot patches.  No leg sx.    Meds, vitals, and allergies reviewed.   ROS: See HPI.  Otherwise, noncontributory.  nad but uncomfortable Mmm rrr ctab Neck supple with no LA but insertion of R SCM ttp.  R trap ttp.  Not ttp on midline or L side.   Normal motor and sensation x4 No rash

## 2013-03-16 DIAGNOSIS — T148XXA Other injury of unspecified body region, initial encounter: Secondary | ICD-10-CM | POA: Insufficient documentation

## 2013-03-16 NOTE — Assessment & Plan Note (Signed)
Not resolved with current meds.  Continue heat, d/w pt about stretching and use valium prn in meantime with sedation caution. She agrees.  F/u prn.  No meningismus.

## 2013-03-22 ENCOUNTER — Encounter (INDEPENDENT_AMBULATORY_CARE_PROVIDER_SITE_OTHER): Payer: 59 | Admitting: Ophthalmology

## 2013-03-22 DIAGNOSIS — H33309 Unspecified retinal break, unspecified eye: Secondary | ICD-10-CM

## 2013-03-22 DIAGNOSIS — H43819 Vitreous degeneration, unspecified eye: Secondary | ICD-10-CM

## 2013-03-22 DIAGNOSIS — H4310 Vitreous hemorrhage, unspecified eye: Secondary | ICD-10-CM

## 2013-03-22 DIAGNOSIS — H2510 Age-related nuclear cataract, unspecified eye: Secondary | ICD-10-CM

## 2013-03-23 ENCOUNTER — Other Ambulatory Visit: Payer: Self-pay | Admitting: Family Medicine

## 2013-03-23 DIAGNOSIS — G8929 Other chronic pain: Secondary | ICD-10-CM | POA: Insufficient documentation

## 2013-03-23 NOTE — Telephone Encounter (Signed)
Pt left v/m cking on status of tramadol refill; pt thought [facility] requested on 03/20/13. Pt request cb when refilled.

## 2013-03-23 NOTE — Telephone Encounter (Signed)
Please call in. Thanks. I don't see the request from 03/20/13.  Thanks.

## 2013-03-23 NOTE — Telephone Encounter (Signed)
Received refill request electronically. Last office visit 03/15/13. Is it okay to refill medication?

## 2013-03-23 NOTE — Telephone Encounter (Signed)
Rx called to pharmacy. Patient notified by telephone that script has been called in. 

## 2013-03-30 ENCOUNTER — Ambulatory Visit (INDEPENDENT_AMBULATORY_CARE_PROVIDER_SITE_OTHER): Payer: 59 | Admitting: Ophthalmology

## 2013-03-30 DIAGNOSIS — H33309 Unspecified retinal break, unspecified eye: Secondary | ICD-10-CM

## 2013-05-12 ENCOUNTER — Other Ambulatory Visit: Payer: Self-pay

## 2013-05-30 ENCOUNTER — Other Ambulatory Visit: Payer: Self-pay | Admitting: Family Medicine

## 2013-05-30 NOTE — Telephone Encounter (Signed)
Electronic refill request.  Please advise. 

## 2013-05-30 NOTE — Telephone Encounter (Signed)
Sent!

## 2013-06-15 ENCOUNTER — Encounter: Payer: Self-pay | Admitting: Family Medicine

## 2013-06-15 ENCOUNTER — Telehealth: Payer: Self-pay | Admitting: Family Medicine

## 2013-06-15 ENCOUNTER — Ambulatory Visit (INDEPENDENT_AMBULATORY_CARE_PROVIDER_SITE_OTHER): Payer: 59 | Admitting: Family Medicine

## 2013-06-15 VITALS — BP 104/74 | HR 96 | Temp 98.5°F | Wt 209.5 lb

## 2013-06-15 DIAGNOSIS — R05 Cough: Secondary | ICD-10-CM

## 2013-06-15 DIAGNOSIS — R059 Cough, unspecified: Secondary | ICD-10-CM

## 2013-06-15 MED ORDER — ZOLPIDEM TARTRATE ER 12.5 MG PO TBCR: EXTENDED_RELEASE_TABLET | ORAL | Status: DC

## 2013-06-15 MED ORDER — AZITHROMYCIN 250 MG PO TABS: ORAL_TABLET | ORAL | Status: DC

## 2013-06-15 MED ORDER — GUAIFENESIN-CODEINE 100-10 MG/5ML PO SYRP: 10.0000 mL | ORAL_SOLUTION | Freq: Every evening | ORAL | Status: DC | PRN

## 2013-06-15 NOTE — Telephone Encounter (Signed)
Notified pt to come in at 2:45. Will add her to the schedule once someone opens a slot.

## 2013-06-15 NOTE — Patient Instructions (Signed)
Start the zithromax today and use the cough syrup/inhaler as needed.  This should gradually improve.  Take care.

## 2013-06-15 NOTE — Telephone Encounter (Signed)
Pt states she was seen at the [facility] in [place] last Friday, 06/10/2013.  She says they diagnosed her w/the flu and bronchitis.  Pt says she was given albuterol, tessalon perles for daytime use, and cheratussin for night time use.  Pt is still not getting any better, is actually getting worse and says she is now coughing up bloody mucus.  No one else has anything available to see her, can you open something for her or does she need to go to Urgent Care? Thank you.

## 2013-06-15 NOTE — Progress Notes (Signed)
Pre-visit discussion using our clinic review tool. No additional management support is needed unless otherwise documented below in the visit note.  Sx started weekend after thanksgiving.  Seen about 1 week later at [facility].  dx'd flu (pos test per patient), given tessalon, cheratussin, and albuterol.  SABA helps the cough some. Tessalon didn't help.  Cheratussin helps some at night.  She slowly felt better until yesterday, then she felt worse with progressive cough today, today with blood-tinged mucous, not frank blood.  No fevers since Monday AM.  Some wheeze.  Last SABA about 7 hours ago.    Insomnia. Needed refill on ambien.  No ADE.  Not used with other cold meds as above while sick.    Meds, vitals, and allergies reviewed.   ROS: See HPI.  Otherwise, noncontributory.  GEN: nad, alert and oriented HEENT: mucous membranes moist, tm w/o erythema, nasal exam w/o erythema, clear discharge noted,  OP with cobblestoning NECK: supple w/o LA CV: rrr.   PULM: ctab, no inc wob, dry cough noted.  EXT: no edema SKIN: no acute rash

## 2013-06-15 NOTE — Telephone Encounter (Signed)
Please see about getting her on at 2:45.  Thanks.

## 2013-06-16 DIAGNOSIS — R05 Cough: Secondary | ICD-10-CM | POA: Insufficient documentation

## 2013-06-16 DIAGNOSIS — R059 Cough, unspecified: Secondary | ICD-10-CM | POA: Insufficient documentation

## 2013-06-16 NOTE — Assessment & Plan Note (Signed)
Nontoxic.  Recent flu pos, was improving, now with more cough and sputum.  Start zmax today, continue SABA and cough syrup and this should resolve.  Concern for post viral bronchitis.  Blood tinged sputum, not a frank bleed.  Likely from upper airway irritation from the cough, not high enough for me to see on OP exam today.   No stridor. No inc in wob.  F/u prn.

## 2013-07-14 ENCOUNTER — Other Ambulatory Visit: Payer: Self-pay | Admitting: Family Medicine

## 2013-07-14 NOTE — Telephone Encounter (Signed)
Electronic refill request.  Please advise. 

## 2013-07-15 NOTE — Telephone Encounter (Signed)
Rx called in as prescribed 

## 2013-07-15 NOTE — Telephone Encounter (Signed)
Will refill once in PCP absence  Px written for call in   

## 2013-08-01 ENCOUNTER — Ambulatory Visit (INDEPENDENT_AMBULATORY_CARE_PROVIDER_SITE_OTHER): Payer: 59 | Admitting: Ophthalmology

## 2013-08-12 ENCOUNTER — Encounter: Payer: Self-pay | Admitting: Radiology

## 2013-08-15 ENCOUNTER — Ambulatory Visit (INDEPENDENT_AMBULATORY_CARE_PROVIDER_SITE_OTHER): Payer: 59 | Admitting: Ophthalmology

## 2013-08-15 ENCOUNTER — Encounter: Payer: Self-pay | Admitting: Internal Medicine

## 2013-08-15 ENCOUNTER — Telehealth: Payer: Self-pay

## 2013-08-15 ENCOUNTER — Ambulatory Visit (INDEPENDENT_AMBULATORY_CARE_PROVIDER_SITE_OTHER): Payer: 59 | Admitting: Internal Medicine

## 2013-08-15 VITALS — BP 118/80 | HR 81 | Temp 98.6°F | Ht 67.0 in | Wt 204.0 lb

## 2013-08-15 DIAGNOSIS — M549 Dorsalgia, unspecified: Secondary | ICD-10-CM

## 2013-08-15 DIAGNOSIS — G4700 Insomnia, unspecified: Secondary | ICD-10-CM

## 2013-08-15 DIAGNOSIS — N393 Stress incontinence (female) (male): Secondary | ICD-10-CM | POA: Insufficient documentation

## 2013-08-15 DIAGNOSIS — E669 Obesity, unspecified: Secondary | ICD-10-CM | POA: Insufficient documentation

## 2013-08-15 DIAGNOSIS — K5900 Constipation, unspecified: Secondary | ICD-10-CM

## 2013-08-15 DIAGNOSIS — Z0000 Encounter for general adult medical examination without abnormal findings: Secondary | ICD-10-CM

## 2013-08-15 DIAGNOSIS — Z23 Encounter for immunization: Secondary | ICD-10-CM

## 2013-08-15 DIAGNOSIS — F411 Generalized anxiety disorder: Secondary | ICD-10-CM | POA: Insufficient documentation

## 2013-08-15 DIAGNOSIS — G8929 Other chronic pain: Secondary | ICD-10-CM

## 2013-08-15 LAB — COMPREHENSIVE METABOLIC PANEL
ALBUMIN: 4.3 g/dL (ref 3.5–5.2)
ALK PHOS: 74 U/L (ref 39–117)
ALT: 36 U/L — ABNORMAL HIGH (ref 0–35)
AST: 30 U/L (ref 0–37)
BILIRUBIN TOTAL: 0.5 mg/dL (ref 0.3–1.2)
BUN: 20 mg/dL (ref 6–23)
CO2: 25 mEq/L (ref 19–32)
Calcium: 9.5 mg/dL (ref 8.4–10.5)
Chloride: 107 mEq/L (ref 96–112)
Creatinine, Ser: 0.8 mg/dL (ref 0.4–1.2)
GFR: 82.71 mL/min (ref >60.00)
GLUCOSE: 99 mg/dL (ref 70–99)
POTASSIUM: 4.2 meq/L (ref 3.5–5.1)
Sodium: 140 mEq/L (ref 135–145)
Total Protein: 7.6 g/dL (ref 6.0–8.3)

## 2013-08-15 LAB — LIPID PANEL
Cholesterol: 167 mg/dL (ref 0–200)
HDL: 41.8 mg/dL (ref >39.00)
LDL Cholesterol: 113 mg/dL — ABNORMAL HIGH (ref 0–99)
Total CHOL/HDL Ratio: 4
Triglycerides: 63 mg/dL (ref 0.0–149.0)
VLDL: 12.6 mg/dL (ref 0.0–40.0)

## 2013-08-15 LAB — CBC
HEMATOCRIT: 41 % (ref 36.0–46.0)
Hemoglobin: 13.4 g/dL (ref 12.0–15.0)
MCHC: 32.6 g/dL (ref 30.0–36.0)
MCV: 87.6 fl (ref 78.0–100.0)
PLATELETS: 393 10*3/uL (ref 150.0–400.0)
RBC: 4.68 Mil/uL (ref 3.87–5.11)
RDW: 13.6 % (ref 11.5–14.6)
WBC: 7.1 10*3/uL (ref 4.5–10.5)

## 2013-08-15 LAB — TSH: TSH: 3.1 u[IU]/mL (ref 0.35–5.50)

## 2013-08-15 LAB — HEMOGLOBIN A1C: Hgb A1c MFr Bld: 5.1 % (ref 4.6–6.5)

## 2013-08-15 MED ORDER — TRAMADOL HCL 50 MG PO TABS: ORAL_TABLET | ORAL | Status: DC

## 2013-08-15 MED ORDER — ALPRAZOLAM 0.25 MG PO TABS: 0.2500 mg | ORAL_TABLET | Freq: Two times a day (BID) | ORAL | Status: DC | PRN

## 2013-08-15 MED ORDER — EPINEPHRINE 0.3 MG/0.3ML IJ SOAJ: 0.3000 mg | Freq: Once | INTRAMUSCULAR | Status: DC

## 2013-08-15 NOTE — Patient Instructions (Signed)

## 2013-08-15 NOTE — Assessment & Plan Note (Signed)
Discussed timed voiding  Encouraged her to wear a panty liner daily

## 2013-08-15 NOTE — Assessment & Plan Note (Signed)
On [employer] about 5 hours of sleep per night

## 2013-08-15 NOTE — Telephone Encounter (Signed)
Pt left v/m was seen earlier today and wanted to ck on status of refills of meds that were to be called in; pt wanted to know when meds were called in.pt request cb.

## 2013-08-15 NOTE — Assessment & Plan Note (Signed)
No improvement with probiotic Takes Mirilax prn Will occasionally require enema Advised her to increase her water intake

## 2013-08-15 NOTE — Addendum Note (Signed)
Addended by: Lurlean Nanny on: 08/15/2013 09:04 AM   Modules accepted: Orders

## 2013-08-15 NOTE — Assessment & Plan Note (Signed)
Advised her to work on her diet and exercise Will check A1C today

## 2013-08-15 NOTE — Assessment & Plan Note (Signed)
Will start low dose xanax 0.25 mg prn Advised her if she is having to take it > 3 x per week will need to start a daily medication such as celexa

## 2013-08-15 NOTE — Assessment & Plan Note (Signed)
Tramadol refilled today Pt sent to lab for UDS

## 2013-08-15 NOTE — Telephone Encounter (Signed)
Called in Rx for Xanax and Tramadol per [facility] verbal order

## 2013-08-15 NOTE — Progress Notes (Signed)
Subjective:    Patient ID: [name], female    DOB: 1980/09/06, 33 y.o.   MRN: [id number]  HPI  Pt presents to the clinic today for her annual exam. She does have some concerns about feeling stress, anxious and overwhelemed. This has been going for some time. It does seem to have gotten worse over the last year. She does report that it is work related. She is having issues with her boss. She is not in a position to change jobs at this time. She would like something to take as needed for anxiety. She denies any s/s of depression. Also, she needs a refill of her ultram and epipen.  Flu: 2012 Tetanus: 2004 LMP: hysterectomy Pelvic: 2013 Dentist: as needed  Review of Systems      Past Medical History  Diagnosis Date  . GLUCOSE INTOLERANCE 03/19/2009  . COMMON MIGRAINE 03/19/2009  . ALLERGIC RHINITIS 10/11/2009  . HYDRONEPHROSIS, RIGHT 05/11/2009  . RENAL CALCULUS, RIGHT 05/11/2009  . ANKLE PAIN, RIGHT 04/03/2009  . BACK PAIN 03/19/2009  . INSOMNIA-SLEEP DISORDER-UNSPEC 03/19/2009  . FLANK PAIN, RIGHT 05/11/2009  . NEPHROLITHIASIS, HX OF 03/19/2009  . Allergy to latex 04/11/2010    Current Outpatient Prescriptions  Medication Sig Dispense Refill  . EPINEPHrine (EPIPEN) 0.3 mg/0.3 mL DEVI Inject 0.3 mLs (0.3 mg total) into the muscle once. For severe allergic reaction  2 Device  1  . tiZANidine (ZANAFLEX) 4 MG tablet TAKE 1 TABLET EVERY 6 HOURS AS NEEDED  60 tablet  3  . traMADol (ULTRAM) 50 MG tablet TAKE 1 TABLET BY MOUTH EVERY 6 HOURS AS NEEDED FOR PAIN  120 tablet  0  . zolpidem (AMBIEN CR) 12.5 MG CR tablet TAKE 1 TABLET BY MOUTH AT BEDTIME AS NEEDED  30 tablet  5   No current facility-administered medications for this visit.    Allergies  Allergen Reactions  . Latex     Carries epipen  . Amoxicillin-Pot Clavulanate   . Aspirin   . Ibuprofen   . Nsaids   . Penicillins   . Red Dye     rash    Family History  Problem Relation Age of Onset  . Alcohol abuse Mother     ETOH  . Bipolar disorder Mother     manic depressive/bipolar  . Thyroid disease Mother     Low thyroid  . Hypertension Father   . Hypertension Other   . Diabetes Other   . Heart disease Other   . Stroke Other   . Cancer Other     Lung Cancer    History   Social History  . Marital Status: Married    Spouse Name: N/A    Number of Children: 0  . Years of Education: N/A   Occupational History  . customer service [employer] History Main Topics  . Smoking status: Former Smoker    Types: Cigarettes  . Smokeless tobacco: Never Used     Comment: quit with chantix 2010  . Alcohol Use: No     Comment: rare  . Drug Use: No  . Sexual Activity: Yes   Other Topics Concern  . Not on file   Social History Narrative   Married to [employer]   Enjoys reading   No kids     Constitutional: Denies fever, malaise, fatigue, headache or abrupt weight changes.  HEENT: Denies eye pain, eye redness, ear pain, ringing in the ears, wax buildup,  runny nose, nasal congestion, bloody nose, or sore throat. Respiratory: Denies difficulty breathing, shortness of breath, cough or sputum production.   Cardiovascular: Denies chest pain, chest tightness, palpitations or swelling in the hands or feet.  Gastrointestinal: Pt reports constipation. Denies abdominal pain, bloating, diarrhea or blood in the stool.  GU: Pt reports stress incontinence. Denies urgency, frequency, pain with urination, burning sensation, blood in urine, odor or discharge. Musculoskeletal: Pt reports sciatica. Denies decrease in range of motion, difficulty with gait, muscle pain or joint pain and swelling.  Skin: Pt reports dry skin. Denies redness, rashes, lesions or ulcercations.  Neurological: Pt reports insomnia. Denies dizziness, difficulty with memory, difficulty with speech or problems with balance and coordination.   No other specific complaints in a complete review of systems (except as  listed in HPI above).  Objective:   Physical Exam  BP 118/80  Pulse 81  Temp(Src) 98.6 F (37 C) (Oral)  Ht 5\' 7"  (1.702 m)  Wt 204 lb (92.534 kg)  BMI 31.94 kg/m2  SpO2 98% Wt Readings from Last 3 Encounters:  08/15/13 204 lb (92.534 kg)  06/15/13 209 lb 8 oz (95.029 kg)  03/15/13 204 lb 4 oz (92.647 kg)    General: Appears her stated age, obese but well developed, well nourished in NAD. Skin: Warm, dry and intact. No rashes, lesions or ulcerations noted. HEENT: Head: normal shape and size; Eyes: sclera white, no icterus, conjunctiva pink, PERRLA and EOMs intact; Ears: Tm's gray and intact, normal light reflex; Nose: mucosa pink and moist, septum midline; Throat/Mouth: Teeth present, mucosa pink and moist, no exudate, lesions or ulcerations noted.  Neck: Normal range of motion. Neck supple, trachea midline. No massses, lumps or thyromegaly present.  Cardiovascular: Normal rate and rhythm. S1,S2 noted.  No murmur, rubs or gallops noted. No JVD or BLE edema. No carotid bruits noted. Pulmonary/Chest: Normal effort and positive vesicular breath sounds. No respiratory distress. No wheezes, rales or ronchi noted.  Abdomen: Soft and nontender. Normal bowel sounds, no bruits noted. No distention or masses noted. Liver, spleen and kidneys non palpable. Musculoskeletal: Normal range of motion. No signs of joint swelling. No difficulty with gait.  Neurological: Alert and oriented. Cranial nerves II-XII intact. Coordination normal. +DTRs bilaterally. Psychiatric: Mood and affect normal. Behavior is normal. Judgment and thought content normal.    BMET    Component Value Date/Time   NA 141 03/23/2012 1311   K 3.7 03/23/2012 1311   CL 107 03/23/2012 1311   CO2 28 03/23/2012 1311   GLUCOSE 89 03/23/2012 1311   BUN 15 03/23/2012 1311   CREATININE 1.0 03/23/2012 1311   CALCIUM 9.3 03/23/2012 1311   GFRNONAA 78.51 05/11/2009 1550    Lipid Panel  No results found for this basename: chol, trig, hdl,  cholhdl, vldl, ldlcalc    CBC    Component Value Date/Time   WBC 9.3 12/31/2012 1032   RBC 4.35 12/31/2012 1032   HGB 12.6 12/31/2012 1032   HCT 37.0 12/31/2012 1032   PLT 460.0* 12/31/2012 1032   MCV 85.1 12/31/2012 1032   MCHC 34.0 12/31/2012 1032   RDW 14.0 12/31/2012 1032   LYMPHSABS 3.0 12/31/2012 1032   MONOABS 0.9 12/31/2012 1032   EOSABS 0.2 12/31/2012 1032   BASOSABS 0.0 12/31/2012 1032    Hgb A1C No results found for this basename: HGBA1C         Assessment & Plan:   Preventative Health Maintenance:  Pt declines flu vaccine today Will give Td  vaccine today Will obtain screening labs today Will repeat pelvic exam next year Encouraged her to visit a dentist on a routine basis  RTC in 1 year or sooner if needed

## 2013-08-15 NOTE — Progress Notes (Signed)
Pre-visit discussion using our clinic review tool. No additional management support is needed unless otherwise documented below in the visit note.  

## 2013-08-17 NOTE — Telephone Encounter (Signed)
Rx already called into pharmacy.  

## 2013-08-24 ENCOUNTER — Encounter: Payer: Self-pay | Admitting: Family Medicine

## 2013-08-24 ENCOUNTER — Ambulatory Visit (INDEPENDENT_AMBULATORY_CARE_PROVIDER_SITE_OTHER): Payer: 59 | Admitting: Family Medicine

## 2013-08-24 VITALS — BP 108/70 | HR 76 | Temp 98.1°F | Wt 205.5 lb

## 2013-08-24 DIAGNOSIS — T148XXA Other injury of unspecified body region, initial encounter: Secondary | ICD-10-CM

## 2013-08-24 DIAGNOSIS — W19XXXA Unspecified fall, initial encounter: Secondary | ICD-10-CM

## 2013-08-24 DIAGNOSIS — Y92009 Unspecified place in unspecified non-institutional (private) residence as the place of occurrence of the external cause: Secondary | ICD-10-CM

## 2013-08-24 MED ORDER — HYDROCODONE-ACETAMINOPHEN 5-325 MG PO TABS: 1.0000 | ORAL_TABLET | Freq: Four times a day (QID) | ORAL | Status: DC | PRN

## 2013-08-24 NOTE — Patient Instructions (Signed)
Use hydrocodone for pain now instead of tramadol.  Heat/streching should help. Take care.

## 2013-08-24 NOTE — Progress Notes (Signed)
Pre-visit discussion using our clinic review tool. No additional management support is needed unless otherwise documented below in the visit note.  [place] [address], tripped 08/22/13 PM. No LOC.  R side sore since then, 5-6/10 in spite of pain meds.  R upper and mid back ttp, R side of neck less sore now.  No bruising.  Using heating pad.   Meds, vitals, and allergies reviewed.   ROS: See HPI.  Otherwise, noncontributory.  nad ncat Tm wnl Nasal and OP exam wnl  Neck supple, no midline pain rrr ctab Abd soft, not ttp No midline back pain R sided paraspinal muscle pain in the neck and upper back R shoulder with normal ROM CN 2-12 wnl B, S/S/DTR wnl x4 No bruising

## 2013-08-24 NOTE — Assessment & Plan Note (Signed)
No need to image, should resolve. Stop tramadol, change to hydrocodone.  Sedation caution.  She hit a laminate floor and is likely sore but w/o sig pathology o/w. Fu prn.  She agrees.

## 2013-09-07 ENCOUNTER — Encounter: Payer: Self-pay | Admitting: Family Medicine

## 2013-09-12 ENCOUNTER — Other Ambulatory Visit: Payer: Self-pay | Admitting: Internal Medicine

## 2013-09-12 NOTE — Telephone Encounter (Signed)
Last filled 08/15/13--please advise 

## 2013-09-13 NOTE — Telephone Encounter (Signed)
Medication phoned to pharmacy.  

## 2013-10-10 ENCOUNTER — Other Ambulatory Visit: Payer: Self-pay | Admitting: Internal Medicine

## 2013-10-10 NOTE — Telephone Encounter (Signed)
Last filled 08/15/13--please advise

## 2013-10-10 NOTE — Telephone Encounter (Signed)
Please call in.  If frequently needed, then schedule OV.  Thanks.

## 2013-10-11 NOTE — Telephone Encounter (Signed)
Medication phoned to pharmacy. Left detailed message on voicemail.

## 2013-10-23 ENCOUNTER — Other Ambulatory Visit: Payer: Self-pay | Admitting: Family Medicine

## 2013-10-24 NOTE — Telephone Encounter (Signed)
Electronic refill request. Last filled:  60 tablets with 3RF on 05/30/2013.  Please advise.

## 2013-10-24 NOTE — Telephone Encounter (Signed)
Sent, thanks

## 2013-12-07 ENCOUNTER — Other Ambulatory Visit: Payer: Self-pay | Admitting: Family Medicine

## 2013-12-07 NOTE — Telephone Encounter (Signed)
rx called into pharmacy

## 2013-12-07 NOTE — Telephone Encounter (Signed)
Please call in.  Thanks.   

## 2013-12-13 ENCOUNTER — Other Ambulatory Visit: Payer: Self-pay | Admitting: Family Medicine

## 2013-12-13 NOTE — Telephone Encounter (Signed)
Please call in.  Thanks.   

## 2013-12-13 NOTE — Telephone Encounter (Signed)
Medication phoned to pharmacy.  

## 2013-12-13 NOTE — Telephone Encounter (Signed)
Electronic refill request. Last Filled:   120 tablet 2 RF on   09/12/2013.  Please advise.

## 2014-02-02 ENCOUNTER — Encounter: Payer: Self-pay | Admitting: Family Medicine

## 2014-02-02 ENCOUNTER — Ambulatory Visit (INDEPENDENT_AMBULATORY_CARE_PROVIDER_SITE_OTHER)
Admission: RE | Admit: 2014-02-02 | Discharge: 2014-02-02 | Disposition: A | Discharge status: Home / Self Care | Payer: 59 | Source: Ambulatory Visit | Attending: Family Medicine | Admitting: Family Medicine

## 2014-02-02 ENCOUNTER — Ambulatory Visit (INDEPENDENT_AMBULATORY_CARE_PROVIDER_SITE_OTHER): Payer: 59 | Admitting: Family Medicine

## 2014-02-02 VITALS — BP 122/90 | HR 102 | Temp 98.8°F | Wt 203.0 lb

## 2014-02-02 DIAGNOSIS — R300 Dysuria: Secondary | ICD-10-CM

## 2014-02-02 DIAGNOSIS — R109 Unspecified abdominal pain: Secondary | ICD-10-CM

## 2014-02-02 DIAGNOSIS — N200 Calculus of kidney: Secondary | ICD-10-CM

## 2014-02-02 LAB — POCT URINALYSIS DIPSTICK
Bilirubin, UA: NEGATIVE
GLUCOSE UA: NEGATIVE
Ketones, UA: NEGATIVE
Leukocytes, UA: NEGATIVE
NITRITE UA: NEGATIVE
Protein, UA: NEGATIVE
Spec Grav, UA: 1.01
UROBILINOGEN UA: NEGATIVE
pH, UA: 6

## 2014-02-02 MED ORDER — TAMSULOSIN HCL 0.4 MG PO CAPS: 0.4000 mg | ORAL_CAPSULE | Freq: Every day | ORAL | Status: DC

## 2014-02-02 MED ORDER — OXYCODONE HCL 5 MG PO TABS: 5.0000 mg | ORAL_TABLET | Freq: Four times a day (QID) | ORAL | Status: DC | PRN

## 2014-02-02 MED ORDER — CIPROFLOXACIN HCL 250 MG PO TABS: 250.0000 mg | ORAL_TABLET | Freq: Two times a day (BID) | ORAL | Status: DC

## 2014-02-02 NOTE — Progress Notes (Signed)
Pre visit review using our clinic review tool, if applicable. No additional management support is needed unless otherwise documented below in the visit note.  She thought she had urinary sx for a few weeks, some lower abd pain and urine odor changes. More pain in the last few days.  Felt hot and had chills.  Some back pain now. Not burning with urination.  No discharge. Some nausea, no vomiting. L sided back pain, no R sided sx. The pain has moved some over the last few days.    S/p hysterectomy.  H/o mult stones with lithotripsy prev.  H/o stones that didn't show on KUB per patient report.   Meds, vitals, and allergies reviewed.   ROS: See HPI.  Otherwise, noncontributory.  nad ncat Mmm Neck supple, no LA rrr ctab Abd soft, slightly ttp in suprapubic region L flank ttp w/o rash Ext w/o edema  KUB reviewed with patient, no stone seen.

## 2014-02-02 NOTE — Patient Instructions (Signed)
Drink plenty of fluids.  Start the cipro and flomax today.  Oxycodone for pain.  Update [place] as needed.  Take care.

## 2014-02-03 NOTE — Assessment & Plan Note (Signed)
Presumed, would like to avoid CT due to higher radiation exposure, if possible.  She agrees.  Start cipro, check ucx, use oxycodone and flomax.  If not improved, passed, then notify us.  She agrees.  >25 minutes spent in face to face time with patient, >50% spent in counselling or coordination of care.

## 2014-02-04 LAB — URINE CULTURE

## 2014-02-06 ENCOUNTER — Encounter: Payer: Self-pay | Admitting: *Deleted

## 2014-02-06 ENCOUNTER — Telehealth: Payer: Self-pay | Admitting: Family Medicine

## 2014-02-06 NOTE — Telephone Encounter (Signed)
Called patient back. See result note

## 2014-02-06 NOTE — Telephone Encounter (Signed)
Pt called requesting culture results. Pt states she still has some pain but not as bad as it was.

## 2014-03-03 ENCOUNTER — Other Ambulatory Visit: Payer: Self-pay | Admitting: Family Medicine

## 2014-03-06 NOTE — Telephone Encounter (Signed)
plz phone in. Refilled alprazolam requested #20 and tramadol - will route to PCP as fyi.

## 2014-03-06 NOTE — Telephone Encounter (Signed)
Noted, thanks!

## 2014-03-06 NOTE — Telephone Encounter (Signed)
Rx's called in as directed.  

## 2014-04-21 ENCOUNTER — Other Ambulatory Visit: Payer: Self-pay

## 2014-04-28 ENCOUNTER — Other Ambulatory Visit: Payer: Self-pay | Admitting: Family Medicine

## 2014-04-28 NOTE — Telephone Encounter (Signed)
Ok to refill 

## 2014-04-30 NOTE — Telephone Encounter (Signed)
Sent. Thanks.   

## 2014-05-30 ENCOUNTER — Other Ambulatory Visit: Payer: Self-pay | Admitting: Family Medicine

## 2014-05-30 NOTE — Telephone Encounter (Signed)
Electronic refill request. Last Filled:    30 tablet 5 RF on 12/07/2013  Please advise.

## 2014-05-30 NOTE — Telephone Encounter (Signed)
plz phone in. 

## 2014-05-30 NOTE — Telephone Encounter (Signed)
Ok to refill in Dr. Buckner Malta absence? Last filled 03/06/14 #120 with 2RF.

## 2014-05-31 MED ORDER — ZOLPIDEM TARTRATE ER 12.5 MG PO TBCR: 12.5000 mg | EXTENDED_RELEASE_TABLET | Freq: Every day | ORAL | Status: DC

## 2014-05-31 NOTE — Telephone Encounter (Signed)
Duplicate refill request for ambien rx called into pharmacy

## 2014-05-31 NOTE — Telephone Encounter (Signed)
rx called into pharmacy for zolpidem

## 2014-06-05 ENCOUNTER — Other Ambulatory Visit: Payer: Self-pay | Admitting: Family Medicine

## 2014-06-05 NOTE — Telephone Encounter (Signed)
Pt called and [facility] not [facility] tram[facility]ol refill; spoke with Jinny Blossom at [facility] and their records do not show call in for tramadol on 05/31/14. Medication phoned to [facility] as instructed.pt notified done.

## 2014-06-16 IMAGING — CR DG ABDOMEN 1V
1 series · 1 of 1 positions shown · non-contrast
Comparison: CT abdomen pelvis -05/11/2004;

CLINICAL DATA: Evaluate for renal stones

ABDOMEN - 1 VIEW

[view not recorded]
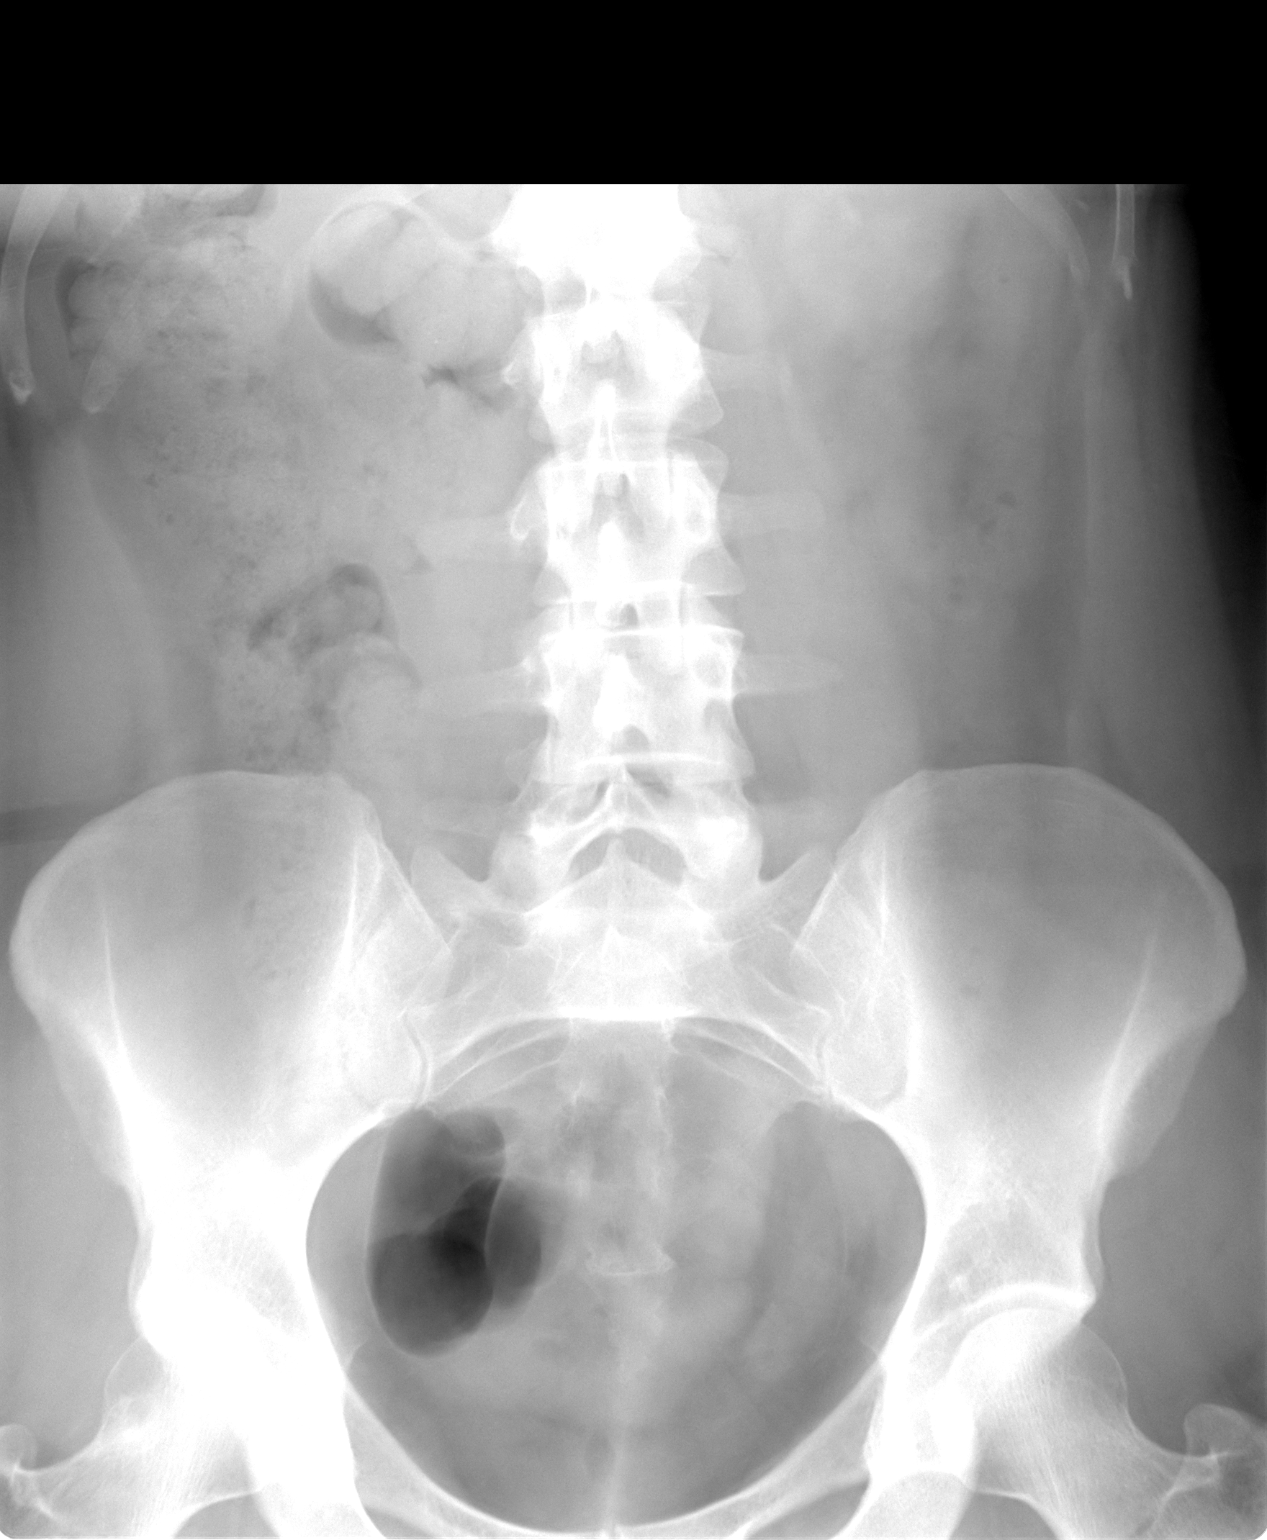

[1 of 1 positions shown; findings below may reference images not displayed]

FINDINGS: There is an approximately 3 mm opacity overlying the expected
location of the superior aspect of the left kidney.

There is a less well-defined approximately 9 mm opacity overlying
the expected location of the inferior aspect of the left kidney.

No definite opacities are seen overlying the expected location of
the right kidney, either ureter or the urinary bladder.

There is an overall paucity of bowel gas without definite evidence
of obstruction.

No acute osseous abnormalities.
IMPRESSION: Possible left sided nephrolithiasis.

## 2014-06-20 ENCOUNTER — Ambulatory Visit (INDEPENDENT_AMBULATORY_CARE_PROVIDER_SITE_OTHER): Payer: 59 | Admitting: Family Medicine

## 2014-06-20 ENCOUNTER — Encounter: Payer: Self-pay | Admitting: Family Medicine

## 2014-06-20 ENCOUNTER — Ambulatory Visit: Payer: 59 | Admitting: Family Medicine

## 2014-06-20 VITALS — BP 124/84 | HR 108 | Temp 99.1°F | Wt 217.8 lb

## 2014-06-20 DIAGNOSIS — R41840 Attention and concentration deficit: Secondary | ICD-10-CM

## 2014-06-20 DIAGNOSIS — Z23 Encounter for immunization: Secondary | ICD-10-CM

## 2014-06-20 DIAGNOSIS — F988 Other specified behavioral and emotional disorders with onset usually occurring in childhood and adolescence: Secondary | ICD-10-CM | POA: Insufficient documentation

## 2014-06-20 IMAGING — CT CT ABD-PELV W/O CM
1 of 2 series · 16 of 32 positions shown, 20 images · non-contrast
Comparison: none

REASON FOR EXAM: (1) left flank pain; kedney stone; (2) same
COMMENTS:

PROCEDURE:     CT  - CT ABDOMEN AND PELVIS W[DATE]  [DATE]
RESULT:     Comparison: 04/17/2011
TECHNIQUE: Multiple axial images from the lung bases to the symphysis pubis
were obtained without oral and without intravenous contrast.

[Series 2: 3mm soft tissue · axial · 0.71mm/px · z∈[-1154,-686]mm · 16 of 172 slices shown, 20 images]
[im 8/172  soft-tissue]
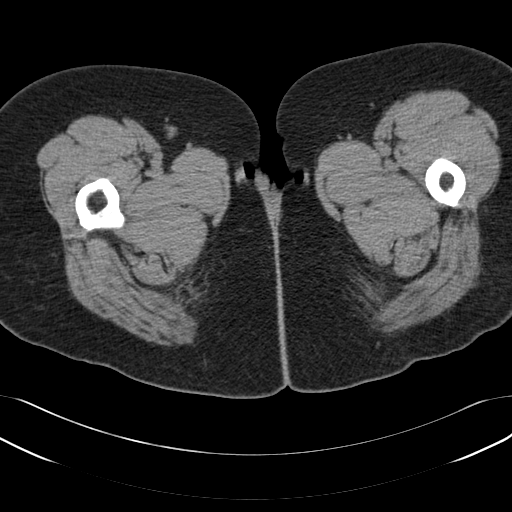
[im 8/172  bone]
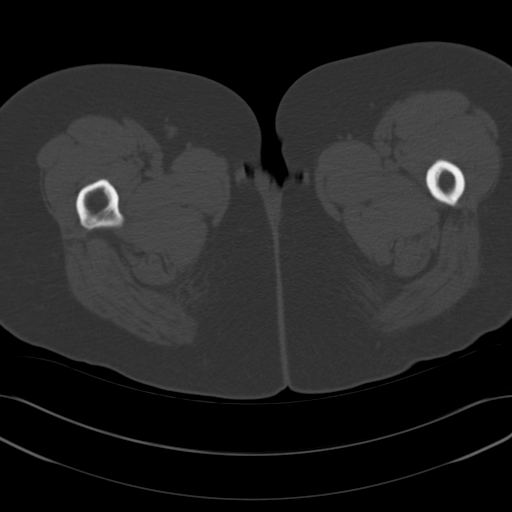
[im 22/172  soft-tissue]
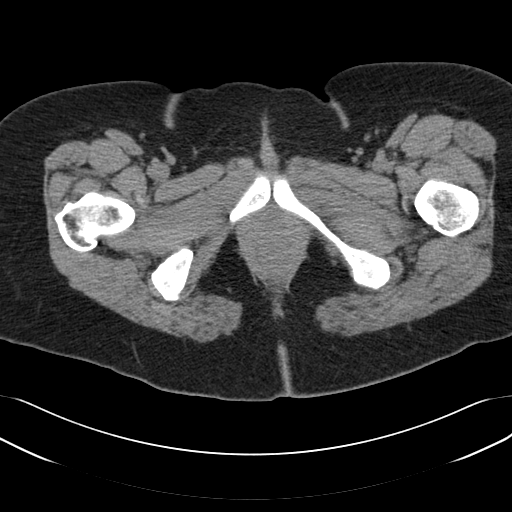
[im 36/172  soft-tissue]
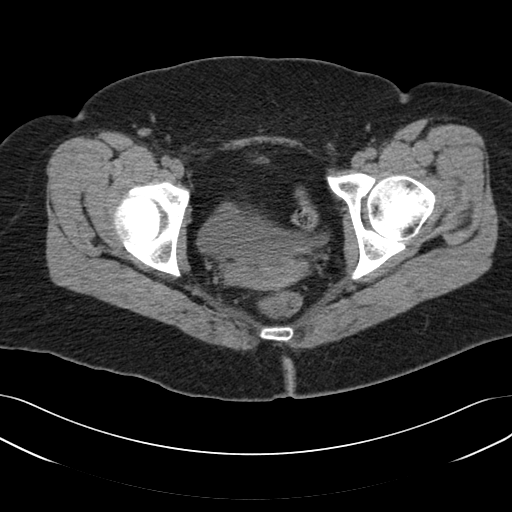
[im 43/172  soft-tissue]
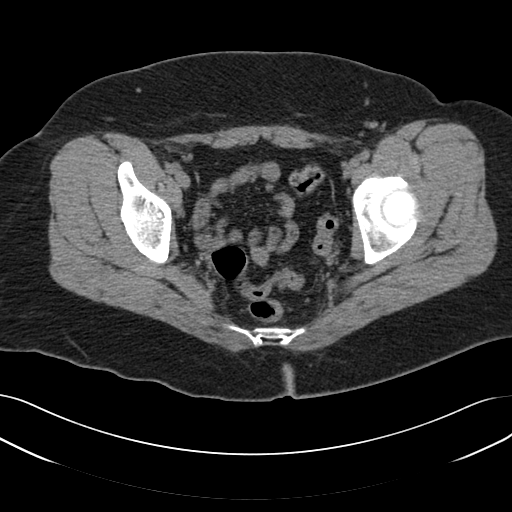
[im 58/172  soft-tissue]
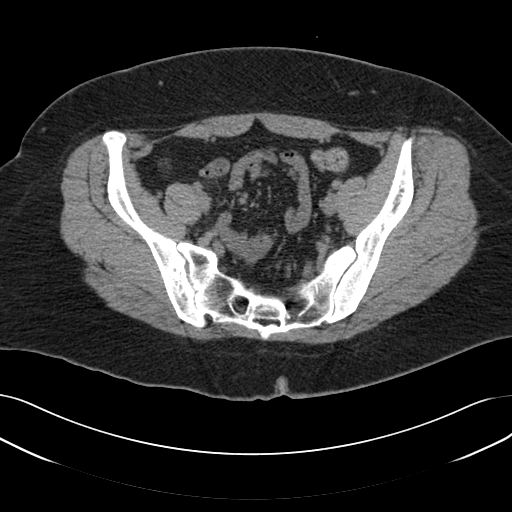
[im 72/172  soft-tissue]
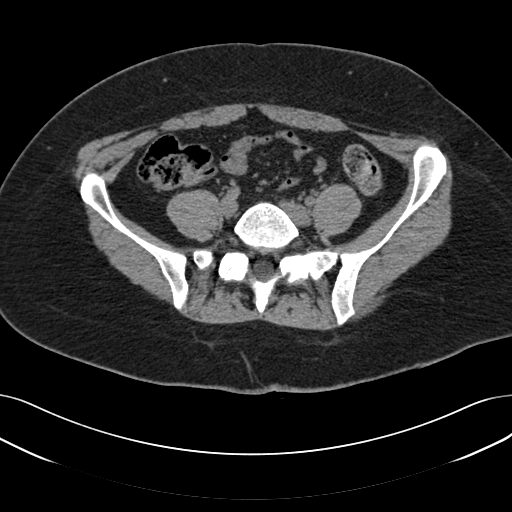
[im 79/172  soft-tissue]
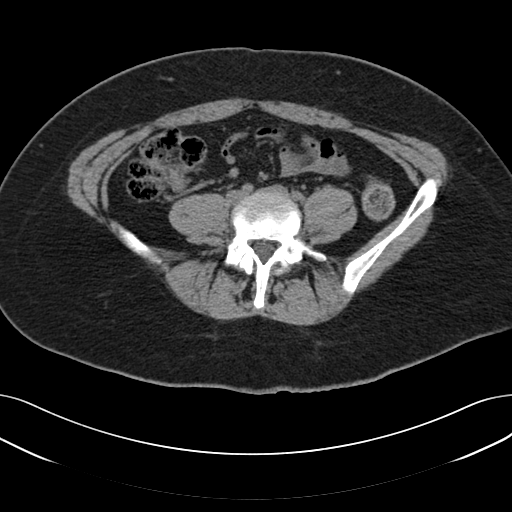
[im 93/172  soft-tissue]
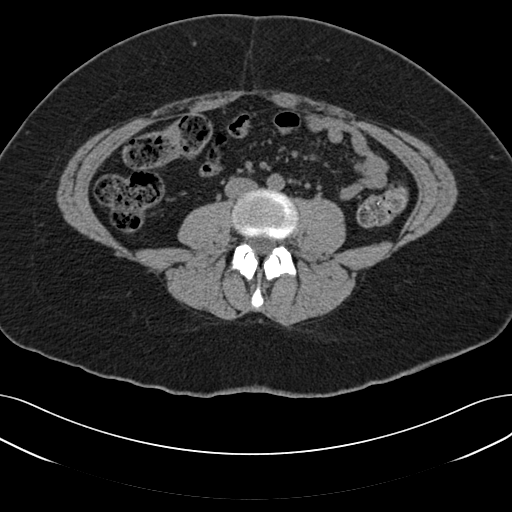
[im 100/172  soft-tissue]
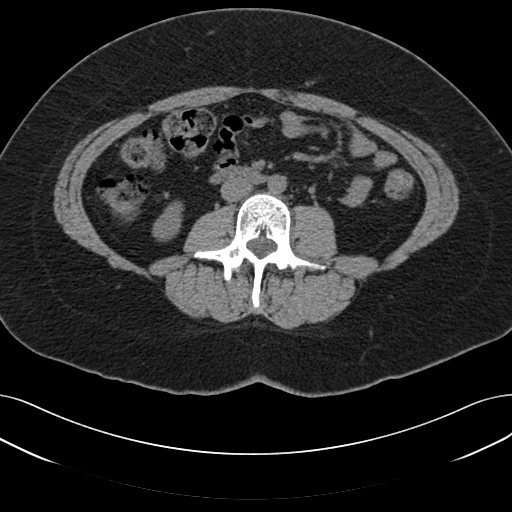
[im 100/172  bone]
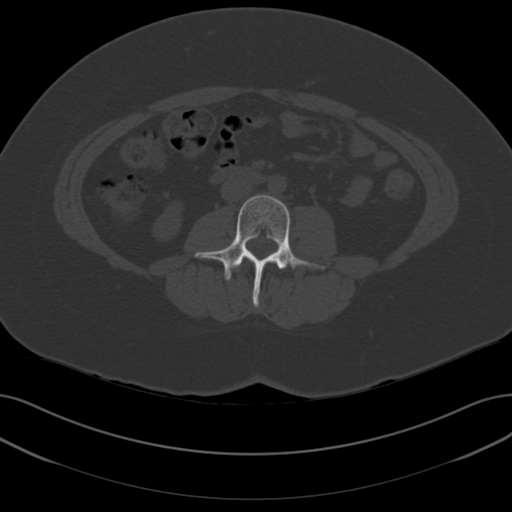
[im 115/172  soft-tissue]
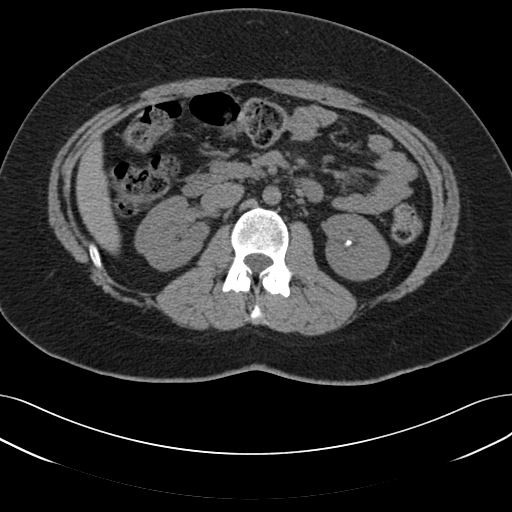
[im 129/172  soft-tissue]
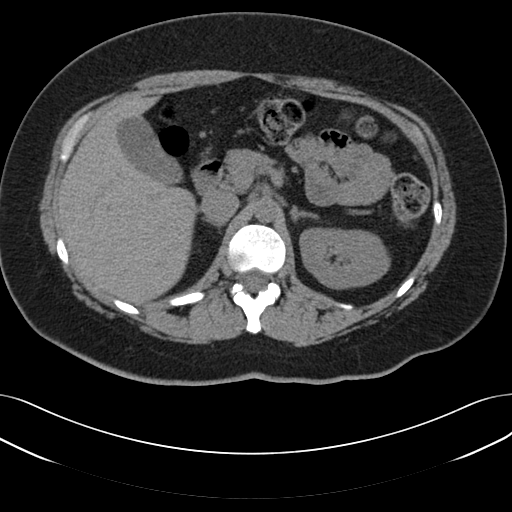
[im 136/172  soft-tissue]
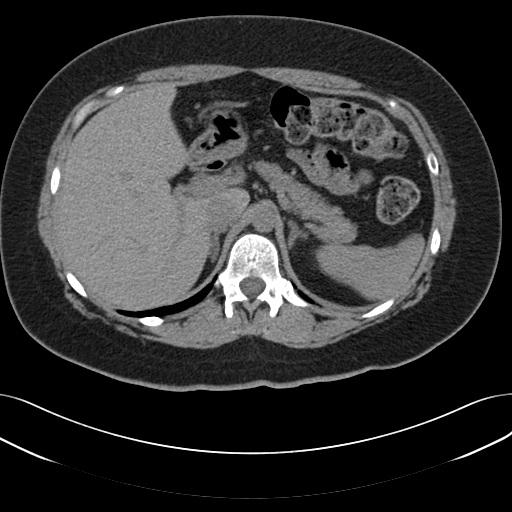
[im 143/172  lung]
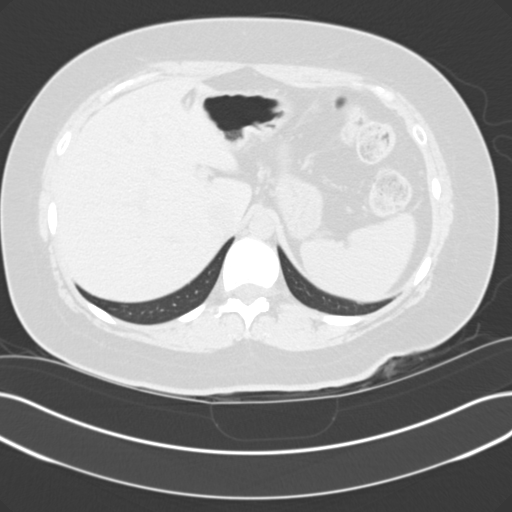
[im 150/172  soft-tissue]
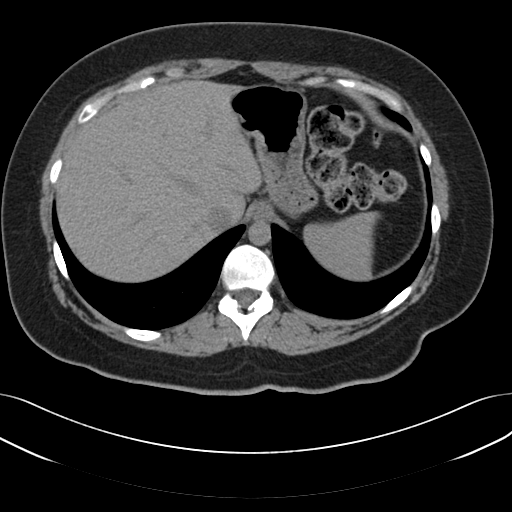
[im 150/172  lung]
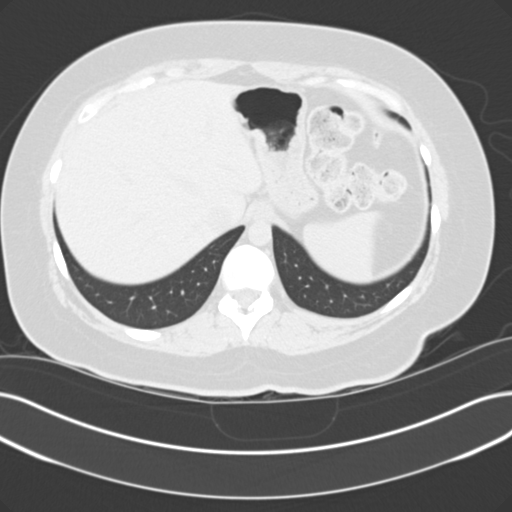
[im 157/172  lung]
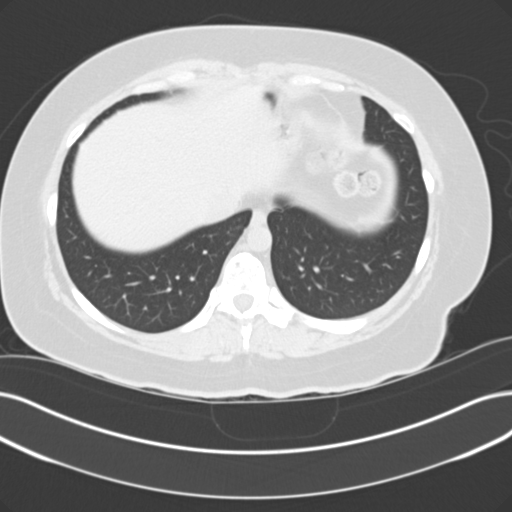
[im 164/172  soft-tissue]
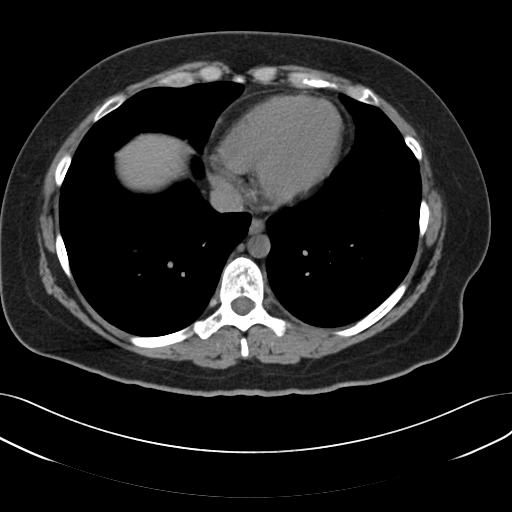
[im 164/172  lung]
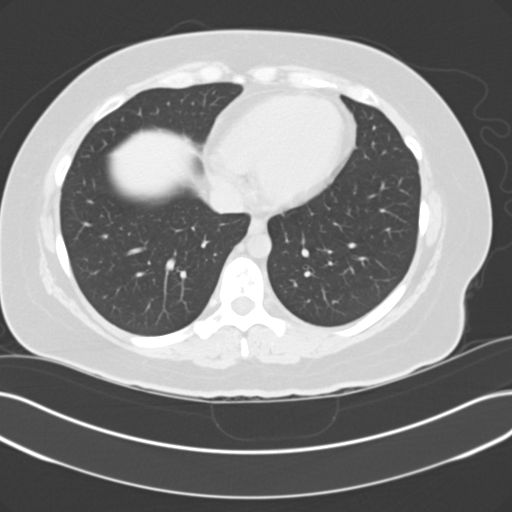

[16 of 32 positions shown; findings below may reference images not displayed]

FINDINGS: Lack of intravenous contrast limits evaluation of the solid abdominal
organs.  Grossly, the liver, gallbladder, spleen, adrenals, and pancreas are
unremarkable.

There are several calculi in the left kidney. The largest measures 6 mm. No
hydronephrosis. No ureterectasis. The patient is status post hysterectomy.
The small and large bowel are normal in caliber. The appendix is not
visualized. However, there are no inflammatory changes in the right lower
quadrant.

No aggressive lytic or sclerotic osseous lesions are identified.
IMPRESSION: Left-sided nephrolithiasis, without evidence of obstruction.

## 2014-06-20 NOTE — Patient Instructions (Signed)
[facility] will call about your referral. Take care.  Glad to see you.  

## 2014-06-20 NOTE — Progress Notes (Signed)
Pre visit review using our clinic review tool, if applicable. No additional management support is needed unless otherwise documented below in the visit note.  She had more trouble concentrating recently.  She describes lifelong hyperactivity.   She has more trouble with focusing on her tasks at work.  She is distracted by other thoughts at work.  She liked to read but has had more trouble focusing on that recently.  "I'll read the page and then I won't remember what I read."  That is atypical for her.   No speeding tickets recently, 1 a year ago.  2 lifetime.  She intentionally uses cruise control to compensate.   She had trouble for talking in school, for getting bored.  "It's always been there, but worse recently."   Mood is still good.  Home situation is still good.  She doesn't feel depressed.  FH noted for mother and sister with BAD.  "My younger sister is high strung."  Her back pain is at baseline, usually does worse in cold weather, but manageable.   She has tried to cut back on caffeine, it was a sig amount, now 1 cup of coffee a day.   Her concentration may be worse but prev HA are better on less caffeine.   She has noted a higher work load with her job.  "It's a rush to get things done" with task switching.    Meds, vitals, and allergies reviewed.   ROS: See HPI.  Otherwise, noncontributory.  GEN: nad, alert and oriented HEENT: mucous membranes moist NECK: supple w/o LA CV: rrr.  PULM: ctab, no inc wob EXT: no edema CN 2-12 wnl B, S/S/DTR wnl x4 No tremor.

## 2014-06-20 NOTE — Assessment & Plan Note (Signed)
Possible ADD, d/w pt. Refer for testing at psych and f/u here after that, depending on the report.  She agrees.  Mood is still good, okay for outpatient fu.

## 2014-06-26 ENCOUNTER — Other Ambulatory Visit: Payer: Self-pay | Admitting: Family Medicine

## 2014-06-26 NOTE — Telephone Encounter (Signed)
Electronic refill request. Last Filled:    30 tablet 0 RF on 05/31/2014  Please advise.

## 2014-06-27 NOTE — Telephone Encounter (Signed)
Medication phoned to pharmacy.  

## 2014-06-27 NOTE — Telephone Encounter (Signed)
Please call in.  Thanks.   

## 2014-07-12 ENCOUNTER — Ambulatory Visit (INDEPENDENT_AMBULATORY_CARE_PROVIDER_SITE_OTHER): Payer: 59 | Admitting: Psychology

## 2014-07-12 DIAGNOSIS — F908 Attention-deficit hyperactivity disorder, other type: Secondary | ICD-10-CM

## 2014-07-19 ENCOUNTER — Ambulatory Visit (INDEPENDENT_AMBULATORY_CARE_PROVIDER_SITE_OTHER): Payer: 59 | Admitting: Psychology

## 2014-07-19 DIAGNOSIS — F908 Attention-deficit hyperactivity disorder, other type: Secondary | ICD-10-CM

## 2014-07-29 ENCOUNTER — Other Ambulatory Visit: Payer: Self-pay | Admitting: Family Medicine

## 2014-07-31 ENCOUNTER — Telehealth: Payer: Self-pay | Admitting: *Deleted

## 2014-07-31 NOTE — Telephone Encounter (Signed)
Medication phoned to pharmacy.  

## 2014-07-31 NOTE — Telephone Encounter (Signed)
Please call in.  Thanks.   

## 2014-07-31 NOTE — Telephone Encounter (Signed)
Electronic refill request. Last Filled:    30 tablet 1 RF on  06/27/2014  Please advise.

## 2014-07-31 NOTE — Telephone Encounter (Signed)
Pt called and lmom wanting to know if pharmacy had contacted our office for refills, and if we had received records re: psych testing.

## 2014-08-01 NOTE — Telephone Encounter (Signed)
Please call in.  Thanks.   

## 2014-08-01 NOTE — Telephone Encounter (Signed)
Medication phoned to pharmacy.  

## 2014-08-08 ENCOUNTER — Telehealth: Payer: Self-pay | Admitting: Family Medicine

## 2014-08-08 DIAGNOSIS — F988 Other specified behavioral and emotional disorders with onset usually occurring in childhood and adolescence: Secondary | ICD-10-CM

## 2014-08-08 NOTE — Telephone Encounter (Signed)
Operator at office asked me to leave a message with the medical records dept.  Did so and gave our phone number and the fax number at [facility] desk.

## 2014-08-08 NOTE — Telephone Encounter (Addendum)
I don't recall seeing this come in yet and I don't see it in the EMR.  Please call and ask for Dr. Daron Offer input in the meantime.  Thanks.

## 2014-08-08 NOTE — Telephone Encounter (Signed)
Patient called to find out if you have received the report from her ADHD testing by Dr.Altabet.

## 2014-08-11 NOTE — Telephone Encounter (Signed)
[facility] called back saying they see no report yet.  She will leave a message to Dr. Lurline Hare to see if it is still being worked on.  [facility] says they usually do not send a report to PCP unless requested with signed records release.

## 2014-08-13 ENCOUNTER — Other Ambulatory Visit: Payer: Self-pay | Admitting: Family Medicine

## 2014-08-13 NOTE — Telephone Encounter (Signed)
Call pt.  Report came in.  Likely ADD by testing.  See if she can come in so we can talk about options.  Thanks.

## 2014-08-14 NOTE — Telephone Encounter (Signed)
Patient advised. Appointment scheduled.  

## 2014-08-14 NOTE — Telephone Encounter (Signed)
Electronic refill request. Last Filled:    60 tablet 3 RF on 04/30/2014  Please advise.

## 2014-08-15 ENCOUNTER — Ambulatory Visit (INDEPENDENT_AMBULATORY_CARE_PROVIDER_SITE_OTHER): Payer: 59 | Admitting: Family Medicine

## 2014-08-15 ENCOUNTER — Encounter: Payer: Self-pay | Admitting: Family Medicine

## 2014-08-15 VITALS — BP 102/76 | HR 92 | Temp 98.5°F | Wt 218.2 lb

## 2014-08-15 DIAGNOSIS — F909 Attention-deficit hyperactivity disorder, unspecified type: Secondary | ICD-10-CM

## 2014-08-15 DIAGNOSIS — F988 Other specified behavioral and emotional disorders with onset usually occurring in childhood and adolescence: Secondary | ICD-10-CM

## 2014-08-15 MED ORDER — METHYLPHENIDATE HCL 5 MG PO TABS
ORAL_TABLET | ORAL | Status: DC
Start: 2014-08-15 — End: 2014-09-13

## 2014-08-15 NOTE — Telephone Encounter (Signed)
Sent, thanks

## 2014-08-15 NOTE — Progress Notes (Signed)
Pre visit review using our clinic review tool, if applicable. No additional management support is needed unless otherwise documented below in the visit note.  ADD.  Recently testing done, positive.  D/w pt about lifestyle changes.  Recently started exercising.  She is working on sleep.  Uses list, etc.  Wanted to discuss tx options.  Testing results reviewed with patient.    Meds, vitals, and allergies reviewed.   ROS: See HPI.  Otherwise, noncontributory.  nad ncat Mmm Speech wnl Affect wnl rrr

## 2014-08-15 NOTE — Patient Instructions (Signed)
Try 1 ritalin in AM, then add on 2nd tab midday after a few days.  Update me in about 1-2 weeks, sooner if needed.  Take care.  Glad to see you.

## 2014-08-15 NOTE — Telephone Encounter (Signed)
Per pt's medical record meds were sent in to pharmacy, as requested by pharmacy. As of today, records from psych have been received, and pt has a f/u with Dr to discuss results.

## 2014-08-16 ENCOUNTER — Encounter: Payer: Self-pay | Admitting: Family Medicine

## 2014-08-16 NOTE — Assessment & Plan Note (Signed)
Reasonable to start with short acting med, ritalin, with low dose once a day.  May need second dose in midday.  Will avoid long acting meds for now.  D/w pt about routine cautions and SE of stimulants.  She agrees that risk< benefit.   Discussed with patient re: controlled meds.  Patient is not to abuse, misuse, divert, or use the medicine in any way other than as prescribed.  Patient is not to use illegal drugs.  Failure to comply can prevent further prescribing of controlled meds from this clinic.   She had prev ok UDS.  She'll start med and then update me.  She agrees.

## 2014-09-12 ENCOUNTER — Encounter: Payer: Self-pay | Admitting: Family Medicine

## 2014-09-13 ENCOUNTER — Other Ambulatory Visit: Payer: Self-pay | Admitting: Family Medicine

## 2014-09-13 MED ORDER — METHYLPHENIDATE HCL 5 MG PO TABS
5.0000 mg | ORAL_TABLET | Freq: Two times a day (BID) | ORAL | Status: DC
Start: 2014-09-13 — End: 2014-09-13

## 2014-09-13 MED ORDER — METHYLPHENIDATE HCL 5 MG PO TABS: 5.0000 mg | ORAL_TABLET | Freq: Two times a day (BID) | ORAL | Status: DC

## 2014-09-24 ENCOUNTER — Emergency Department (HOSPITAL_COMMUNITY): Payer: 59

## 2014-09-24 ENCOUNTER — Inpatient Hospital Stay (HOSPITAL_COMMUNITY): Payer: 59 | Admitting: Anesthesiology

## 2014-09-24 ENCOUNTER — Inpatient Hospital Stay (HOSPITAL_COMMUNITY)
Admission: EM | Admit: 2014-09-24 | Discharge: 2014-09-27 | DRG: 694 | Disposition: A | Discharge status: Home / Self Care | Payer: 59 | Attending: Urology | Admitting: Urology

## 2014-09-24 ENCOUNTER — Encounter (HOSPITAL_COMMUNITY): Payer: Self-pay | Admitting: *Deleted

## 2014-09-24 ENCOUNTER — Encounter (HOSPITAL_COMMUNITY)
Admission: EM | Disposition: A | Discharge status: Home / Self Care | Payer: Self-pay | Source: Home / Self Care | Attending: Urology

## 2014-09-24 DIAGNOSIS — Z91040 Latex allergy status: Secondary | ICD-10-CM

## 2014-09-24 DIAGNOSIS — N201 Calculus of ureter: Secondary | ICD-10-CM | POA: Diagnosis present

## 2014-09-24 DIAGNOSIS — Z87440 Personal history of urinary (tract) infections: Secondary | ICD-10-CM

## 2014-09-24 DIAGNOSIS — R1032 Left lower quadrant pain: Secondary | ICD-10-CM | POA: Diagnosis present

## 2014-09-24 DIAGNOSIS — N390 Urinary tract infection, site not specified: Secondary | ICD-10-CM | POA: Diagnosis present

## 2014-09-24 DIAGNOSIS — Z811 Family history of alcohol abuse and dependence: Secondary | ICD-10-CM

## 2014-09-24 DIAGNOSIS — Z87891 Personal history of nicotine dependence: Secondary | ICD-10-CM

## 2014-09-24 DIAGNOSIS — Z823 Family history of stroke: Secondary | ICD-10-CM | POA: Diagnosis not present

## 2014-09-24 DIAGNOSIS — Z8249 Family history of ischemic heart disease and other diseases of the circulatory system: Secondary | ICD-10-CM

## 2014-09-24 DIAGNOSIS — N132 Hydronephrosis with renal and ureteral calculous obstruction: Secondary | ICD-10-CM | POA: Diagnosis present

## 2014-09-24 DIAGNOSIS — Z833 Family history of diabetes mellitus: Secondary | ICD-10-CM | POA: Diagnosis not present

## 2014-09-24 DIAGNOSIS — Z818 Family history of other mental and behavioral disorders: Secondary | ICD-10-CM

## 2014-09-24 DIAGNOSIS — Z801 Family history of malignant neoplasm of trachea, bronchus and lung: Secondary | ICD-10-CM

## 2014-09-24 DIAGNOSIS — N3001 Acute cystitis with hematuria: Secondary | ICD-10-CM

## 2014-09-24 DIAGNOSIS — N159 Renal tubulo-interstitial disease, unspecified: Secondary | ICD-10-CM | POA: Diagnosis present

## 2014-09-24 HISTORY — PX: CYSTOSCOPY W/ URETERAL STENT PLACEMENT: SHX1429

## 2014-09-24 LAB — I-STAT CG4 LACTIC ACID, ED
LACTIC ACID, VENOUS: 0.96 mmol/L (ref 0.5–2.0)
Lactic Acid, Venous: 0.64 mmol/L (ref 0.5–2.0)

## 2014-09-24 LAB — COMPREHENSIVE METABOLIC PANEL
ALT: 24 U/L (ref 0–35)
AST: 29 U/L (ref 0–37)
Albumin: 4.3 g/dL (ref 3.5–5.2)
Alkaline Phosphatase: 96 U/L (ref 39–117)
Anion gap: 9 (ref 5–15)
BUN: 26 mg/dL — ABNORMAL HIGH (ref 6–23)
CO2: 24 mmol/L (ref 19–32)
Calcium: 9.3 mg/dL (ref 8.4–10.5)
Chloride: 107 mmol/L (ref 96–112)
Creatinine, Ser: 1.38 mg/dL — ABNORMAL HIGH (ref 0.50–1.10)
GFR calc Af Amer: 57 mL/min — ABNORMAL LOW (ref >90)
GFR calc non Af Amer: 49 mL/min — ABNORMAL LOW (ref >90)
Glucose, Bld: 99 mg/dL (ref 70–99)
Potassium: 3.8 mmol/L (ref 3.5–5.1)
Sodium: 140 mmol/L (ref 135–145)
Total Bilirubin: 0.9 mg/dL (ref 0.3–1.2)
Total Protein: 7.2 g/dL (ref 6.0–8.3)

## 2014-09-24 LAB — CBC WITH DIFFERENTIAL/PLATELET
Basophils Absolute: 0 10*3/uL (ref 0.0–0.1)
Basophils Relative: 0 % (ref 0–1)
Eosinophils Absolute: 0 10*3/uL (ref 0.0–0.7)
Eosinophils Relative: 0 % (ref 0–5)
HCT: 43 % (ref 36.0–46.0)
Hemoglobin: 14.4 g/dL (ref 12.0–15.0)
Lymphocytes Relative: 5 % — ABNORMAL LOW (ref 12–46)
Lymphs Abs: 0.8 10*3/uL (ref 0.7–4.0)
MCH: 28.9 pg (ref 26.0–34.0)
MCHC: 33.5 g/dL (ref 30.0–36.0)
MCV: 86.2 fL (ref 78.0–100.0)
Monocytes Absolute: 0.9 10*3/uL (ref 0.1–1.0)
Monocytes Relative: 6 % (ref 3–12)
Neutro Abs: 13.5 10*3/uL — ABNORMAL HIGH (ref 1.7–7.7)
Neutrophils Relative %: 89 % — ABNORMAL HIGH (ref 43–77)
Platelets: 267 10*3/uL (ref 150–400)
RBC: 4.99 MIL/uL (ref 3.87–5.11)
RDW: 13.4 % (ref 11.5–15.5)
WBC: 15.2 10*3/uL — ABNORMAL HIGH (ref 4.0–10.5)

## 2014-09-24 LAB — URINALYSIS, ROUTINE W REFLEX MICROSCOPIC
Bilirubin Urine: NEGATIVE
Glucose, UA: NEGATIVE mg/dL
Ketones, ur: 40 mg/dL — AB
Nitrite: POSITIVE — AB
Protein, ur: NEGATIVE mg/dL
Specific Gravity, Urine: 1.021 (ref 1.005–1.030)
Urobilinogen, UA: 0.2 mg/dL (ref 0.0–1.0)
pH: 5.5 (ref 5.0–8.0)

## 2014-09-24 LAB — URINE MICROSCOPIC-ADD ON

## 2014-09-24 LAB — SURGICAL PCR SCREEN
MRSA, PCR: NEGATIVE
Staphylococcus aureus: NEGATIVE

## 2014-09-24 SURGERY — CYSTOSCOPY, WITH RETROGRADE PYELOGRAM AND URETERAL STENT INSERTION
Anesthesia: General | Site: Ureter | Laterality: Left

## 2014-09-24 MED ORDER — OXYCODONE-ACETAMINOPHEN 5-325 MG PO TABS
1.0000 | ORAL_TABLET | Freq: Four times a day (QID) | ORAL | Status: DC | PRN
Start: 2014-09-24 — End: 2014-09-25
  Administered 2014-09-24 – 2014-09-25 (×4): 2 via ORAL
  Filled 2014-09-24 (×4): qty 2

## 2014-09-24 MED ORDER — MIDAZOLAM HCL 2 MG/2ML IJ SOLN
INTRAMUSCULAR | Status: AC
  Filled 2014-09-24: qty 2

## 2014-09-24 MED ORDER — ACETAMINOPHEN 325 MG PO TABS
650.0000 mg | ORAL_TABLET | Freq: Once | ORAL | Status: AC
  Administered 2014-09-24: 650 mg via ORAL
  Filled 2014-09-24: qty 2

## 2014-09-24 MED ORDER — SODIUM CHLORIDE 0.9 % IV SOLN: 1000.0000 mL | INTRAVENOUS | Status: DC

## 2014-09-24 MED ORDER — SODIUM CHLORIDE 0.9 % IR SOLN
Status: DC | PRN
  Administered 2014-09-24: 3000 mL

## 2014-09-24 MED ORDER — HYDROMORPHONE HCL 1 MG/ML IJ SOLN
1.0000 mg | Freq: Once | INTRAMUSCULAR | Status: AC
  Administered 2014-09-24: 1 mg via INTRAVENOUS

## 2014-09-24 MED ORDER — FENTANYL CITRATE 0.05 MG/ML IJ SOLN
INTRAMUSCULAR | Status: AC
  Filled 2014-09-24: qty 2

## 2014-09-24 MED ORDER — LIDOCAINE HCL (CARDIAC) 20 MG/ML IV SOLN
INTRAVENOUS | Status: DC | PRN
  Administered 2014-09-24: 100 mg via INTRAVENOUS

## 2014-09-24 MED ORDER — ONDANSETRON HCL 4 MG/2ML IJ SOLN
4.0000 mg | INTRAMUSCULAR | Status: DC | PRN
  Administered 2014-09-24 – 2014-09-26 (×3): 4 mg via INTRAVENOUS
  Filled 2014-09-24 (×4): qty 2

## 2014-09-24 MED ORDER — MORPHINE SULFATE 4 MG/ML IJ SOLN
6.0000 mg | Freq: Once | INTRAMUSCULAR | Status: DC
Start: 2014-09-24 — End: 2014-09-24

## 2014-09-24 MED ORDER — METHYLPHENIDATE HCL 5 MG PO TABS: 5.0000 mg | ORAL_TABLET | Freq: Two times a day (BID) | ORAL | Status: DC

## 2014-09-24 MED ORDER — PROPOFOL 10 MG/ML IV BOLUS
INTRAVENOUS | Status: AC
  Filled 2014-09-24: qty 20

## 2014-09-24 MED ORDER — IOHEXOL 300 MG/ML  SOLN
INTRAMUSCULAR | Status: DC | PRN
  Administered 2014-09-24: 3 mL

## 2014-09-24 MED ORDER — FENTANYL CITRATE 0.05 MG/ML IJ SOLN
25.0000 ug | INTRAMUSCULAR | Status: DC | PRN
  Administered 2014-09-24 (×2): 50 ug via INTRAVENOUS

## 2014-09-24 MED ORDER — KCL IN DEXTROSE-NACL 20-5-0.45 MEQ/L-%-% IV SOLN
INTRAVENOUS | Status: DC | PRN
  Administered 2014-09-24: 15:00:00 via INTRAVENOUS

## 2014-09-24 MED ORDER — LIDOCAINE HCL (CARDIAC) 20 MG/ML IV SOLN
INTRAVENOUS | Status: AC
  Filled 2014-09-24: qty 5

## 2014-09-24 MED ORDER — HYDROMORPHONE HCL 1 MG/ML IJ SOLN
INTRAMUSCULAR | Status: AC
  Filled 2014-09-24: qty 1

## 2014-09-24 MED ORDER — LACTATED RINGERS IV SOLN: Freq: Once | INTRAVENOUS | Status: DC

## 2014-09-24 MED ORDER — ZOLPIDEM TARTRATE 5 MG PO TABS: 5.0000 mg | ORAL_TABLET | Freq: Every evening | ORAL | Status: DC | PRN

## 2014-09-24 MED ORDER — KCL IN DEXTROSE-NACL 20-5-0.45 MEQ/L-%-% IV SOLN
INTRAVENOUS | Status: AC
  Administered 2014-09-24: 1000 mL
  Filled 2014-09-24: qty 1000

## 2014-09-24 MED ORDER — TIZANIDINE HCL 4 MG PO TABS
4.0000 mg | ORAL_TABLET | Freq: Four times a day (QID) | ORAL | Status: DC | PRN
  Administered 2014-09-27: 4 mg via ORAL
  Filled 2014-09-24 (×3): qty 1

## 2014-09-24 MED ORDER — 0.9 % SODIUM CHLORIDE (POUR BTL) OPTIME
TOPICAL | Status: DC | PRN
  Administered 2014-09-24: 1000 mL

## 2014-09-24 MED ORDER — DIPHENHYDRAMINE HCL 50 MG/ML IJ SOLN: 12.5000 mg | Freq: Four times a day (QID) | INTRAMUSCULAR | Status: DC | PRN

## 2014-09-24 MED ORDER — ONDANSETRON HCL 4 MG/2ML IJ SOLN
4.0000 mg | Freq: Once | INTRAMUSCULAR | Status: AC
  Administered 2014-09-24: 4 mg via INTRAVENOUS
  Filled 2014-09-24: qty 2

## 2014-09-24 MED ORDER — KCL IN DEXTROSE-NACL 20-5-0.45 MEQ/L-%-% IV SOLN
INTRAVENOUS | Status: DC
  Administered 2014-09-24 – 2014-09-26 (×5): via INTRAVENOUS
  Filled 2014-09-24 (×10): qty 1000

## 2014-09-24 MED ORDER — ONDANSETRON HCL 4 MG/2ML IJ SOLN
INTRAMUSCULAR | Status: DC | PRN
  Administered 2014-09-24: 4 mg via INTRAVENOUS

## 2014-09-24 MED ORDER — ACETAMINOPHEN 10 MG/ML IV SOLN
1000.0000 mg | Freq: Once | INTRAVENOUS | Status: AC
  Administered 2014-09-24: 1000 mg via INTRAVENOUS
  Filled 2014-09-24: qty 100

## 2014-09-24 MED ORDER — DIPHENHYDRAMINE HCL 12.5 MG/5ML PO ELIX: 12.5000 mg | ORAL_SOLUTION | Freq: Four times a day (QID) | ORAL | Status: DC | PRN

## 2014-09-24 MED ORDER — SODIUM CHLORIDE 0.9 % IV SOLN: 1000.0000 mL | Freq: Once | INTRAVENOUS | Status: DC

## 2014-09-24 MED ORDER — DIPHENHYDRAMINE HCL 25 MG PO CAPS
25.0000 mg | ORAL_CAPSULE | Freq: Once | ORAL | Status: AC
  Administered 2014-09-24: 25 mg via ORAL
  Filled 2014-09-24: qty 1

## 2014-09-24 MED ORDER — CEFTRIAXONE SODIUM IN DEXTROSE 20 MG/ML IV SOLN
1.0000 g | INTRAVENOUS | Status: DC
  Administered 2014-09-25 – 2014-09-26 (×2): 1 g via INTRAVENOUS
  Filled 2014-09-24 (×3): qty 50

## 2014-09-24 MED ORDER — ONDANSETRON HCL 4 MG/2ML IJ SOLN
INTRAMUSCULAR | Status: AC
  Filled 2014-09-24: qty 2

## 2014-09-24 MED ORDER — DEXTROSE 5 % IV SOLN
1.0000 g | Freq: Once | INTRAVENOUS | Status: AC
  Administered 2014-09-24: 1 g via INTRAVENOUS
  Filled 2014-09-24: qty 10

## 2014-09-24 MED ORDER — PROPOFOL 10 MG/ML IV BOLUS
INTRAVENOUS | Status: DC | PRN
  Administered 2014-09-24: 200 mg via INTRAVENOUS

## 2014-09-24 MED ORDER — ACETAMINOPHEN 500 MG PO TABS: 1000.0000 mg | ORAL_TABLET | Freq: Once | ORAL | Status: DC

## 2014-09-24 MED ORDER — HYDROMORPHONE HCL 1 MG/ML IJ SOLN
0.5000 mg | INTRAMUSCULAR | Status: DC | PRN
  Administered 2014-09-24: 1 mg via INTRAVENOUS
  Filled 2014-09-24: qty 1

## 2014-09-24 MED ORDER — MORPHINE SULFATE 4 MG/ML IJ SOLN
6.0000 mg | INTRAMUSCULAR | Status: AC | PRN
  Administered 2014-09-24 (×3): 6 mg via INTRAVENOUS
  Filled 2014-09-24 (×3): qty 2

## 2014-09-24 MED ORDER — KETOROLAC TROMETHAMINE 30 MG/ML IJ SOLN
30.0000 mg | Freq: Three times a day (TID) | INTRAMUSCULAR | Status: AC
  Administered 2014-09-24 – 2014-09-25 (×3): 30 mg via INTRAVENOUS
  Filled 2014-09-24 (×5): qty 1

## 2014-09-24 MED ORDER — SODIUM CHLORIDE 0.9 % IV BOLUS (SEPSIS)
1000.0000 mL | Freq: Once | INTRAVENOUS | Status: AC
  Administered 2014-09-24: 1000 mL via INTRAVENOUS

## 2014-09-24 MED ORDER — OXYCODONE-ACETAMINOPHEN 5-325 MG PO TABS
ORAL_TABLET | ORAL | Status: AC
  Filled 2014-09-24: qty 2

## 2014-09-24 MED ORDER — DEXAMETHASONE SODIUM PHOSPHATE 10 MG/ML IJ SOLN
INTRAMUSCULAR | Status: DC | PRN
  Administered 2014-09-24: 10 mg via INTRAVENOUS

## 2014-09-24 SURGICAL SUPPLY — 14 items
BAG URO CATCHER STRL LF (DRAPE) ×1 IMPLANT
CATH INTERMIT  6FR 70CM (CATHETERS) ×1 IMPLANT
GLOVE BIOGEL PI IND STRL 6.5 (GLOVE) IMPLANT
GLOVE BIOGEL PI IND STRL 7.5 (GLOVE) IMPLANT
GLOVE BIOGEL PI INDICATOR 6.5 (GLOVE) ×1
GLOVE BIOGEL PI INDICATOR 7.5 (GLOVE) ×1
GOWN STRL REUS W/ TWL LRG LVL3 (GOWN DISPOSABLE) IMPLANT
GOWN STRL REUS W/ TWL XL LVL3 (GOWN DISPOSABLE) IMPLANT
GOWN STRL REUS W/TWL LRG LVL3 (GOWN DISPOSABLE) ×2
GOWN STRL REUS W/TWL XL LVL3 (GOWN DISPOSABLE) ×2
GUIDEWIRE STR DUAL SENSOR (WIRE) ×1 IMPLANT
PACK CYSTO (CUSTOM PROCEDURE TRAY) ×1 IMPLANT
STENT URET 6FRX24 CONTOUR (STENTS) ×1 IMPLANT
TUBING CONNECTING 10 (TUBING) ×1 IMPLANT

## 2014-09-24 NOTE — H&P (Signed)
H&P  Chief Complaint: Kidney stone  History of Present Illness: [name] is a 34 y.o. year old seen at the request of Dr. Jola Schmidt for a 5 mm left UPJ stone with leukocytosis, UTI, and tachycardia.  She has an extensive history of urolithiasis and recurrent UTIs and has required multiple procedures in the past by her primary urologist, Dr. Janice Norrie.  She developed the acute onset of severe left flank pain yesterday that worsened this morning.  This has been associated with chills but no objective fever and also nausea.  She presented to the ED today and was tachycardic with a HR of 130s (now improved with IVF hydration), WBC of 15,000, and a UA that appears infected.  Urine culture has been obtained and she has been empirically started on ceftriaxone in the ED.  Past Medical History  Diagnosis Date  . GLUCOSE INTOLERANCE 03/19/2009  . COMMON MIGRAINE 03/19/2009  . ALLERGIC RHINITIS 10/11/2009  .  05/11/2009  .  05/11/2009  .  04/03/2009  . BACK PAIN 03/19/2009  . INSOMNIA-SLEEP [facility] 03/19/2009    05/11/2009  . NEPHROLITHIASIS, HX OF 03/19/2009  . Allergy to latex 04/11/2010  . ADD (attention deficit disorder)     testing done 2016    Past Surgical History  Procedure Laterality Date  . Abdominal hysterectomy    . Appendectomy  1994  . Oophorectomy  07/2008  . S/p left knee arthroscopic  1994  . Hx of etopic pregnancy  2005  . Lithotripsy  2011  . Eye surgery Bilateral     lens replacement 2014    Home Medications:    Medication List    ASK your doctor about these medications        EPINEPHrine 0.3 mg/0.3 mL Soaj injection  Commonly known as:  EPI-PEN  Inject 0.3 mLs (0.3 mg total) into the muscle once.     methylphenidate 5 MG tablet  Commonly known as:  RITALIN  Take 1-2 tablets (5-10 mg total) by mouth 2 (two) times daily.     tiZANidine 4 MG tablet  Commonly known as:  ZANAFLEX  TAKE 1 TABLET EVERY 6 HOURS AS NEEDED     traMADol 50 MG tablet  Commonly  known as:  ULTRAM  TAKE 1 TABLET BY MOUTH EVERY 6 HOURS AS NEEDED     zolpidem 12.5 MG CR tablet  Commonly known as:  AMBIEN CR  TAKE 1 TABLET BY MOUTH AT BEDTIME        Allergies:  Allergies  Allergen Reactions  . Latex     Carries epipen  . Amoxicillin-Pot Clavulanate   . Aspirin   . Ibuprofen   . Nsaids   . Penicillins   . Red Dye     rash    Family History  Problem Relation Age of Onset  . Alcohol abuse Mother     ETOH  . Bipolar disorder Mother     manic depressive/bipolar  . Thyroid disease Mother     Low thyroid  . Hypertension Father   . Hypertension Other   . Diabetes Other   . Heart disease Other   . Stroke Other   . Cancer Other     Lung Cancer    Social History:  reports that she has quit smoking. Her smoking use included Cigarettes. She has never used smokeless tobacco. She reports that she does not drink alcohol or use illicit drugs.  ROS: A complete review of systems was performed.  All systems  are negative except for pertinent findings as noted.  Physical Exam:  Vital signs in last 24 hours: Temp:  [97.6 F (36.4 C)-98.4 F (36.9 C)] 98.4 F (36.9 C) (03/20 1115) Pulse Rate:  [104-138] 104 (03/20 1115) Resp:  [20] 20 (03/20 1115) BP: (98-102)/(55-60) 102/60 mmHg (03/20 1115) SpO2:  [95 %-99 %] 95 % (03/20 1115) Constitutional:  Alert and oriented, No acute distress Cardiovascular: Regular, tachycardic, No JVD Respiratory: Normal respiratory effort, Lungs clear bilaterally GI: Abdomen is soft, nontender, nondistended, no abdominal masses GU: Mild L CVAT. Lymphatic: No lymphadenopathy Neurologic: Grossly intact, no focal deficits Psychiatric: Normal mood and affect  Laboratory Data:   Recent Labs  09/24/14 0826  WBC 15.2*  HGB 14.4  HCT 43.0  PLT 267     Recent Labs  09/24/14 0826  NA 140  K 3.8  CL 107  GLUCOSE 99  BUN 26*  CALCIUM 9.3  CREATININE 1.38*     Results for orders placed or performed during the  hospital encounter of 09/24/14 (from the past 24 hour(s))  CBC with Differential     Status: Abnormal   Collection Time: 09/24/14  8:26 AM  Result Value Ref Range   WBC 15.2 (H) 4.0 - 10.5 K/uL   RBC 4.99 3.87 - 5.11 MIL/uL   Hemoglobin 14.4 12.0 - 15.0 g/dL   HCT 43.0 36.0 - 46.0 %   MCV 86.2 78.0 - 100.0 fL   MCH 28.9 26.0 - 34.0 pg   MCHC 33.5 30.0 - 36.0 g/dL   RDW 13.4 11.5 - 15.5 %   Platelets 267 150 - 400 K/uL   Neutrophils Relative % 89 (H) 43 - 77 %   Neutro Abs 13.5 (H) 1.7 - 7.7 K/uL   Lymphocytes Relative 5 (L) 12 - 46 %   Lymphs Abs 0.8 0.7 - 4.0 K/uL   Monocytes Relative 6 3 - 12 %   Monocytes Absolute 0.9 0.1 - 1.0 K/uL   Eosinophils Relative 0 0 - 5 %   Eosinophils Absolute 0.0 0.0 - 0.7 K/uL   Basophils Relative 0 0 - 1 %   Basophils Absolute 0.0 0.0 - 0.1 K/uL  Comprehensive metabolic panel     Status: Abnormal   Collection Time: 09/24/14  8:26 AM  Result Value Ref Range   Sodium 140 135 - 145 mmol/L   Potassium 3.8 3.5 - 5.1 mmol/L   Chloride 107 96 - 112 mmol/L   CO2 24 19 - 32 mmol/L   Glucose, Bld 99 70 - 99 mg/dL   BUN 26 (H) 6 - 23 mg/dL   Creatinine, Ser 1.38 (H) 0.50 - 1.10 mg/dL   Calcium 9.3 8.4 - 10.5 mg/dL   Total Protein 7.2 6.0 - 8.3 g/dL   Albumin 4.3 3.5 - 5.2 g/dL   AST 29 0 - 37 U/L   ALT 24 0 - 35 U/L   Alkaline Phosphatase 96 39 - 117 U/L   Total Bilirubin 0.9 0.3 - 1.2 mg/dL   GFR calc non Af Amer 49 (L) >90 mL/min   GFR calc Af Amer 57 (L) >90 mL/min   Anion gap 9 5 - 15  Urinalysis, Routine w reflex microscopic     Status: Abnormal   Collection Time: 09/24/14  9:43 AM  Result Value Ref Range   Color, Urine YELLOW YELLOW   APPearance CLOUDY (A) CLEAR   Specific Gravity, Urine 1.021 1.005 - 1.030   pH 5.5 5.0 - 8.0   Glucose, UA  NEGATIVE NEGATIVE mg/dL   Hgb urine dipstick MODERATE (A) NEGATIVE   Bilirubin Urine NEGATIVE NEGATIVE   Ketones, ur 40 (A) NEGATIVE mg/dL   Protein, ur NEGATIVE NEGATIVE mg/dL   Urobilinogen, UA  0.2 0.0 - 1.0 mg/dL   Nitrite POSITIVE (A) NEGATIVE   Leukocytes, UA LARGE (A) NEGATIVE  Urine microscopic-add on     Status: Abnormal   Collection Time: 09/24/14  9:43 AM  Result Value Ref Range   WBC, UA 21-50 <3 WBC/hpf   RBC / HPF 3-6 <3 RBC/hpf   Bacteria, UA MANY (A) RARE   Urine-Other MUCOUS PRESENT   I-Stat CG4 Lactic Acid, ED     Status: None   Collection Time: 09/24/14 10:03 AM  Result Value Ref Range   Lactic Acid, Venous 0.96 0.5 - 2.0 mmol/L   No results found for this or any previous visit (from the past 240 hour(s)).  Renal Function:  Recent Labs  09/24/14 0826  CREATININE 1.38*   CrCl cannot be calculated (Unknown ideal weight.).  Radiologic Imaging: Ct Abdomen Pelvis Wo Contrast  09/24/2014   CLINICAL DATA:  Left flank pain. Left renal pelvis dilation noted on the current renal ultrasound.  EXAM: CT ABDOMEN AND PELVIS WITHOUT CONTRAST  TECHNIQUE: Multidetector CT imaging of the abdomen and pelvis was performed following the standard protocol without IV contrast.  COMPARISON:  Current renal ultrasound. Prior abdomen and pelvis CT, 12/31/2012  FINDINGS: 5 mm stone lies in the proximal left ureter at the ureteropelvic junction. This leads to mild left hydronephrosis. Left kidney is swollen with perinephric stranding. The ureter below stone is normal in caliber. No additional ureteral stones. No bladder stone. 6 mm nonobstructing stone lies in the upper pole of the left kidney. No other intrarenal stones. No renal masses. Normal right kidney, ureter and bladder.  Clear lung bases.  Heart normal size.  Diffuse fatty infiltration of the liver. No liver mass or focal lesion.  Spleen, gallbladder, pancreas, adrenal glands:  Normal.  Uterus surgically absent.  No pelvic masses.  Colon and small bowel are unremarkable.  No significant bony abnormality.  IMPRESSION: 1. 5 mm stone at the left ureteropelvic junction causes mild left hydronephrosis as well left renal swelling and  perinephric stranding. 2. No other acute finding. 3. 6 mm nonobstructing stone in the upper pole of the left kidney. 4. Hepatic steatosis.   Electronically Signed   By: Lajean Manes M.D.   On: 09/24/2014 10:45   US Renal  09/24/2014   CLINICAL DATA:  Left flank pain for 2 days. History of kidney stones.  EXAM: RENAL/URINARY TRACT ULTRASOUND COMPLETE  COMPARISON:  CT, 12/31/2012  FINDINGS: Right Kidney:  Length: 10.4 cm. Echogenicity within normal limits. No mass or hydronephrosis visualized.  Left Kidney:  Length: 11.3 cm. Normal parenchymal echogenicity. 7 mm stone in the upper pole. Mild dilation of the renal pelvis.  Bladder:  Bladder minimally distended, otherwise unremarkable. Left ureteral jet not demonstrated.  IMPRESSION: 1. Mild dilation of left renal pelvis as well as absence of the left ureteral jet at the bladder. A ureteral stone should be considered given history. Followup CT scan without contrast is recommended. 2. 7 mm nonobstructing stone in the upper pole of the right kidney. 3. No other abnormalities.   Electronically Signed   By: Lajean Manes M.D.   On: 09/24/2014 10:06    Impression/Assessment:  Left ureteral stone with UTI and signs of systemic infection  Plan:  I recommended she proceed to  the OR today for cystoscopy and left ureteral stent placement considering her clinical scenario which is concerning for renal infection. I discussed the potential benefits and risks of the procedure, side effects of the proposed treatment, the likelihood of the patient achieving the goals of the procedure, and any potential problems that might occur during the procedure or recuperation.  She will continue IV ceftriaxone pending cultures and IVF hydration and pain control.  I will notify Dr. Janice Norrie of patient's admission.  Antero Derosia,LES 09/24/2014, 12:07 PM  Pryor Curia MD

## 2014-09-24 NOTE — Anesthesia Postprocedure Evaluation (Signed)
  Anesthesia Post-op Note  Patient: [name]  Procedure(s) Performed: Procedure(s): CYSTOSCOPY WITH RETROGRADE PYELOGRAM/LEFT URETERAL STENT PLACEMENT (Left) Patient is awake and responsive. Pain and nausea are reasonably well controlled. Vital signs are stable and clinically acceptable. Oxygen saturation is clinically acceptable. There are no apparent anesthetic complications at this time. Patient is ready for discharge.

## 2014-09-24 NOTE — ED Provider Notes (Signed)
CSN: [id number]     Arrival date & time 09/24/14  7902 History   First MD Initiated Contact with Patient 09/24/14 [phone]     Chief Complaint  Patient presents with  . Flank Pain      HPI Patient with nausea and vomiting since last night and now here with moderate to severe left-sided flank pain.  She states that she's had chills at home.  He was found to be tachycardic on arrival to the emergency department with a heart rate of 138.  She reports a history of kidney stones in the past.  She has required ureteral stenting before.  She denies dysuria but reports urinary hesitancy.  She reports mild left lower abdominal pain.  She denies chest pain or shortness of breath.  No recent injury or trauma.  No heavy lifting.  No weakness in her arms or legs.   Past Medical History  Diagnosis Date  . GLUCOSE INTOLERANCE 03/19/2009  . COMMON MIGRAINE 03/19/2009  . ALLERGIC RHINITIS 10/11/2009  . HYDRONEPHROSIS, RIGHT 05/11/2009  . RENAL CALCULUS, RIGHT 05/11/2009  . ANKLE PAIN, RIGHT 04/03/2009  . BACK PAIN 03/19/2009  . INSOMNIA-SLEEP DISORDER-UNSPEC 03/19/2009  . FLANK PAIN, RIGHT 05/11/2009  . NEPHROLITHIASIS, HX OF 03/19/2009  . Allergy to latex 04/11/2010  . ADD (attention deficit disorder)     testing done 2016   Past Surgical History  Procedure Laterality Date  . Abdominal hysterectomy    . Appendectomy  1994  . Oophorectomy  07/2008  . S/p left knee arthroscopic  1994  . Hx of etopic pregnancy  2005  . Lithotripsy  2011  . Eye surgery Bilateral     lens replacement 2014   Family History  Problem Relation Age of Onset  . Alcohol abuse Mother     ETOH  . Bipolar disorder Mother     manic depressive/bipolar  . Thyroid disease Mother     Low thyroid  . Hypertension Father   . Hypertension Other   . Diabetes Other   . Heart disease Other   . Stroke Other   . Cancer Other     Lung Cancer   History  Substance Use Topics  . Smoking status: Former Smoker    Types: Cigarettes  .  Smokeless tobacco: Never Used     Comment: quit with chantix 2010  . Alcohol Use: No     Comment: rare   OB History    No data available     Review of Systems  All other systems reviewed and are negative.     Allergies  Latex; Amoxicillin-pot clavulanate; Aspirin; Ibuprofen; Nsaids; Penicillins; and Red dye  Home Medications   Prior to Admission medications   Medication Sig Start Date End Date Taking? Authorizing Provider  methylphenidate (RITALIN) 5 MG tablet Take 1-2 tablets (5-10 mg total) by mouth 2 (two) times daily. 09/13/14  Yes Tonia Ghent, MD  tiZANidine (ZANAFLEX) 4 MG tablet TAKE 1 TABLET EVERY 6 HOURS AS NEEDED 08/15/14  Yes Tonia Ghent, MD  traMADol (ULTRAM) 50 MG tablet TAKE 1 TABLET BY MOUTH EVERY 6 HOURS AS NEEDED 07/31/14  Yes Tonia Ghent, MD  zolpidem (AMBIEN CR) 12.5 MG CR tablet TAKE 1 TABLET BY MOUTH AT BEDTIME 08/01/14  Yes Tonia Ghent, MD  EPINEPHrine (EPI-PEN) 0.3 mg/0.3 mL SOAJ injection Inject 0.3 mLs (0.3 mg total) into the muscle once. 08/15/13   Jearld Fenton, NP   BP 98/55 mmHg  Pulse 138  Temp(Src)  97.6 F (36.4 C) (Oral)  Resp 20  SpO2 99% Physical Exam  Constitutional: She is oriented to person, place, and time. She appears well-developed and well-nourished.  Uncomfortable appearing  HENT:  Head: Normocephalic and atraumatic.  Eyes: EOM are normal.  Neck: Normal range of motion.  Cardiovascular: Normal rate, regular rhythm and normal heart sounds.   Pulmonary/Chest: Effort normal and breath sounds normal.  Abdominal: Soft. She exhibits no distension. There is no tenderness.  Genitourinary:  Mild left CVA tenderness  Musculoskeletal: Normal range of motion.  Neurological: She is alert and oriented to person, place, and time.  Skin: Skin is warm and dry.  Psychiatric: She has a normal mood and affect. Judgment normal.  Nursing note and vitals reviewed.   ED Course  Procedures (including critical care time) Labs  Review Labs Reviewed  CBC WITH DIFFERENTIAL/PLATELET - Abnormal; Notable for the following:    WBC 15.2 (*)    Neutrophils Relative % 89 (*)    Neutro Abs 13.5 (*)    Lymphocytes Relative 5 (*)    All other components within normal limits  COMPREHENSIVE METABOLIC PANEL - Abnormal; Notable for the following:    BUN 26 (*)    Creatinine, Ser 1.38 (*)    GFR calc non Af Amer 49 (*)    GFR calc Af Amer 57 (*)    All other components within normal limits  URINALYSIS, ROUTINE W REFLEX MICROSCOPIC - Abnormal; Notable for the following:    APPearance CLOUDY (*)    Hgb urine dipstick MODERATE (*)    Ketones, ur 40 (*)    Nitrite POSITIVE (*)    Leukocytes, UA LARGE (*)    All other components within normal limits  URINE MICROSCOPIC-ADD ON - Abnormal; Notable for the following:    Bacteria, UA MANY (*)    All other components within normal limits  URINE CULTURE  I-STAT CG4 LACTIC ACID, ED    Imaging Review Ct Abdomen Pelvis Wo Contrast  09/24/2014   CLINICAL DATA:  Left flank pain. Left renal pelvis dilation noted on the current renal ultrasound.  EXAM: CT ABDOMEN AND PELVIS WITHOUT CONTRAST  TECHNIQUE: Multidetector CT imaging of the abdomen and pelvis was performed following the standard protocol without IV contrast.  COMPARISON:  Current renal ultrasound. Prior abdomen and pelvis CT, 12/31/2012  FINDINGS: 5 mm stone lies in the proximal left ureter at the ureteropelvic junction. This leads to mild left hydronephrosis. Left kidney is swollen with perinephric stranding. The ureter below stone is normal in caliber. No additional ureteral stones. No bladder stone. 6 mm nonobstructing stone lies in the upper pole of the left kidney. No other intrarenal stones. No renal masses. Normal right kidney, ureter and bladder.  Clear lung bases.  Heart normal size.  Diffuse fatty infiltration of the liver. No liver mass or focal lesion.  Spleen, gallbladder, pancreas, adrenal glands:  Normal.  Uterus  surgically absent.  No pelvic masses.  Colon and small bowel are unremarkable.  No significant bony abnormality.  IMPRESSION: 1. 5 mm stone at the left ureteropelvic junction causes mild left hydronephrosis as well left renal swelling and perinephric stranding. 2. No other acute finding. 3. 6 mm nonobstructing stone in the upper pole of the left kidney. 4. Hepatic steatosis.   Electronically Signed   By: Lajean Manes M.D.   On: 09/24/2014 10:45   US Renal  09/24/2014   CLINICAL DATA:  Left flank pain for 2 days. History of kidney stones.  EXAM: RENAL/URINARY TRACT ULTRASOUND COMPLETE  COMPARISON:  CT, 12/31/2012  FINDINGS: Right Kidney:  Length: 10.4 cm. Echogenicity within normal limits. No mass or hydronephrosis visualized.  Left Kidney:  Length: 11.3 cm. Normal parenchymal echogenicity. 7 mm stone in the upper pole. Mild dilation of the renal pelvis.  Bladder:  Bladder minimally distended, otherwise unremarkable. Left ureteral jet not demonstrated.  IMPRESSION: 1. Mild dilation of left renal pelvis as well as absence of the left ureteral jet at the bladder. A ureteral stone should be considered given history. Followup CT scan without contrast is recommended. 2. 7 mm nonobstructing stone in the upper pole of the right kidney. 3. No other abnormalities.   Electronically Signed   By: Lajean Manes M.D.   On: 09/24/2014 10:06  I personally reviewed the imaging tests through PACS system I reviewed available ER/hospitalization records through the EMR    EKG Interpretation   Date/Time:  Sunday September 24 2014 08:08:14 EDT Ventricular Rate:  109 PR Interval:  141 QRS Duration: 82 QT Interval:  373 QTC Calculation: 502 R Axis:   91 Text Interpretation:  Sinus tachycardia Probable left atrial enlargement  Borderline right axis deviation Borderline T abnormalities, anterior leads  Prolonged QT interval No old tracing to compare Confirmed by Manar Smalling  MD,  Sandro Burgo ([id number]) on 09/24/2014 8:13:11 AM      MDM    Final diagnoses:  None    10:54 AM Infected left proximal ureteral stone.  Heart rate improving.  Heart rate now 107.  Pain improving as well.  IV Rocephin now.  Urine culture sent.  We'll consult with urology for possible stent placement    Jola Schmidt, MD 09/24/14 1056

## 2014-09-24 NOTE — ED Notes (Signed)
Pt presents with c/o left flank pain and difficulty urinating, also n/v since yesterday. Hx of kidney stones. Pt is tachy in triage (138)

## 2014-09-24 NOTE — Op Note (Signed)
Preoperative diagnosis:  1. Left ureteral stone 2. Urinary tract infection   Postoperative diagnosis:  Same as above   Procedure:  1. Cystoscopy 2. Left ureteral stent placement (6 x 24 - no string)  3. Left retrograde pyelography with interpretation   Surgeon: Pryor Curia. M.D.  Anesthesia: General  Complications: None  Intraoperative findings: Left retrograde pyelogram was performed with omnipaque contrast via a 6 Fr ureteral catheter.  This demonstrated a filling defect within the proximal left ureter consistent with the patient's known stone without other abnormalities noted within the ureter.  The renal collecting system was not completely distended to avoid pyelovenous backflow in the setting of infection.  EBL: Minimal  Specimens: None  Indication: [name] is a 34 y.o. patient with a left ureteral stone and urinary tract infection. After reviewing the management options for treatment, he elected to proceed with the above surgical procedure(s). We have discussed the potential benefits and risks of the procedure, side effects of the proposed treatment, the likelihood of the patient achieving the goals of the procedure, and any potential problems that might occur during the procedure or recuperation. Informed consent has been obtained.  Description of procedure:  The patient was taken to the operating room and general anesthesia was induced.  The patient was placed in the dorsal lithotomy position, prepped and draped in the usual sterile fashion, and preoperative antibiotics were administered. A preoperative time-out was performed.   Cystourethroscopy was performed.  The patient's urethra was examined and was normal. The bladder was then systematically examined in its entirety. There was no evidence for any bladder tumors, stones, or other mucosal pathology.    Attention then turned to the left ureteral orifice and a ureteral catheter was used to intubate the  ureteral orifice.  Omnipaque contrast was injected through the ureteral catheter and a retrograde pyelogram was performed with findings as dictated above.  A 0.38 sensor guidewire was then advanced up the left ureter into the renal pelvis under fluoroscopic guidance.  The wire was then backloaded through the cystoscope and a ureteral stent was advance over the wire using Seldinger technique.  The stent was positioned appropriately under fluoroscopic and cystoscopic guidance.  The wire was then removed with an adequate stent curl noted in the renal pelvis as well as in the bladder.  The bladder was then emptied and the procedure ended.  The patient appeared to tolerate the procedure well and without complications.  The patient was able to be awakened and transferred to the recovery unit in satisfactory condition.    Pryor Curia MD

## 2014-09-24 NOTE — Progress Notes (Signed)
Courtesy call to dr. Alinda Money to give update on patient.    Initial temp 103.3 hr 128 and bp dropped to 65'V systolic  3748 LR bolus given, IV tylenol given  At time of call to dr. Alinda Money, temp 101, hr 93, bp 107/55  Also discussed pain management iv vs po tonight.  Orders received.  Explained plan of care to patient and she voices understanding.  Pt will go back to room 1405 and will be placed on telemetry overnight per dr. Alinda Money

## 2014-09-24 NOTE — Transfer of Care (Signed)
Immediate Anesthesia Transfer of Care Note  Patient: [name]  Procedure(s) Performed: Procedure(s): CYSTOSCOPY WITH RETROGRADE PYELOGRAM/LEFT URETERAL STENT PLACEMENT (Left)  Patient Location: PACU  Anesthesia Type:General  Level of Consciousness: awake, alert  and oriented  Airway & Oxygen Therapy: Patient Spontanous Breathing and Patient connected to face mask oxygen  Post-op Assessment: Report given to RN and Post -op Vital signs reviewed and stable  Post vital signs: Reviewed and stable  Last Vitals:  Filed Vitals:   09/24/14 1331  BP: 124/58  Pulse: 94  Temp:   Resp: 18    Complications: No apparent anesthesia complications

## 2014-09-24 NOTE — Anesthesia Preprocedure Evaluation (Addendum)
Anesthesia Evaluation  Patient identified by MRN, date of birth, ID band Patient awake    Reviewed: Allergy & Precautions, H&P , Patient's Chart, lab work & pertinent test results, reviewed documented beta blocker date and time   Airway Mallampati: II  TM Distance: >3 FB Neck ROM: full    Dental no notable dental hx.    Pulmonary former smoker,  breath sounds clear to auscultation  Pulmonary exam normal       Cardiovascular Rhythm:regular Rate:Normal     Neuro/Psych    GI/Hepatic   Endo/Other    Renal/GU      Musculoskeletal   Abdominal   Peds  Hematology   Anesthesia Other Findings   Reproductive/Obstetrics                            Anesthesia Physical Anesthesia Plan  ASA: II  Anesthesia Plan:    Post-op Pain Management:    Induction: Intravenous  Airway Management Planned: LMA  Additional Equipment:   Intra-op Plan:   Post-operative Plan:   Informed Consent: I have reviewed the patients History and Physical, chart, labs and discussed the procedure including the risks, benefits and alternatives for the proposed anesthesia with the patient or authorized representative who has indicated his/her understanding and acceptance.   Dental Advisory Given and Dental advisory given  Plan Discussed with: CRNA and Surgeon  Anesthesia Plan Comments: (Discussed GA with LMA, possible sore throat, potential need to switch to ETT, N/V, pulmonary aspiration. Questions answered. )        Anesthesia Quick Evaluation

## 2014-09-24 NOTE — Anesthesia Procedure Notes (Signed)
Procedure Name: LMA Insertion Date/Time: 09/24/2014 3:03 PM Performed by: Glory Buff Pre-anesthesia Checklist: Patient identified, Emergency Drugs available, Suction available, Patient being monitored and Timeout performed Patient Re-evaluated:Patient Re-evaluated prior to inductionOxygen Delivery Method: Circle system utilized Preoxygenation: Pre-oxygenation with 100% oxygen Intubation Type: IV induction Ventilation: Mask ventilation without difficulty LMA: LMA inserted LMA Size: 4.0 Number of attempts: 1 Placement Confirmation: positive ETCO2 Tube secured with: Tape

## 2014-09-25 ENCOUNTER — Encounter (HOSPITAL_COMMUNITY): Payer: Self-pay | Admitting: Urology

## 2014-09-25 LAB — CBC
HCT: 35.8 % — ABNORMAL LOW (ref 36.0–46.0)
Hemoglobin: 11.8 g/dL — ABNORMAL LOW (ref 12.0–15.0)
MCH: 28.6 pg (ref 26.0–34.0)
MCHC: 33 g/dL (ref 30.0–36.0)
MCV: 86.7 fL (ref 78.0–100.0)
Platelets: 220 10*3/uL (ref 150–400)
RBC: 4.13 MIL/uL (ref 3.87–5.11)
RDW: 14 % (ref 11.5–15.5)
WBC: 31.3 10*3/uL — AB (ref 4.0–10.5)

## 2014-09-25 LAB — BASIC METABOLIC PANEL
Anion gap: 7 (ref 5–15)
BUN: 22 mg/dL (ref 6–23)
CHLORIDE: 107 mmol/L (ref 96–112)
CO2: 21 mmol/L (ref 19–32)
CREATININE: 1.3 mg/dL — AB (ref 0.50–1.10)
Calcium: 8.8 mg/dL (ref 8.4–10.5)
GFR calc Af Amer: 61 mL/min — ABNORMAL LOW (ref >90)
GFR calc non Af Amer: 53 mL/min — ABNORMAL LOW (ref >90)
Glucose, Bld: 159 mg/dL — ABNORMAL HIGH (ref 70–99)
Potassium: 4.8 mmol/L (ref 3.5–5.1)
Sodium: 135 mmol/L (ref 135–145)

## 2014-09-25 MED ORDER — OXYCODONE-ACETAMINOPHEN 5-325 MG PO TABS
1.0000 | ORAL_TABLET | ORAL | Status: DC | PRN
  Administered 2014-09-25 – 2014-09-27 (×7): 2 via ORAL
  Filled 2014-09-25 (×7): qty 2

## 2014-09-25 MED ORDER — OXYBUTYNIN CHLORIDE 5 MG PO TABS
5.0000 mg | ORAL_TABLET | Freq: Three times a day (TID) | ORAL | Status: DC
  Administered 2014-09-25 – 2014-09-26 (×5): 5 mg via ORAL
  Filled 2014-09-25 (×7): qty 1

## 2014-09-25 NOTE — Care Management Note (Addendum)
    Page 1 of 1   09/27/2014     11:02:47 AM CARE MANAGEMENT NOTE 09/27/2014  Patient:  [name], [name]   Account Number:  [id number]  Date Initiated:  09/25/2014  Documentation initiated by:  Dessa Phi  Subjective/Objective Assessment:   34 y/o f admitted w/renal infection.     Action/Plan:   From home.   Anticipated DC Date:  09/27/2014   Anticipated DC Plan:  [facility]  CM consult      Choice offered to / List presented to:             Status of service:  Completed, signed off Medicare Important Message given?   (If response is "NO", the following Medicare IM given date fields will be blank) Date Medicare IM given:   Medicare IM given by:   Date Additional Medicare IM given:   Additional Medicare IM given by:    Discharge Disposition:  HOME/SELF CARE  Per UR Regulation:  Reviewed for med. necessity/level of care/duration of stay  If discussed at [place] of Stay Meetings, dates discussed:    Comments:  09/27/14 Dessa Phi RN BSN NCm [phone] d/c home no needs or orders.  09/25/14 Dessa Phi RN BSN NCM [phone] POD#1 stent.No anticipated d/c needs.

## 2014-09-25 NOTE — Progress Notes (Signed)
1 Day Post-Op Subjective: Patient reports Left sided abdominal pain.  Objective: Vital signs in last 24 hours: Temp:  [97.9 F (36.6 C)-99.1 F (37.3 C)] 98.8 F (37.1 C) (03/21 1916) Pulse Rate:  [66-79] 66 (03/21 1740) Resp:  [16-18] 18 (03/21 0623) BP: (101-124)/(52-65) 124/61 mmHg (03/21 1740) SpO2:  [97 %-98 %] 98 % (03/21 1740) Weight:  [92.987 kg (205 lb)] 92.987 kg (205 lb) (03/21 1107)  Intake/Output from previous day: 03/20 0701 - 03/21 0700 In: 2800 [I.V.:2700; IV Piggyback:100] Out: -  Intake/Output this shift:    Physical Exam:  General:Alert and oriented Abd:  Soft.  Non distended. Lab Results:  Recent Labs  09/24/14 0826 09/25/14 0535  HGB 14.4 11.8*  HCT 43.0 35.8*   BMET  Recent Labs  09/24/14 0826 09/25/14 0535  NA 140 135  K 3.8 4.8  CL 107 107  CO2 24 21  GLUCOSE 99 159*  BUN 26* 22  CREATININE 1.38* 1.30*  CALCIUM 9.3 8.8   No results for input(s): LABPT, INR in the last 72 hours. No results for input(s): LABURIN in the last 72 hours. Results for orders placed or performed during the hospital encounter of 09/24/14  Surgical pcr screen     Status: None   Collection Time: 09/24/14  2:33 PM  Result Value Ref Range Status   MRSA, PCR NEGATIVE NEGATIVE Final   Staphylococcus aureus NEGATIVE NEGATIVE Final    Comment:        The Xpert SA Assay (FDA approved for NASAL specimens in patients over 24 years of age), is one component of a comprehensive surveillance program.  Test performance has been validated by [facility] for patients greater than or equal to 66 year old. It is not intended to diagnose infection nor to guide or monitor treatment.     Studies/Results: Ct Abdomen Pelvis Wo Contrast  09/24/2014   CLINICAL DATA:  Left flank pain. Left renal pelvis dilation noted on the current renal ultrasound.  EXAM: CT ABDOMEN AND PELVIS WITHOUT CONTRAST  TECHNIQUE: Multidetector CT imaging of the abdomen and pelvis was performed  following the standard protocol without IV contrast.  COMPARISON:  Current renal ultrasound. Prior abdomen and pelvis CT, 12/31/2012  FINDINGS: 5 mm stone lies in the proximal left ureter at the ureteropelvic junction. This leads to mild left hydronephrosis. Left kidney is swollen with perinephric stranding. The ureter below stone is normal in caliber. No additional ureteral stones. No bladder stone. 6 mm nonobstructing stone lies in the upper pole of the left kidney. No other intrarenal stones. No renal masses. Normal right kidney, ureter and bladder.  Clear lung bases.  Heart normal size.  Diffuse fatty infiltration of the liver. No liver mass or focal lesion.  Spleen, gallbladder, pancreas, adrenal glands:  Normal.  Uterus surgically absent.  No pelvic masses.  Colon and small bowel are unremarkable.  No significant bony abnormality.  IMPRESSION: 1. 5 mm stone at the left ureteropelvic junction causes mild left hydronephrosis as well left renal swelling and perinephric stranding. 2. No other acute finding. 3. 6 mm nonobstructing stone in the upper pole of the left kidney. 4. Hepatic steatosis.   Electronically Signed   By: Lajean Manes M.D.   On: 09/24/2014 10:45   US Renal  09/24/2014   CLINICAL DATA:  Left flank pain for 2 days. History of kidney stones.  EXAM: RENAL/URINARY TRACT ULTRASOUND COMPLETE  COMPARISON:  CT, 12/31/2012  FINDINGS: Right Kidney:  Length: 10.4 cm. Echogenicity within  normal limits. No mass or hydronephrosis visualized.  Left Kidney:  Length: 11.3 cm. Normal parenchymal echogenicity. 7 mm stone in the upper pole. Mild dilation of the renal pelvis.  Bladder:  Bladder minimally distended, otherwise unremarkable. Left ureteral jet not demonstrated.  IMPRESSION: 1. Mild dilation of left renal pelvis as well as absence of the left ureteral jet at the bladder. A ureteral stone should be considered given history. Followup CT scan without contrast is recommended. 2. 7 mm nonobstructing stone  in the upper pole of the right kidney. 3. No other abnormalities.   Electronically Signed   By: Lajean Manes M.D.   On: 09/24/2014 10:06    Assessment/Plan:       The abdominal pain could be secondary to the JJ stent  Left UPJ calculus.  Left renal calculus.  UTI.  Continue IV Rocephin. Anticholinergics.  If remains afebrile will discharge home in AM.   LOS: 1 day   [name] 09/25/2014, 8:29 PM

## 2014-09-26 LAB — URINE CULTURE: Colony Count: >=100000

## 2014-09-26 MED ORDER — HYDROMORPHONE HCL 2 MG PO TABS
4.0000 mg | ORAL_TABLET | ORAL | Status: DC | PRN
  Administered 2014-09-26 – 2014-09-27 (×3): 4 mg via ORAL
  Filled 2014-09-26 (×3): qty 2

## 2014-09-26 MED ORDER — CEPHALEXIN 500 MG PO CAPS
500.0000 mg | ORAL_CAPSULE | Freq: Four times a day (QID) | ORAL | Status: DC
  Administered 2014-09-26 – 2014-09-27 (×4): 500 mg via ORAL
  Filled 2014-09-26 (×6): qty 1

## 2014-09-26 MED ORDER — ONDANSETRON HCL 4 MG PO TABS
8.0000 mg | ORAL_TABLET | Freq: Three times a day (TID) | ORAL | Status: DC | PRN
  Administered 2014-09-27: 8 mg via ORAL
  Filled 2014-09-26: qty 2

## 2014-09-26 NOTE — Progress Notes (Signed)
2 Days Post-Op Subjective: Patient reports : Severe left flank pain, worse when she urinates..  Objective: Vital signs in last 24 hours: Temp:  [98.5 F (36.9 C)-99.6 F (37.6 C)] 99.1 F (37.3 C) (03/22 1102) Pulse Rate:  [66-99] 99 (03/22 0530) Resp:  [18] 18 (03/22 0530) BP: (120-129)/(61-79) 129/79 mmHg (03/22 0530) SpO2:  [97 %-98 %] 98 % (03/22 0530)  Intake/Output from previous day: 03/21 0701 - 03/22 0700 In: 3720 [P.O.:720; I.V.:3000] Out: 1201 [Urine:1201] Intake/Output this shift: Total I/O In: 50 [IV Piggyback:50] Out: 700 [Urine:700]  Physical Exam:  Abdomen: Soft.  Non distended.  Moderate left CVA tenderness.  Mild tenderness left lower quadrant Urine culture: gm negative rods.  Sensitivity: pending. She understands that she could have pain as long as the stent is in place. The pain is probably not related to obstruction.  Lab Results:  Recent Labs  09/24/14 0826 09/25/14 0535  HGB 14.4 11.8*  HCT 43.0 35.8*   BMET  Recent Labs  09/24/14 0826 09/25/14 0535  NA 140 135  K 3.8 4.8  CL 107 107  CO2 24 21  GLUCOSE 99 159*  BUN 26* 22  CREATININE 1.38* 1.30*  CALCIUM 9.3 8.8   No results for input(s): LABPT, INR in the last 72 hours. No results for input(s): LABURIN in the last 72 hours. Results for orders placed or performed during the hospital encounter of 09/24/14  Urine culture     Status: None (Preliminary result)   Collection Time: 09/24/14  9:43 AM  Result Value Ref Range Status   Specimen Description URINE, RANDOM  Final   Special Requests NONE  Final   Col[place] Count   Final    >=100,000 COLONIES/ML Performed at [facility]    Culture   Final    [place] Performed at [facility]    Report Status PENDING  Incomplete  Surgical pcr screen     Status: None   Collection Time: 09/24/14  2:33 PM  Result Value Ref Range Status   MRSA, PCR NEGATIVE NEGATIVE Final   Staphylococcus aureus NEGATIVE NEGATIVE  Final    Comment:        The Xpert SA Assay (FDA approved for NASAL specimens in patients over 60 years of age), is one component of a comprehensive surveillance program.  Test performance has been validated by [facility] for patients greater than or equal to 17 year old. It is not intended to diagnose infection nor to guide or monitor treatment.     Studies/Results: No results found.  Assessment/Plan:  Left proximal ureteral calculus.  Left renal calculus.  UTI  Dilaudid PRN for pain.    Cephalexin 500 mg every 6 hours till sensitivities are available.  Probable discharge in AM   LOS: 2 days   Arvil Persons 09/26/2014, 11:28 AM

## 2014-09-27 MED ORDER — HYDROMORPHONE HCL 4 MG PO TABS: 4.0000 mg | ORAL_TABLET | ORAL | Status: DC | PRN

## 2014-09-27 MED ORDER — OXYBUTYNIN CHLORIDE ER 5 MG PO TB24: 5.0000 mg | ORAL_TABLET | Freq: Every day | ORAL | Status: DC

## 2014-09-27 MED ORDER — ONDANSETRON HCL 4 MG PO TABS: 4.0000 mg | ORAL_TABLET | Freq: Three times a day (TID) | ORAL | Status: DC | PRN

## 2014-09-27 MED ORDER — CEPHALEXIN 500 MG PO CAPS: 500.0000 mg | ORAL_CAPSULE | Freq: Four times a day (QID) | ORAL | Status: DC

## 2014-09-27 NOTE — Discharge Summary (Signed)
Physician Discharge Summary  Patient ID: [name] MRN: [id number] DOB/AGE: 35-15-1982 35 y.o.  Admit date: 09/24/2014 Discharge date: 09/27/2014  Admission Diagnoses:Left proximal ureteral calculus, left renal calculus, Left hydronephrosis, UTI  Discharge Diagnoses:  Active Problems:   Renal infection   Ureteral stone   Discharged Condition: Improved  Hospital Course: Patient was admitted for severe left flank pain associated with nausea and chills, no fever. CT scan showed a 5 mm left proximal ureteral stone.  White count was 15,000.  She had a left JJ stent inserted by dr Alinda Money on 3/20.  She was started on Rocephin.  She spiked a temperature of 103 post op.She had severe left sided abdominal pain for 2 days that required continued hospitalization. Urine culture was positive for Klebsiella sensitive to ceftriaxone.  On 3/23 she was afebrile, felt better, had less pain.  She was discharged home on cephalexin.  She will be followed in 2 weeks for definitive management of the renal calculi.  Consults:None  Significant Diagnostic Studies: CT scan.  Urine culture  Treatments: Cystoscopy, left JJ stent placement.  Rocephin for Klebsiella UTI.  Discharge Exam: Blood pressure 113/61, pulse 56, temperature 97.4 F (36.3 C), temperature source Oral, resp. rate 18, height 5\' 8"  (1.727 m), weight 92.987 kg (205 lb), SpO2 95 %. Abdomen: Soft non distended, non tender.  No CVA tenderness. She voids well.  Urine is grossly clear.  Disposition: 01-Home or Self Care     Medication List    TAKE these medications        cephALEXin 500 MG capsule  Commonly known as:  KEFLEX  Take 1 capsule (500 mg total) by mouth 4 (four) times daily.     EPINEPHrine 0.3 mg/0.3 mL Soaj injection  Commonly known as:  EPI-PEN  Inject 0.3 mLs (0.3 mg total) into the muscle once.     HYDROmorphone 4 MG tablet  Commonly known as:  DILAUDID  Take 1 tablet (4 mg total) by mouth every 4 (four) hours as  needed for severe pain.     methylphenidate 5 MG tablet  Commonly known as:  RITALIN  Take 1-2 tablets (5-10 mg total) by mouth 2 (two) times daily.     ondansetron 4 MG tablet  Commonly known as:  ZOFRAN  Take 1 tablet (4 mg total) by mouth every 8 (eight) hours as needed for nausea or vomiting.     oxybutynin 5 MG 24 hr tablet  Commonly known as:  DITROPAN XL  Take 1 tablet (5 mg total) by mouth at bedtime.     tiZANidine 4 MG tablet  Commonly known as:  ZANAFLEX  TAKE 1 TABLET EVERY 6 HOURS AS NEEDED     traMADol 50 MG tablet  Commonly known as:  ULTRAM  TAKE 1 TABLET BY MOUTH EVERY 6 HOURS AS NEEDED     zolpidem 12.5 MG CR tablet  Commonly known as:  AMBIEN CR  TAKE 1 TABLET BY MOUTH AT BEDTIME           Follow-up Information    Follow up with Arvil Persons, MD In 2 weeks.   Specialty:  Urology   Contact information:   [place] [id number] [phone]       Signed: Arvil Persons 09/27/2014, 8:15 PM

## 2014-09-28 ENCOUNTER — Other Ambulatory Visit: Payer: Self-pay | Admitting: Family Medicine

## 2014-10-02 NOTE — Telephone Encounter (Signed)
Medication phoned to pharmacy.  

## 2014-10-02 NOTE — Telephone Encounter (Signed)
Electronic refill request.   Tramadol Last Filled:    120 tablet 1 Rf on 07/31/2014  Zolpidem Last Filled:    30 tablet 1 RF on 08/01/2014  Please advise.

## 2014-10-02 NOTE — Telephone Encounter (Signed)
Please call in.  Thanks.   

## 2014-10-05 ENCOUNTER — Ambulatory Visit (HOSPITAL_COMMUNITY): Payer: 59

## 2014-10-05 ENCOUNTER — Other Ambulatory Visit: Payer: Self-pay | Admitting: Urology

## 2014-10-05 ENCOUNTER — Encounter (HOSPITAL_COMMUNITY): Payer: Self-pay | Admitting: *Deleted

## 2014-10-05 ENCOUNTER — Ambulatory Visit (HOSPITAL_COMMUNITY)
Admission: AD | Admit: 2014-10-05 | Discharge: 2014-10-05 | Disposition: A | Discharge status: Home / Self Care | Payer: 59 | Source: Ambulatory Visit | Attending: Urology | Admitting: Urology

## 2014-10-05 ENCOUNTER — Encounter (HOSPITAL_COMMUNITY)
Admission: AD | Disposition: A | Discharge status: Home / Self Care | Payer: Self-pay | Source: Ambulatory Visit | Attending: Urology

## 2014-10-05 DIAGNOSIS — Z881 Allergy status to other antibiotic agents status: Secondary | ICD-10-CM | POA: Diagnosis not present

## 2014-10-05 DIAGNOSIS — F17210 Nicotine dependence, cigarettes, uncomplicated: Secondary | ICD-10-CM | POA: Insufficient documentation

## 2014-10-05 DIAGNOSIS — Z888 Allergy status to other drugs, medicaments and biological substances status: Secondary | ICD-10-CM | POA: Insufficient documentation

## 2014-10-05 DIAGNOSIS — Z90710 Acquired absence of both cervix and uterus: Secondary | ICD-10-CM | POA: Diagnosis not present

## 2014-10-05 DIAGNOSIS — N201 Calculus of ureter: Secondary | ICD-10-CM | POA: Insufficient documentation

## 2014-10-05 DIAGNOSIS — Z880 Allergy status to penicillin: Secondary | ICD-10-CM | POA: Diagnosis not present

## 2014-10-05 DIAGNOSIS — Z91040 Latex allergy status: Secondary | ICD-10-CM | POA: Insufficient documentation

## 2014-10-05 DIAGNOSIS — N200 Calculus of kidney: Secondary | ICD-10-CM

## 2014-10-05 DIAGNOSIS — Z9049 Acquired absence of other specified parts of digestive tract: Secondary | ICD-10-CM | POA: Diagnosis not present

## 2014-10-05 SURGERY — LITHOTRIPSY, ESWL
Anesthesia: LOCAL | Laterality: Left

## 2014-10-05 MED ORDER — DIAZEPAM 5 MG PO TABS
10.0000 mg | ORAL_TABLET | ORAL | Status: AC
  Administered 2014-10-05: 10 mg via ORAL
  Filled 2014-10-05: qty 2

## 2014-10-05 MED ORDER — DIPHENHYDRAMINE HCL 25 MG PO CAPS: 25.0000 mg | ORAL_CAPSULE | ORAL | Status: DC

## 2014-10-05 MED ORDER — DEXTROSE-NACL 5-0.45 % IV SOLN
INTRAVENOUS | Status: DC
  Administered 2014-10-05: 15:00:00 via INTRAVENOUS

## 2014-10-05 MED ORDER — CIPROFLOXACIN HCL 500 MG PO TABS
500.0000 mg | ORAL_TABLET | ORAL | Status: AC
  Administered 2014-10-05: 500 mg via ORAL
  Filled 2014-10-05: qty 1

## 2014-10-05 NOTE — H&P (Signed)
History of Present Illness [name] had a JJ stent inserted on 3/20 for a left proximal ureteral calculus and UTI. She complains of left sided abdominal pain. She does not have any more nausea or fever. Her temperature today is 98.6. Urinalysis shows 30 mgm protein, small leukocyte esterase, 11-20 WBCs, TNTC RBCs, few bacteria. KUB reveals   Surgical History Problems  1. History of Appendectomy 2. History of Cystoscopy With Insertion Of Ureteral Stent 3. History of Cystoscopy With Insertion Of Ureteral Stent Left 4. History of Hysterectomy 5. History of Lithotripsy 6. History of Lithotripsy  Current Meds 1. Ambien CR 12.5 MG Oral Tablet Extended Release;  Therapy: (Recorded:19Sep2013) to Recorded 2. Ditropan TABS;  Therapy: (Recorded:31Mar2016) to Recorded 3. EpiPen 0.3 MG/0.3ML DEVI;  Therapy: (Recorded:19Sep2013) to Recorded 4. Keflex TABS;  Therapy: (Recorded:31Mar2016) to Recorded 5. TiZANidine HCl CAPS;  Therapy: (Recorded:19Sep2013) to Recorded 6. Vicodin TABS;  Therapy: (Recorded:31Mar2016) to Recorded  Allergies Medication  1. Aspirin TABS 2. Augmentin TABS 3. Ibuprofen TABS 4. Penicillins Non-Medication  5. Latex  Family History Problems  1. Family history of Diabetes Mellitus : Maternal Grandfather 2. Family history of Family Health Status - Father's Age : Mother   90 3. Family history of Family Health Status - Mother's Age : Mother   28 4. Family history of Nephrolithiasis 5. Family history of Prostate Cancer : Brother 6. Family history of Thyroid Disorder : Maternal Grandmother  Social History Problems  1. Alcohol Use   occasionally 2. Marital History - Currently Married 3. Occupation:   Therapist, art 4. Tobacco use (Z72.0)   1/2 pkfor  15 yrs  Review of Systems Genitourinary, constitutional, skin, eye, otolaryngeal, hematologic/lymphatic, cardiovascular, pulmonary, endocrine, musculoskeletal, gastrointestinal, neurological and  psychiatric system(s) were reviewed and pertinent findings if present are noted and are otherwise negative.  Gastrointestinal: abdominal pain.    Vitals Vital Signs [Data Includes: Last 1 Day]  Recorded: [id number] 09:25AM  Blood Pressure: 108 / 76 Temperature: 98.6 F  Physical Exam Constitutional: Well nourished and well developed . No acute distress.  ENT:. The ears and nose are normal in appearance.  Neck: The appearance of the neck is normal and no neck mass is present.  Pulmonary: No respiratory distress and normal respiratory rhythm and effort.  Cardiovascular: Heart rate and rhythm are normal . No peripheral edema.  Abdomen: The abdomen is soft and nontender. No masses are palpated. No CVA tenderness. No hernias are palpable. No hepatosplenomegaly noted.  Genitourinary:  The bladder is non tender and not distended.  Lymphatics: The femoral and inguinal nodes are not enlarged or tender.  Skin: Normal skin turgor, no visible rash and no visible skin lesions.  Neuro/Psych:. Mood and affect are appropriate.    Results/Data Urine [Data Includes: Last 1 Day]   [id number]  COLOR YELLOW   APPEARANCE CLOUDY   SPECIFIC GRAVITY 1.020   pH 6.0   GLUCOSE NEG mg/dL  BILIRUBIN NEG   KETONE NEG mg/dL  BLOOD LARGE   PROTEIN 30 mg/dL  UROBILINOGEN 0.2 mg/dL  NITRITE NEG   LEUKOCYTE ESTERASE SMALL   SQUAMOUS EPITHELIAL/HPF RARE   WBC 11-20 WBC/hpf  RBC TNTC RBC/hpf  BACTERIA FEW   CRYSTALS NONE SEEN   CASTS NONE SEEN    KUB  INDICATION: Left renal stones  KUB shows normal bowel gas pattern. Psoas shadows are normal. The bony and soft tissues structures are unremarkable. There is a JJ stent in the left renal unit. There is a 5 mm stone in the  lower pole and a 5 mm stone in the upper pole of the left kidney. There are no calcifications along the course of the left stent nor in the right kidney or ureter.  IMPRESSION: Left JJ stent. Left renal calculi.   Assessment Assessed  1.  Kidney stone on left side (N20.0) 2. Urinary tract infection (N39.0)  Plan  Health Maintenance  1. UA With REFLEX; [Do Not Release]; Status:Complete;   Done: [id number] 08:20AM Kidney stone on left side  2. Start: HYDROmorphone HCl - 4 MG Oral Tablet; TAKE 1 TABLET EVERY 4 HOURS AS  NEEDED FOR PAIN 3. CMP with Estimated GFR; Status:In Progress - Specimen/Data Collected;   Done:  [id number] 4. KUB; Status:Resulted - Requires Verification;   Done: [id number] 12:00AM 5. PTH INTACT WITHOUT CALCIUM; Status:In Progress - Specimen/Data Collected;    Done: [id number] 6. URIC ACID; Status:In Progress - Specimen/Data Collected;   Done: [id number] 7. VENIPUNCTURE; Status:Complete;   Done: 34KBT2481  Urine culture. ESL left renal stone. The risks, benefits of the procedure were discussed with the patient. The risks include but are not limited to hemorrhage, infection, renal or perirenal hematoma, injury to adjacent organs, inability to fragment the stone, steinstrasse. She understands and is agreeable.

## 2014-10-05 NOTE — Op Note (Signed)
Refer to [facility] Op Note scanned in the chart 

## 2014-11-07 ENCOUNTER — Other Ambulatory Visit: Payer: Self-pay | Admitting: Family Medicine

## 2014-11-07 NOTE — Telephone Encounter (Signed)
Last office visit 08/15/2014.  Last refilled 09/13/2014 for #120 with no refills.  Ok to refill?

## 2014-11-08 MED ORDER — METHYLPHENIDATE HCL 5 MG PO TABS
5.0000 mg | ORAL_TABLET | Freq: Two times a day (BID) | ORAL | Status: DC
Start: 2014-11-08 — End: 2014-12-26

## 2014-11-08 NOTE — Telephone Encounter (Signed)
Left message on voice mail  to call back

## 2014-11-08 NOTE — Telephone Encounter (Signed)
Printed.  Thanks.  

## 2014-11-08 NOTE — Telephone Encounter (Signed)
Patient notified by telephone that script is up front ready for pickup. 

## 2014-11-28 ENCOUNTER — Ambulatory Visit (INDEPENDENT_AMBULATORY_CARE_PROVIDER_SITE_OTHER): Payer: 59 | Admitting: Family Medicine

## 2014-11-28 ENCOUNTER — Encounter: Payer: Self-pay | Admitting: Family Medicine

## 2014-11-28 VITALS — BP 108/76 | HR 76 | Temp 99.3°F | Wt 208.0 lb

## 2014-11-28 DIAGNOSIS — R21 Rash and other nonspecific skin eruption: Secondary | ICD-10-CM

## 2014-11-28 MED ORDER — PREDNISONE 20 MG PO TABS: ORAL_TABLET | ORAL | Status: DC

## 2014-11-28 NOTE — Progress Notes (Signed)
Pre visit review using our clinic review tool, if applicable. No additional management support is needed unless otherwise documented below in the visit note.  She got a new face lotion.  No trouble initially, but then the next day had facial rash.  "Like I had gotten a sunburn."  Only use the lotion on her face but now with rash on face, arms, torso.  She can swallow well.  No lip or tongue swelling.  She has taken prednisone prev.  No FCNAVD.  No wheeze.  She got rid of the lotion.  She has her epipen to use if needed.  No other trigger known.  Itching in spite of benadryl.   Meds, vitals, and allergies reviewed.   ROS: See HPI.  Otherwise, noncontributory.  nad ncat Mmm OP wnl, no lip or tongue swelling Neck supple, no LA No stridor rrr ctab Abd soft Ext well perfused Diffuse red blanching maculopapular rash on the arms upper torso and face B

## 2014-11-28 NOTE — Patient Instructions (Addendum)
Keep taking benadryl, up to 50mg  every 6 hours.  Sedation caution.  Start pred taper, take with food.  Update me as needed.  Take care.  Glad to see you.

## 2014-11-29 DIAGNOSIS — R21 Rash and other nonspecific skin eruption: Secondary | ICD-10-CM | POA: Insufficient documentation

## 2014-11-29 NOTE — Assessment & Plan Note (Signed)
Likely reaction to the lotion, d/w pt.  Given diffuse skin changes and lack of effect with benadryl, would use steroid taper with routine cautions.  She agrees. Okay for outpatient f/u.

## 2014-11-30 ENCOUNTER — Other Ambulatory Visit: Payer: Self-pay | Admitting: Family Medicine

## 2014-12-01 NOTE — Telephone Encounter (Signed)
Please call in both.  Thanks.

## 2014-12-01 NOTE — Telephone Encounter (Signed)
Prescription called to pharmacy.

## 2014-12-01 NOTE — Telephone Encounter (Signed)
Both prescriptions last filled 10/02/14 x1 refill.

## 2014-12-14 ENCOUNTER — Other Ambulatory Visit: Payer: Self-pay | Admitting: Family Medicine

## 2014-12-14 NOTE — Telephone Encounter (Signed)
Electronic refill request. Last Filled:    60 tablet 3 RF on 08/15/2014  Please advise.

## 2014-12-14 NOTE — Telephone Encounter (Signed)
Sent. Thanks.   

## 2014-12-26 ENCOUNTER — Other Ambulatory Visit: Payer: Self-pay | Admitting: Family Medicine

## 2014-12-26 NOTE — Telephone Encounter (Signed)
MyChart refill request.  Last Filled:    120 tablet 0 RF on 11/08/2014  Please advise.

## 2014-12-27 MED ORDER — METHYLPHENIDATE HCL 5 MG PO TABS
5.0000 mg | ORAL_TABLET | Freq: Two times a day (BID) | ORAL | Status: DC
Start: 2014-12-27 — End: 2015-02-05

## 2014-12-27 NOTE — Telephone Encounter (Signed)
Patient notified by telephone that script is up front ready for pickup. 

## 2014-12-27 NOTE — Telephone Encounter (Signed)
Printed.  Thanks.  

## 2015-01-24 ENCOUNTER — Other Ambulatory Visit: Payer: Self-pay | Admitting: Family Medicine

## 2015-01-24 NOTE — Telephone Encounter (Signed)
Rx called to pharmacy as instructed. 

## 2015-01-24 NOTE — Telephone Encounter (Signed)
Received refill request electronically  Last refill Zolpidem 12/01/14 #30/1; Tramadol #120/1 same date Last office visit 11/28/14/acute Is it okay to refill medication?

## 2015-01-24 NOTE — Telephone Encounter (Signed)
Please call in.  Thanks.   

## 2015-01-31 ENCOUNTER — Encounter: Payer: Self-pay | Admitting: Family Medicine

## 2015-02-05 ENCOUNTER — Other Ambulatory Visit: Payer: Self-pay | Admitting: *Deleted

## 2015-02-05 ENCOUNTER — Telehealth: Payer: Self-pay | Admitting: Family Medicine

## 2015-02-05 MED ORDER — METHYLPHENIDATE HCL 5 MG PO TABS: 5.0000 mg | ORAL_TABLET | Freq: Two times a day (BID) | ORAL | Status: DC

## 2015-02-05 NOTE — Telephone Encounter (Signed)
PA for Zolpidem and Methylphenidate approved.  Copy sent for scanning and patient advised.

## 2015-02-05 NOTE — Telephone Encounter (Signed)
Patient phoned in for paper Rx for Methylphenidate.  Call patient when ready for pick up.

## 2015-02-05 NOTE — Telephone Encounter (Signed)
Pt returned call, she needs to have paper rx for generic ritalin.   Call back number is [phone]

## 2015-02-05 NOTE — Telephone Encounter (Signed)
Printed.  Thanks.  

## 2015-02-06 NOTE — Telephone Encounter (Signed)
Patient advised.  Rx left at front desk for pick up. 

## 2015-03-11 ENCOUNTER — Other Ambulatory Visit: Payer: Self-pay | Admitting: Family Medicine

## 2015-03-13 ENCOUNTER — Other Ambulatory Visit: Payer: Self-pay | Admitting: Family Medicine

## 2015-03-13 MED ORDER — METHYLPHENIDATE HCL 5 MG PO TABS
5.0000 mg | ORAL_TABLET | Freq: Two times a day (BID) | ORAL | Status: DC
Start: 2015-03-13 — End: 2015-04-23

## 2015-03-13 MED ORDER — METHYLPHENIDATE HCL 5 MG PO TABS: 5.0000 mg | ORAL_TABLET | Freq: Two times a day (BID) | ORAL | Status: DC

## 2015-03-13 MED ORDER — METHYLPHENIDATE HCL 5 MG PO TABS
5.0000 mg | ORAL_TABLET | Freq: Two times a day (BID) | ORAL | Status: DC
Start: 2015-03-13 — End: 2015-08-29

## 2015-03-13 NOTE — Telephone Encounter (Signed)
Sent. Thanks.   

## 2015-03-13 NOTE — Telephone Encounter (Signed)
She is on stable dose.  rx printed for 3 x 1 month supply.   Thanks.

## 2015-03-13 NOTE — Telephone Encounter (Signed)
Received refill request electronically from pharmacy Last refill 12/14/14 #60/3 Last office visit 11/28/14 Is it okay to refill?

## 2015-03-13 NOTE — Telephone Encounter (Signed)
Patient notified by telephone that script is up front ready for pickup. 

## 2015-03-16 ENCOUNTER — Other Ambulatory Visit: Payer: Self-pay | Admitting: Family Medicine

## 2015-03-16 NOTE — Telephone Encounter (Signed)
Please call in.  Thanks.   

## 2015-03-16 NOTE — Telephone Encounter (Signed)
Last filled 01/24/15 with 1 refill--please advise

## 2015-03-28 ENCOUNTER — Other Ambulatory Visit: Payer: Self-pay | Admitting: Family Medicine

## 2015-03-28 NOTE — Telephone Encounter (Signed)
Please call in.  Thanks.   

## 2015-03-28 NOTE — Telephone Encounter (Signed)
Ok to refill? Last filled 01/24/15 and last OV 11/28/14.

## 2015-03-28 NOTE — Telephone Encounter (Signed)
Rx called to pharmacy as instructed. 

## 2015-04-03 ENCOUNTER — Other Ambulatory Visit: Payer: Self-pay | Admitting: Family Medicine

## 2015-04-04 NOTE — Telephone Encounter (Signed)
Rx called into [facility]

## 2015-04-23 ENCOUNTER — Other Ambulatory Visit: Payer: Self-pay | Admitting: Family Medicine

## 2015-04-24 MED ORDER — METHYLPHENIDATE HCL 5 MG PO TABS: 5.0000 mg | ORAL_TABLET | Freq: Two times a day (BID) | ORAL | Status: DC

## 2015-04-24 NOTE — Telephone Encounter (Signed)
Sent patient message back thru my-chart, that prescription is ready for pick-up and will be at the front desk.  

## 2015-04-24 NOTE — Telephone Encounter (Signed)
Printed.  Thanks.  

## 2015-04-25 ENCOUNTER — Encounter: Payer: Self-pay | Admitting: Family Medicine

## 2015-04-25 ENCOUNTER — Other Ambulatory Visit: Payer: Self-pay | Admitting: Family Medicine

## 2015-04-25 NOTE — Telephone Encounter (Signed)
Refill received electronically Tramadol was refilled 03/28/15 #120/1 refill Called and spoke to Richardson Landry Warehouse manager) at pharmacy and was advised that whoever took the refill on 03/28/15 recorded no refills. Richardson Landry stated that he will give the refill that was called earlier and they recorded it wrong.

## 2015-04-26 NOTE — Telephone Encounter (Signed)
Noted. Thanks.

## 2015-05-11 ENCOUNTER — Encounter: Payer: Self-pay | Admitting: Family Medicine

## 2015-06-01 ENCOUNTER — Other Ambulatory Visit: Payer: Self-pay | Admitting: Family Medicine

## 2015-06-03 NOTE — Telephone Encounter (Signed)
Please call in.  Thanks.   

## 2015-06-04 NOTE — Telephone Encounter (Signed)
Rx called to pharmacy as instructed. 

## 2015-08-02 ENCOUNTER — Other Ambulatory Visit: Payer: Self-pay | Admitting: Family Medicine

## 2015-08-02 NOTE — Telephone Encounter (Signed)
Electronic refill request. Last Filled:   Tramadol  120 tablet 1 06/03/2015  Zolpidem Last Filled:    30 tablet 1 06/03/2015  Tizanidine Last Filled:    60 tablet 3 03/13/2015  Last office visit:   11/28/14 (acute OV).  Please advise.

## 2015-08-03 NOTE — Telephone Encounter (Signed)
Please call in all 3.  Due for CPE spring 2017.  Thanks.

## 2015-08-03 NOTE — Telephone Encounter (Signed)
Medications phoned to pharmacy.  Left detailed message on voicemail of patient to schedule CPE.

## 2015-08-06 ENCOUNTER — Ambulatory Visit (INDEPENDENT_AMBULATORY_CARE_PROVIDER_SITE_OTHER): Payer: 59 | Admitting: Primary Care

## 2015-08-06 ENCOUNTER — Encounter: Payer: Self-pay | Admitting: Primary Care

## 2015-08-06 ENCOUNTER — Ambulatory Visit (INDEPENDENT_AMBULATORY_CARE_PROVIDER_SITE_OTHER)
Admission: RE | Admit: 2015-08-06 | Discharge: 2015-08-06 | Disposition: A | Discharge status: Home / Self Care | Payer: 59 | Source: Ambulatory Visit | Attending: Primary Care | Admitting: Primary Care

## 2015-08-06 VITALS — BP 124/86 | HR 119 | Temp 97.9°F | Wt 218.4 lb

## 2015-08-06 DIAGNOSIS — M25512 Pain in left shoulder: Secondary | ICD-10-CM

## 2015-08-06 MED ORDER — PREDNISONE 10 MG PO TABS: ORAL_TABLET | ORAL | Status: DC

## 2015-08-06 NOTE — Progress Notes (Signed)
Subjective:    Patient ID: [name], female    DOB: [birth year], 35 y.o.   MRN: [id number]  HPI  [name] is a 35 year old female who presents today with a chief complaint of shoulder pain. Her pain is located to her left shoulder (near joint) and left upper back, and has been present intermittently since just after Thanksgiving 2016. She's been applying ice/heat with some improvement. She takes Tizanidine and tramadol with temporary improvement. She does have numbness/tingling to her left upper extremity that has been present for the past week. Denies neck pain, recent injury or trauma.   Review of Systems  Musculoskeletal: Positive for myalgias and arthralgias.  Neurological: Positive for numbness.       Past Medical History  Diagnosis Date  . GLUCOSE INTOLERANCE 03/19/2009  . COMMON MIGRAINE 03/19/2009  . ALLERGIC RHINITIS 10/11/2009  . HYDRONEPHROSIS, RIGHT 05/11/2009  . RENAL CALCULUS, RIGHT 05/11/2009  . ANKLE PAIN, RIGHT 04/03/2009  . BACK PAIN 03/19/2009  . INSOMNIA-SLEEP DISORDER-UNSPEC 03/19/2009  . FLANK PAIN, RIGHT 05/11/2009  . NEPHROLITHIASIS, HX OF 03/19/2009  . Allergy to latex 04/11/2010  . ADD (attention deficit disorder)     testing done 2016    Social History   Social History  . Marital Status: Married    Spouse Name: N/A  . Number of Children: 0  . Years of Education: N/A   Occupational History  . customer service [employer] History Main Topics  . Smoking status: Former Smoker    Types: Cigarettes  . Smokeless tobacco: Never Used     Comment: quit with chantix 2010  . Alcohol Use: 0.0 oz/week    0 Standard drinks or equivalent per week     Comment: rare  . Drug Use: No  . Sexual Activity: Yes   Other Topics Concern  . Not on file   Social History Narrative   Married to [employer]   Enjoys reading   No kids    Past Surgical History  Procedure Laterality Date  . Abdominal hysterectomy    .  Appendectomy  1994  . Oophorectomy  07/2008  . S/p left knee arthroscopic  1994  . Hx of etopic pregnancy  2005  . Lithotripsy  2011  . Eye surgery Bilateral     lens replacement 2014  . Cystoscopy w/ ureteral stent placement Left 09/24/2014    Procedure: CYSTOSCOPY WITH RETROGRADE PYELOGRAM/LEFT URETERAL STENT PLACEMENT;  Surgeon: Raynelle Bring, MD;  Location: [facility];  Service: Urology;  Laterality: Left;    Family History  Problem Relation Age of Onset  . Alcohol abuse Mother     ETOH  . Bipolar disorder Mother     manic depressive/bipolar  . Thyroid disease Mother     Low thyroid  . Hypertension Father   . Hypertension Other   . Diabetes Other   . Heart disease Other   . Stroke Other   . Cancer Other     Lung Cancer    Allergies  Allergen Reactions  . Latex     Carries epipen  . Amoxicillin-Pot Clavulanate   . Aspirin   . Ibuprofen     Patient says that she can tolerate Ketorolac.  . Nsaids     Patient says that she can tolerate ketorolac.  . Other Other (See Comments)    Skin lotion- skin allergy  . Penicillins   . Red Dye  rash    Current Outpatient Prescriptions on File Prior to Visit  Medication Sig Dispense Refill  . diphenhydrAMINE (BENADRYL) 25 MG tablet Take 25 mg by mouth every 6 (six) hours as needed.    . methylphenidate (RITALIN) 5 MG tablet Take 1-2 tablets (5-10 mg total) by mouth 2 (two) times daily. Fill on/after 04/12/15 120 tablet 0  . tiZANidine (ZANAFLEX) 4 MG tablet TAKE 1 TABLET EVERY 6 HOURS AS NEEDED 60 tablet 3  . traMADol (ULTRAM) 50 MG tablet TAKE 1 TABLET EVERY 6 HOURS AS NEEDED 120 tablet 1  . zolpidem (AMBIEN CR) 12.5 MG CR tablet TAKE 1 TABLET BY MOUTH AT BEDTIME 30 tablet 1  . EPINEPHrine (EPI-PEN) 0.3 mg/0.3 mL SOAJ injection Inject 0.3 mLs (0.3 mg total) into the muscle once. (Patient not taking: Reported on 08/06/2015) 1 Device 0   No current facility-administered medications on file prior to visit.    BP 124/86 mmHg   Pulse 119  Temp(Src) 97.9 F (36.6 C) (Oral)  Wt 218 lb 6.4 oz (99.066 kg)  SpO2 97%    Objective:   Physical Exam  Constitutional: She appears well-nourished.  Cardiovascular: Normal rate and regular rhythm.   Pulmonary/Chest: Effort normal and breath sounds normal.  Musculoskeletal:       Left shoulder: She exhibits decreased range of motion and pain. She exhibits no tenderness, no swelling, no crepitus and no deformity.  Decrease ROM due to pain to abduction and placing arm behind back. Intermittent numbness. Pain appears to be originating to left capsule encasing joint.  Skin: Skin is warm and dry.          Assessment & Plan:  Shoulder pain:  Located to left shoulder, in shoulder joint, intermittently since Thanksgiving. No heavy lifting, injury, trauma. Sounds like adhesive capsulitis as she has pain to joint with abduction. Could possibly be ligamental or muscular tear.  Will treat with prednisone taper for numbness/tingling. Heat/ice. Stretching exercises. Xray negative. If pain persists, will consider sending to physical therapy. Maybe require MRI. She is to notify us if no improvement.

## 2015-08-06 NOTE — Patient Instructions (Signed)
Start prednisone tablets to help reduce inflammation around shoulder capsule. Take 3 tablets for 2 days, then 2 tablets for 2 days, then 1 tablet for 2 days.  Complete xray(s) prior to leaving today. I will notify you of your results once received.  Continue heat/ice. Please call me if no improvement as we can send you to physical therapy for further evaluation.  It was a pleasure meeting you!

## 2015-08-29 ENCOUNTER — Encounter: Payer: Self-pay | Admitting: Family Medicine

## 2015-08-29 ENCOUNTER — Ambulatory Visit (INDEPENDENT_AMBULATORY_CARE_PROVIDER_SITE_OTHER): Payer: 59 | Admitting: Family Medicine

## 2015-08-29 VITALS — BP 114/76 | HR 92 | Temp 98.7°F | Ht 68.25 in | Wt 219.8 lb

## 2015-08-29 DIAGNOSIS — R635 Abnormal weight gain: Secondary | ICD-10-CM

## 2015-08-29 DIAGNOSIS — R6889 Other general symptoms and signs: Secondary | ICD-10-CM | POA: Diagnosis not present

## 2015-08-29 DIAGNOSIS — M549 Dorsalgia, unspecified: Secondary | ICD-10-CM

## 2015-08-29 DIAGNOSIS — Z0001 Encounter for general adult medical examination with abnormal findings: Secondary | ICD-10-CM | POA: Diagnosis not present

## 2015-08-29 DIAGNOSIS — F988 Other specified behavioral and emotional disorders with onset usually occurring in childhood and adolescence: Secondary | ICD-10-CM

## 2015-08-29 DIAGNOSIS — M25512 Pain in left shoulder: Secondary | ICD-10-CM

## 2015-08-29 DIAGNOSIS — G4700 Insomnia, unspecified: Secondary | ICD-10-CM

## 2015-08-29 DIAGNOSIS — G8929 Other chronic pain: Secondary | ICD-10-CM

## 2015-08-29 DIAGNOSIS — Z87442 Personal history of urinary calculi: Secondary | ICD-10-CM

## 2015-08-29 MED ORDER — METHYLPHENIDATE HCL 5 MG PO TABS: 10.0000 mg | ORAL_TABLET | Freq: Two times a day (BID) | ORAL | Status: DC

## 2015-08-29 MED ORDER — EPINEPHRINE 0.3 MG/0.3ML IJ SOAJ: 0.3000 mg | Freq: Once | INTRAMUSCULAR | Status: DC

## 2015-08-29 NOTE — Patient Instructions (Signed)
Ask about getting an egg free flu shot at the pharmacy.  Try the higher dose of ritalin and update me.   We may be able to change you to the extended 30mg  tab, once a day.  Use the shoulder exercises in the meantime.  Schedule fasting labs when possible.   Take care.  Glad to see you.

## 2015-08-29 NOTE — Progress Notes (Signed)
Pre visit review using our clinic review tool, if applicable. No additional management support is needed unless otherwise documented below in the visit note.  CPE- See plan.  Routine anticipatory guidance given to patient.  See health maintenance. Tetanus 2015 Flu- she has an egg allergy on testing prev.   PNA and shingles not due.  Mammogram not due.   Pap not indicated.   S/p hysterectomy.  TAH and BSO.  Less night sweats and hot flashes than prev but both still present.  She can tolerate the hot flashes.   Consider DXA ~age 70.   Living will d/w pt.  Husband designated if patient were incapacitated.  Diet and exercise d/w pt.  Encouraged both.  "I've been struggling with it."  Work is complicating her schedule.   Pt d/w pt re: HIV screening.  Prev done 2005.    No dysphagia.  Others have asked her about local neck swelling, re possible thyroid enlargement.  Some fatigue.  She has totalled her calories, with restriction.    H/o renal stones.  None recently.    ADD.  No ADE on med.  Effect of med has tabled off.  The effect is blunted and the duration is shorter.  She can tell with attention to computer work, at her job.  Still doing notes/lists, with some help.  She has longstanding sx overall.  She doesn't use med on the weekend.    H/o chronic L SI and lower back pain, has seen ortho. Burning in the area, but not down the posterior leg as with sciatica.  No surgery planned, likely just medical mgmt. No ADEs with current meds. Is stretching. Overall less painful than in years past, but with occ flares of worse sx.  No weakness.    Insomnia.  No ADE on med. No parasomnias.  Sleeping fairly well with med.    L shoulder pain.  Some better but not resolved fully with pred course.    PMH and SH reviewed  Meds, vitals, and allergies reviewed.   ROS: See HPI.  Otherwise negative.    GEN: nad, alert and oriented HEENT: mucous membranes moist NECK: supple w/o LA but fullness noted in  the neck on inspection, though I don't appreciate any nodularity or tenderness.   CV: rrr. PULM: ctab, no inc wob ABD: soft, +bs EXT: no edema SKIN: no acute rash  L shoulder pain with int rotation.  + scap assist.  No arm drop.  Normal grip.   L SI tender on testing but SLR neg.  Back not ttp in midline

## 2015-08-30 DIAGNOSIS — M25519 Pain in unspecified shoulder: Secondary | ICD-10-CM | POA: Insufficient documentation

## 2015-08-30 DIAGNOSIS — Z0000 Encounter for general adult medical examination without abnormal findings: Secondary | ICD-10-CM | POA: Insufficient documentation

## 2015-08-30 DIAGNOSIS — Z0001 Encounter for general adult medical examination with abnormal findings: Secondary | ICD-10-CM | POA: Insufficient documentation

## 2015-08-30 NOTE — Assessment & Plan Note (Signed)
Likely cuff sx.  D/w pt.  Handout given re: home exercise and that should help.  Update me as needed. She agrees.

## 2015-08-30 NOTE — Assessment & Plan Note (Signed)
No ADE on med. No parasomnias. Sleeping fairly well with med. Continue as is

## 2015-08-30 NOTE — Assessment & Plan Note (Addendum)
Tetanus 2015 Flu- she has an egg allergy on testing prev.   PNA and shingles not due.  Mammogram not due.   Pap not indicated.   S/p hysterectomy.  TAH and BSO.  Less night sweats and hot flashes than prev but both still present.  She can tolerate the hot flashes.   Consider DXA ~age 35.   Living will d/w pt.  Husband designated if patient were incapacitated.  Diet and exercise d/w pt.  Encouraged both.  "I've been struggling with it."  Work is complicating her schedule.   Pt d/w pt re: HIV screening.  Prev done 2005.   I'm not sure about the significance of the neck fullness on exam. I don't feel specific TMG.  Will check TSH with f/u labs.

## 2015-08-30 NOTE — Assessment & Plan Note (Signed)
Inc to 15mg  BID ritalin, we may need to change to 30mg  XL dosing.  D/w pt.  She agrees.

## 2015-08-30 NOTE — Assessment & Plan Note (Signed)
Continue meds and stretching as if for chronic SI pain.  No ADE on meds.  She agrees.

## 2015-10-02 ENCOUNTER — Other Ambulatory Visit: Payer: Self-pay | Admitting: Family Medicine

## 2015-10-02 NOTE — Telephone Encounter (Signed)
Electronic refill request. Last Filled:   Tramadol  #120  1 RF on 08/03/15 Last Filled:   Zolpidem  #30  1RF on 08/03/15   Last office visit:   08/29/15  Please advise.

## 2015-10-03 ENCOUNTER — Other Ambulatory Visit: Payer: Self-pay | Admitting: Family Medicine

## 2015-10-03 NOTE — Telephone Encounter (Signed)
Refills called to pharmacy as instructed. 

## 2015-10-03 NOTE — Telephone Encounter (Signed)
Please call in.  Thanks.   

## 2015-10-04 ENCOUNTER — Encounter: Payer: Self-pay | Admitting: *Deleted

## 2015-10-04 ENCOUNTER — Other Ambulatory Visit (INDEPENDENT_AMBULATORY_CARE_PROVIDER_SITE_OTHER): Payer: 59

## 2015-10-04 DIAGNOSIS — R635 Abnormal weight gain: Secondary | ICD-10-CM | POA: Diagnosis not present

## 2015-10-04 DIAGNOSIS — Z87442 Personal history of urinary calculi: Secondary | ICD-10-CM

## 2015-10-04 LAB — COMPREHENSIVE METABOLIC PANEL
ALK PHOS: 88 U/L (ref 39–117)
ALT: 27 U/L (ref 0–35)
AST: 26 U/L (ref 0–37)
Albumin: 4.4 g/dL (ref 3.5–5.2)
BUN: 25 mg/dL — ABNORMAL HIGH (ref 6–23)
CO2: 27 meq/L (ref 19–32)
Calcium: 9.7 mg/dL (ref 8.4–10.5)
Chloride: 105 mEq/L (ref 96–112)
Creatinine, Ser: 0.96 mg/dL (ref 0.40–1.20)
GFR: 70.01 mL/min (ref >60.00)
GLUCOSE: 92 mg/dL (ref 70–99)
POTASSIUM: 4 meq/L (ref 3.5–5.1)
Sodium: 139 mEq/L (ref 135–145)
Total Bilirubin: 0.4 mg/dL (ref 0.2–1.2)
Total Protein: 7.6 g/dL (ref 6.0–8.3)

## 2015-10-04 LAB — TSH: TSH: 2.87 u[IU]/mL (ref 0.35–4.50)

## 2015-10-05 ENCOUNTER — Telehealth: Payer: Self-pay | Admitting: *Deleted

## 2015-10-05 NOTE — Telephone Encounter (Signed)
Patient advised.

## 2015-10-05 NOTE — Telephone Encounter (Signed)
Minimal inc, esp with normal Cr.  I would only drink more fluids in the meantime.   Thanks.

## 2015-10-05 NOTE — Telephone Encounter (Signed)
Spoke to pt who states that she noticed her BUN labs were elevated. She states that normally, those labs are only increased if she has, or is developing a kidney stone. Pt is requesting a cb before end of day with any advice

## 2015-10-15 ENCOUNTER — Other Ambulatory Visit: Payer: Self-pay | Admitting: Family Medicine

## 2015-10-15 NOTE — Telephone Encounter (Signed)
MyChart message requesting refill of Methylphenidate.  Last Filled:    180 tablet 0 08/29/2015  Please advise.

## 2015-10-16 NOTE — Telephone Encounter (Signed)
Okay to do 3 month supply, with 3 rxs.  If needed to pick up today, then please ask another doc to sign this.  O/w, I'll sign on Wednesday.  Thanks.

## 2015-10-17 MED ORDER — METHYLPHENIDATE HCL 5 MG PO TABS: 10.0000 mg | ORAL_TABLET | Freq: Two times a day (BID) | ORAL | Status: DC

## 2015-10-17 NOTE — Telephone Encounter (Signed)
Rx's printed and placed in Dr. Josefine Class in box for signature.

## 2015-10-17 NOTE — Telephone Encounter (Signed)
Patient notified by telephone that scripts are up front ready for pickup.

## 2015-10-17 NOTE — Telephone Encounter (Signed)
Signed, thanks

## 2015-11-23 ENCOUNTER — Encounter: Payer: Self-pay | Admitting: Family Medicine

## 2015-11-23 ENCOUNTER — Ambulatory Visit (INDEPENDENT_AMBULATORY_CARE_PROVIDER_SITE_OTHER): Payer: 59 | Admitting: Family Medicine

## 2015-11-23 ENCOUNTER — Ambulatory Visit (INDEPENDENT_AMBULATORY_CARE_PROVIDER_SITE_OTHER)
Admission: RE | Admit: 2015-11-23 | Discharge: 2015-11-23 | Disposition: A | Discharge status: Home / Self Care | Payer: 59 | Source: Ambulatory Visit | Attending: Family Medicine | Admitting: Family Medicine

## 2015-11-23 ENCOUNTER — Telehealth: Payer: Self-pay | Admitting: Family Medicine

## 2015-11-23 VITALS — BP 106/74 | HR 78 | Temp 97.2°F | Ht 68.25 in | Wt 209.4 lb

## 2015-11-23 DIAGNOSIS — N200 Calculus of kidney: Secondary | ICD-10-CM | POA: Diagnosis not present

## 2015-11-23 DIAGNOSIS — R300 Dysuria: Secondary | ICD-10-CM

## 2015-11-23 DIAGNOSIS — R109 Unspecified abdominal pain: Secondary | ICD-10-CM

## 2015-11-23 DIAGNOSIS — R10A0 Flank pain, unspecified side: Secondary | ICD-10-CM

## 2015-11-23 LAB — URINALYSIS, ROUTINE W REFLEX MICROSCOPIC
BILIRUBIN URINE: NEGATIVE
KETONES UR: NEGATIVE
NITRITE: NEGATIVE
RBC / HPF: NONE SEEN (ref 0–?)
SPECIFIC GRAVITY, URINE: 1.015 (ref 1.000–1.030)
Total Protein, Urine: NEGATIVE
URINE GLUCOSE: NEGATIVE
Urobilinogen, UA: 0.2 (ref 0.0–1.0)
pH: 7.5 (ref 5.0–8.0)

## 2015-11-23 LAB — POCT URINALYSIS DIPSTICK
BILIRUBIN UA: NEGATIVE
GLUCOSE UA: NEGATIVE
KETONES UA: NEGATIVE
Nitrite, UA: NEGATIVE
PROTEIN UA: NEGATIVE
Spec Grav, UA: 1.02
Urobilinogen, UA: 0.2
pH, UA: 7

## 2015-11-23 MED ORDER — CIPROFLOXACIN HCL 250 MG PO TABS: 250.0000 mg | ORAL_TABLET | Freq: Two times a day (BID) | ORAL | Status: DC

## 2015-11-23 MED ORDER — TAMSULOSIN HCL 0.4 MG PO CAPS: 0.4000 mg | ORAL_CAPSULE | Freq: Every day | ORAL | Status: DC

## 2015-11-23 NOTE — Patient Instructions (Signed)
Nice to meet you. The concern with her symptoms would be for a UTI or kidney infection or kidney stone. We will start you on ciprofloxacin to cover for possible kidney infection or UTI with stone in place. We will start you on Flomax to help you pass the stone. You should stay well hydrated. She can use tramadol for discomfort. You should go get the x-ray of your abdomen to evaluate for stone. If your symptoms become worse or your pain uncontrollable redeveloped abdominal pain, fevers, nausea, vomiting, or any new or changing symptoms please seek medical attention.

## 2015-11-23 NOTE — Progress Notes (Signed)
Pre visit review using our clinic review tool, if applicable. No additional management support is needed unless otherwise documented below in the visit note. 

## 2015-11-23 NOTE — Telephone Encounter (Signed)
Please advise 

## 2015-11-23 NOTE — Progress Notes (Signed)
Patient ID: [name], female   DOB: [birth year], 35 y.o.   MRN: [id number]  Tommi Rumps, MD Phone: [phone]  [name] is a 35 y.o. female who presents today for same-day visit.  Patient notes mild intermittent low back pain over the last week. Notes over the last 1-2 days back pain has increased in frequency and intensity. Is localized to the left flank. Had colic-like symptoms last night. Had some sweating and nausea with it. It is better this morning. Has some burning with urination. Some urgency. No frequency. No fevers. No vaginal itching or discharge. Urine does have an odor. This feels similar to her prior history of kidney stones. She does report in the past she has waited too long and ended up having infection and getting hospitalized for this. Has a history of 6+ lithotripsies in the past. No numbness or weakness in her lower extremities. No loss of bowel or bladder function, saddle anesthesia, fevers, or history of cancer. Extreme at all for pain.  PMH: Former smoker   ROS see history of present illness  Objective  Physical Exam Filed Vitals:   11/23/15 1355  BP: 106/74  Pulse: 78  Temp: 97.2 F (36.2 C)    BP Readings from Last 3 Encounters:  11/23/15 106/74  08/29/15 114/76  08/06/15 124/86   Wt Readings from Last 3 Encounters:  11/23/15 209 lb 6.4 oz (94.983 kg)  08/29/15 219 lb 12 oz (99.678 kg)  08/06/15 218 lb 6.4 oz (99.066 kg)    Physical Exam  Constitutional: She is well-developed, well-nourished, and in no distress.  HENT:  Head: Normocephalic and atraumatic.  Right Ear: External ear normal.  Left Ear: External ear normal.  Cardiovascular: Normal rate, regular rhythm and normal heart sounds.   Pulmonary/Chest: Effort normal and breath sounds normal.  Abdominal: Soft. Bowel sounds are normal. She exhibits no distension. There is no tenderness. There is no rebound and no guarding.  Mild left CVA tenderness, no right CVA tenderness    Musculoskeletal:  No midline spine tenderness, no midline spine step-off, no muscular back tenderness  Neurological:  5 out of 5 strength bilateral quads, hamstrings, plantar flexion, and dorsiflexion, sensation light touch intact bilaterally friction wheeze, 2+ patellar reflexes  Skin: Skin is warm and dry. She is not diaphoretic.     Assessment/Plan: Please see individual problem list.  Nephrolithiasis Patient's symptoms and history likely consistent with nephrolithiasis versus UTI versus early pyelonephritis. Benign abdominal exam. Benign lower extremity neurological exam. UA does have blood and leukocytes on it. Given concern for nephrolithiasis we will treat with Flomax and pain control with tramadol that she already has. We will send her urine for culture. Given concern for infection and CVA tenderness and history of stone we will cover with ciprofloxacin for possible infection. We will obtain a KUB to evaluate for stones. If unable to pass in the next week or if she does indeed have a urinary tract infection she will see her urologist. If unable to gain pain control at home she'll seek medical attention. She is given return precautions.    Orders Placed This Encounter  Procedures  . Urine Culture  . DG Abd 1 View    Standing Status: Future     Number of Occurrences: 1     Standing Expiration Date: 01/22/2017    Order Specific Question:  Reason for Exam (SYMPTOM  OR DIAGNOSIS REQUIRED)    Answer:  flank pain, history of kidney stone  Order Specific Question:  Is the patient pregnant?    Answer:  No    Order Specific Question:  Preferred imaging location?    Answer:  [facility]  . Urinalysis, Routine w reflex microscopic  . POCT Urinalysis Dipstick    Meds ordered this encounter  Medications  . tamsulosin (FLOMAX) 0.4 MG CAPS capsule    Sig: Take 1 capsule (0.4 mg total) by mouth daily.    Dispense:  30 capsule    Refill:  3  . ciprofloxacin (CIPRO) 250 MG tablet     Sig: Take 1 tablet (250 mg total) by mouth 2 (two) times daily.    Dispense:  14 tablet    Refill:  0    Tommi Rumps, MD [place]

## 2015-11-23 NOTE — Telephone Encounter (Signed)
Please see if the patient is actually taking the tizanidine and how frequently. If she is not taking of that frequently she could hold this while on the ciprofloxacin. The other option would be to place her on a different antibiotic to cover for infection. Thanks.

## 2015-11-23 NOTE — Telephone Encounter (Signed)
There is a drug interaction with the Cipro that was prescribed by Dr. Caryl Bis and the Tirzandine that  Dr.Duncan prescribed for the patient.

## 2015-11-23 NOTE — Telephone Encounter (Signed)
Spoke with patient. She states she is not taking the tizanidine at this time. Takes it as needed and has not taken recently. She can proceed with ciprofloxacin. She was advised not to take the Zanaflex while taking ciprofloxacin. Also informed her of her x-ray results. Lees call the pharmacy and inform them that this is okay for them to fill.

## 2015-11-23 NOTE — Assessment & Plan Note (Signed)
Patient's symptoms and history likely consistent with nephrolithiasis versus UTI versus early pyelonephritis. Benign abdominal exam. Benign lower extremity neurological exam. UA does have blood and leukocytes on it. Given concern for nephrolithiasis we will treat with Flomax and pain control with tramadol that she already has. We will send her urine for culture. Given concern for infection and CVA tenderness and history of stone we will cover with ciprofloxacin for possible infection. We will obtain a KUB to evaluate for stones. If unable to pass in the next week or if she does indeed have a urinary tract infection she will see her urologist. If unable to gain pain control at home she'll seek medical attention. She is given return precautions.

## 2015-11-23 NOTE — Telephone Encounter (Signed)
Spoke with the pharmacy, they have the results. thanks

## 2015-11-26 ENCOUNTER — Telehealth: Payer: Self-pay

## 2015-11-26 LAB — URINE CULTURE

## 2015-11-26 NOTE — Telephone Encounter (Signed)
-----   Message from Leone Haven, MD sent at 11/26/2015 12:38 PM EDT ----- Please let the patient know that her urine culture revealed E coli and should be responsive to cipro. Given this finding and that she has a kidney stone she needs to see her urologist. We can help arrange this for her if she would like. Thanks.

## 2015-11-26 NOTE — Telephone Encounter (Signed)
Called patient.  No answer.

## 2015-11-26 NOTE — Telephone Encounter (Signed)
Called patient with lab results. No answer. Will try later.

## 2015-11-28 NOTE — Telephone Encounter (Signed)
Called patient. No answer. Will send MyChart message.

## 2015-11-29 ENCOUNTER — Other Ambulatory Visit: Payer: Self-pay | Admitting: Family Medicine

## 2015-12-04 NOTE — Telephone Encounter (Signed)
Electronic refill request. Last Filled:   Tizanidine:   60 tablet 3 08/03/2015  Last Filled:   Zolpidem  30 tablet 1 10/03/2015  Last Filled:   Tramadol 120 tablet 1 10/03/2015  Last office visit:   08/29/15  Please advise.

## 2015-12-05 NOTE — Telephone Encounter (Signed)
Please call in all three.  Thanks.

## 2015-12-05 NOTE — Telephone Encounter (Signed)
Refills called to pharmacy as instructed. 

## 2016-02-02 ENCOUNTER — Other Ambulatory Visit: Payer: Self-pay | Admitting: Family Medicine

## 2016-02-03 NOTE — Telephone Encounter (Signed)
Please call in.  Thanks.   

## 2016-02-04 NOTE — Telephone Encounter (Signed)
Medication phoned to pharmacy.  

## 2016-02-13 ENCOUNTER — Telehealth: Payer: Self-pay | Admitting: Family Medicine

## 2016-02-13 MED ORDER — METHYLPHENIDATE HCL 5 MG PO TABS: 10.0000 mg | ORAL_TABLET | Freq: Two times a day (BID) | ORAL | 0 refills | Status: DC

## 2016-02-13 NOTE — Telephone Encounter (Signed)
Printed.  Thanks.  

## 2016-02-14 NOTE — Telephone Encounter (Signed)
rx placed at front desk ready for pick up.

## 2016-03-06 ENCOUNTER — Telehealth: Payer: 59 | Admitting: Physician Assistant

## 2016-03-06 DIAGNOSIS — R399 Unspecified symptoms and signs involving the genitourinary system: Secondary | ICD-10-CM

## 2016-03-06 MED ORDER — SULFAMETHOXAZOLE-TRIMETHOPRIM 800-160 MG PO TABS: 1.0000 | ORAL_TABLET | Freq: Two times a day (BID) | ORAL | 0 refills | Status: DC

## 2016-03-06 NOTE — Progress Notes (Signed)

## 2016-04-04 ENCOUNTER — Other Ambulatory Visit: Payer: Self-pay | Admitting: Family Medicine

## 2016-04-07 NOTE — Telephone Encounter (Signed)
Received refill electronically Last refill Tramadol  02/03/16 #120/1 Last refill Zolpidem 02/03/16 #30/1 Last office visit 11/23/15/acute

## 2016-04-08 NOTE — Telephone Encounter (Signed)
Please call in both.  Thanks.  

## 2016-04-08 NOTE — Telephone Encounter (Signed)
Medication phoned to pharmacy.  

## 2016-05-20 ENCOUNTER — Other Ambulatory Visit: Payer: Self-pay | Admitting: Family Medicine

## 2016-05-20 NOTE — Telephone Encounter (Signed)
Electronic refill request. Last Filled:    60 tablet 3 12/05/2015  Last office visit:   08/29/15 CPE  Please advise.

## 2016-05-21 NOTE — Telephone Encounter (Signed)
Sent. Thanks.   

## 2016-06-04 ENCOUNTER — Other Ambulatory Visit: Payer: Self-pay | Admitting: Family Medicine

## 2016-06-04 NOTE — Telephone Encounter (Signed)
Received refill electronically Last refill Zolpidem 04/08/16 #30/1 refill Tramadol last refill 120/1 refilll 04/08/16 Last office visit 11/23/15/acute

## 2016-06-04 NOTE — Telephone Encounter (Signed)
Received refill electronically Last refill 02/13/16 #180/3 scripts given Last office visit 11/23/15/acute

## 2016-06-05 MED ORDER — METHYLPHENIDATE HCL 5 MG PO TABS
10.0000 mg | ORAL_TABLET | Freq: Two times a day (BID) | ORAL | 0 refills | Status: DC
Start: 2016-06-05 — End: 2016-06-11

## 2016-06-05 MED ORDER — METHYLPHENIDATE HCL 5 MG PO TABS: 10.0000 mg | ORAL_TABLET | Freq: Two times a day (BID) | ORAL | 0 refills | Status: DC

## 2016-06-05 NOTE — Telephone Encounter (Signed)
Printed.  Thanks.  

## 2016-06-05 NOTE — Telephone Encounter (Signed)
Please call in.  Thanks.   

## 2016-06-05 NOTE — Telephone Encounter (Signed)
Patient advised.  Rx left at front desk for pick up. 

## 2016-06-05 NOTE — Telephone Encounter (Signed)
Medication phoned to pharmacy.  

## 2016-06-06 ENCOUNTER — Other Ambulatory Visit: Payer: Self-pay | Admitting: Family Medicine

## 2016-06-06 NOTE — Telephone Encounter (Signed)
Electronic refill request. Last Filled:    60 tablet 2 05/21/2016

## 2016-06-11 ENCOUNTER — Encounter: Payer: Self-pay | Admitting: Family Medicine

## 2016-06-11 ENCOUNTER — Ambulatory Visit (INDEPENDENT_AMBULATORY_CARE_PROVIDER_SITE_OTHER): Payer: 59 | Admitting: Family Medicine

## 2016-06-11 VITALS — BP 100/80 | HR 108 | Temp 100.8°F | Ht 68.25 in | Wt 207.5 lb

## 2016-06-11 DIAGNOSIS — J029 Acute pharyngitis, unspecified: Secondary | ICD-10-CM | POA: Diagnosis not present

## 2016-06-11 DIAGNOSIS — B349 Viral infection, unspecified: Secondary | ICD-10-CM | POA: Diagnosis not present

## 2016-06-11 DIAGNOSIS — R509 Fever, unspecified: Secondary | ICD-10-CM | POA: Diagnosis not present

## 2016-06-11 LAB — POC INFLUENZA A&B (BINAX/QUICKVUE)
INFLUENZA A, POC: NEGATIVE
Influenza B, POC: NEGATIVE

## 2016-06-11 LAB — POCT RAPID STREP A (OFFICE): RAPID STREP A SCREEN: NEGATIVE

## 2016-06-11 NOTE — Assessment & Plan Note (Signed)
Neg strep and flu test. Treat symptomatically. Rest  and fluids.

## 2016-06-11 NOTE — Patient Instructions (Signed)
Rest, fluids. Ibuprofen for fever, sore throat and body aches. Rest and time.  Call if cough progresses for possible cough suppressant.  Use mucinex DM at this time.

## 2016-06-11 NOTE — Progress Notes (Signed)
   Subjective:    Patient ID: [name], female    DOB: 01-31-1981, 35 y.o.   MRN: [id number]  Fever   This is a new problem. The current episode started yesterday. The problem has been rapidly worsening. The maximum temperature noted was 101 to 101.9 F. Associated symptoms include congestion, coughing, ear pain, headaches, muscle aches, a sore throat and wheezing. Associated symptoms comments:  Mild SOB Fatigue  pain in bilateral pain in ears and fullness. She has tried acetaminophen (sudafed) for the symptoms. The treatment provided mild relief.  Risk factors: sick contacts   Risk factors: no immunosuppression   Risk factors comment:  She works in call center Sore Throat   Associated symptoms include congestion, coughing, ear pain and headaches.    Hx: nonsmoker, allergic rhinitis  She did not get a flu shot this year.  Review of Systems  Constitutional: Positive for fever.  HENT: Positive for congestion, ear pain and sore throat.   Respiratory: Positive for cough and wheezing.   Neurological: Positive for headaches.       Objective:   Physical Exam  Constitutional: Vital signs are normal. She appears well-developed and well-nourished. She is cooperative.  Non-toxic appearance. She does not appear ill. No distress.  HENT:  Head: Normocephalic.  Right Ear: Hearing, external ear and ear canal normal. Tympanic membrane is injected. Tympanic membrane is not erythematous, not retracted and not bulging. A middle ear effusion is present.  Left Ear: Hearing, external ear and ear canal normal. Tympanic membrane is injected. Tympanic membrane is not erythematous, not retracted and not bulging. A middle ear effusion is present.  Nose: Mucosal edema and rhinorrhea present. Right sinus exhibits no maxillary sinus tenderness and no frontal sinus tenderness. Left sinus exhibits no maxillary sinus tenderness and no frontal sinus tenderness.  Mouth/Throat: Uvula is midline and mucous  membranes are normal. Posterior oropharyngeal edema and posterior oropharyngeal erythema present. No oropharyngeal exudate or tonsillar abscesses.  Eyes: Conjunctivae, EOM and lids are normal. Pupils are equal, round, and reactive to light. Lids are everted and swept, no foreign bodies found.  Neck: Trachea normal and normal range of motion. Neck supple. Carotid bruit is not present. No thyroid mass and no thyromegaly present.  Cardiovascular: Normal rate, regular rhythm, S1 normal, S2 normal, normal heart sounds, intact distal pulses and normal pulses.  Exam reveals no gallop and no friction rub.   No murmur heard. Pulmonary/Chest: Effort normal and breath sounds normal. No tachypnea. No respiratory distress. She has no decreased breath sounds. She has no wheezes. She has no rhonchi. She has no rales.  Neurological: She is alert.  Skin: Skin is warm, dry and intact. No rash noted.  Psychiatric: Her speech is normal and behavior is normal. Judgment normal. Her mood appears not anxious. Cognition and memory are normal. She does not exhibit a depressed mood.          Assessment & Plan:

## 2016-07-07 DIAGNOSIS — S82202A Unspecified fracture of shaft of left tibia, initial encounter for closed fracture: Secondary | ICD-10-CM

## 2016-07-07 HISTORY — DX: Unspecified fracture of shaft of left tibia, initial encounter for closed fracture: S82.202A

## 2016-07-25 ENCOUNTER — Emergency Department (HOSPITAL_BASED_OUTPATIENT_CLINIC_OR_DEPARTMENT_OTHER)
Admission: EM | Admit: 2016-07-25 | Discharge: 2016-07-25 | Disposition: A | Discharge status: Home / Self Care | Payer: 59 | Attending: Emergency Medicine | Admitting: Emergency Medicine

## 2016-07-25 ENCOUNTER — Encounter (HOSPITAL_BASED_OUTPATIENT_CLINIC_OR_DEPARTMENT_OTHER): Payer: Self-pay | Admitting: Emergency Medicine

## 2016-07-25 ENCOUNTER — Emergency Department (HOSPITAL_BASED_OUTPATIENT_CLINIC_OR_DEPARTMENT_OTHER): Payer: 59

## 2016-07-25 DIAGNOSIS — Z87891 Personal history of nicotine dependence: Secondary | ICD-10-CM | POA: Insufficient documentation

## 2016-07-25 DIAGNOSIS — W000XXA Fall on same level due to ice and snow, initial encounter: Secondary | ICD-10-CM | POA: Insufficient documentation

## 2016-07-25 DIAGNOSIS — Y939 Activity, unspecified: Secondary | ICD-10-CM | POA: Diagnosis not present

## 2016-07-25 DIAGNOSIS — Y999 Unspecified external cause status: Secondary | ICD-10-CM | POA: Insufficient documentation

## 2016-07-25 DIAGNOSIS — S8992XA Unspecified injury of left lower leg, initial encounter: Secondary | ICD-10-CM | POA: Diagnosis present

## 2016-07-25 DIAGNOSIS — S82142A Displaced bicondylar fracture of left tibia, initial encounter for closed fracture: Secondary | ICD-10-CM

## 2016-07-25 DIAGNOSIS — M25562 Pain in left knee: Secondary | ICD-10-CM | POA: Diagnosis not present

## 2016-07-25 DIAGNOSIS — S82292A Other fracture of shaft of left tibia, initial encounter for closed fracture: Secondary | ICD-10-CM | POA: Insufficient documentation

## 2016-07-25 DIAGNOSIS — Y929 Unspecified place or not applicable: Secondary | ICD-10-CM | POA: Diagnosis not present

## 2016-07-25 DIAGNOSIS — S82392A Other fracture of lower end of left tibia, initial encounter for closed fracture: Secondary | ICD-10-CM | POA: Diagnosis not present

## 2016-07-25 MED ORDER — OXYCODONE-ACETAMINOPHEN 5-325 MG PO TABS
1.0000 | ORAL_TABLET | Freq: Once | ORAL | Status: AC
  Administered 2016-07-25: 1 via ORAL
  Filled 2016-07-25: qty 1

## 2016-07-25 MED ORDER — OXYCODONE-ACETAMINOPHEN 5-325 MG PO TABS: 1.0000 | ORAL_TABLET | Freq: Four times a day (QID) | ORAL | 0 refills | Status: DC | PRN

## 2016-07-25 MED FILL — OXYCODONE W/APAP 5/325 TAB: 5-325 | 5 days supply | Qty: 20 | Fill #0

## 2016-07-25 NOTE — ED Triage Notes (Signed)
Pt c/o LT knee after falling on ice.

## 2016-07-25 NOTE — Discharge Instructions (Signed)
You have a fracture on your lower leg bone (tibia). We applied a knee immobilizer. Please follow up with orthopedics.

## 2016-07-25 NOTE — ED Provider Notes (Signed)
[facility] DEPT [facility] Provider Note   CSN: [id number] Arrival date & time: 07/25/16  [id number]     History   Chief Complaint Chief Complaint  Patient presents with  . Knee Pain    HPI [name] is a 36 y.o. female with PMH nephrolithiasis and back pain who presents with L knee pain after falling on ice.   HPI Patient presents with L knee pain after falling on ice this morning. She slipped and fell on her left knee and she heard a pop; then she rolled on her bottom. She reports she was able to bear weight on her left leg right after. She denies pain in her back or bottom. Denies any pain or weakness in right leg.   Past Medical History:  Diagnosis Date  . ADD (attention deficit disorder)    testing done 2016  . ALLERGIC RHINITIS 10/11/2009  . Allergy to latex 04/11/2010  . ANKLE PAIN, RIGHT 04/03/2009  . BACK PAIN 03/19/2009  . COMMON MIGRAINE 03/19/2009  . FLANK PAIN, RIGHT 05/11/2009  . GLUCOSE INTOLERANCE 03/19/2009  . HYDRONEPHROSIS, RIGHT 05/11/2009  . INSOMNIA-SLEEP DISORDER-UNSPEC 03/19/2009  . NEPHROLITHIASIS, HX OF 03/19/2009  . RENAL CALCULUS, RIGHT 05/11/2009    Patient Active Problem List   Diagnosis Date Noted  . Acute viral syndrome 06/11/2016  . Encounter for general adult medical examination with abnormal findings 08/30/2015  . Pain in joint, shoulder region 08/30/2015  . Rash and nonspecific skin eruption 11/29/2014  . Ureteral stone 09/24/2014  . ADD (attention deficit disorder) 06/20/2014  . Obesity (BMI 30-39.9) 08/15/2013  . Stress incontinence 08/15/2013  . Anxiety state, unspecified 08/15/2013  . Chronic back pain 03/23/2013  . Nephrolithiasis 12/31/2012  . Unspecified constipation 12/29/2012  .  LATEX ALLERGY  04/11/2010  . ALLERGIC RHINITIS 10/11/2009  . COMMON MIGRAINE 03/19/2009  . INSOMNIA-SLEEP DISORDER-UNSPEC 03/19/2009    Past Surgical History:  Procedure Laterality Date  . ABDOMINAL HYSTERECTOMY    . APPENDECTOMY  1994  .  CYSTOSCOPY W/ URETERAL STENT PLACEMENT Left 09/24/2014   Procedure: CYSTOSCOPY WITH RETROGRADE PYELOGRAM/LEFT URETERAL STENT PLACEMENT;  Surgeon: Raynelle Bring, MD;  Location: [facility];  Service: Urology;  Laterality: Left;  . EYE SURGERY Bilateral    lens replacement 2014  . hx of etopic pregnancy  2005  . LITHOTRIPSY  2011  . OOPHORECTOMY  07/2008  . s/p left knee arthroscopic  1994    OB History    No data available       Home Medications    Prior to Admission medications   Medication Sig Start Date End Date Taking? Authorizing Provider  EPINEPHrine 0.3 mg/0.3 mL IJ SOAJ injection Inject 0.3 mLs (0.3 mg total) into the muscle once. Okay to fill wit comparable epinephrine auto injector 08/29/15   Tonia Ghent, MD  methylphenidate (RITALIN) 5 MG tablet Take 2-3 tablets (10-15 mg total) by mouth 2 (two) times daily. 06/05/16   Tonia Ghent, MD  oxyCODONE-acetaminophen (PERCOCET/ROXICET) 5-325 MG tablet Take 1 tablet by mouth every 6 (six) hours as needed for severe pain. 07/25/16   Smiley Houseman, MD  tiZANidine (ZANAFLEX) 4 MG tablet TAKE 1 TABLET EVERY 6 HOURS AS NEEDED 05/21/16   Tonia Ghent, MD  traMADol (ULTRAM) 50 MG tablet TAKE 1 TABLET BY MOUTH EVERY 6 HOURS AS NEEDED 06/05/16   Tonia Ghent, MD  zolpidem (AMBIEN CR) 12.5 MG CR tablet TAKE 1 TABLET BY MOUTH AT BEDTIME 06/05/16   Tonia Ghent, MD  Family History Family History  Problem Relation Age of Onset  . Alcohol abuse Mother     ETOH  . Bipolar disorder Mother     manic depressive/bipolar  . Thyroid disease Mother     Low thyroid  . Hypertension Father   . Hypertension Other   . Diabetes Other   . Heart disease Other   . Stroke Other   . Cancer Other     Lung Cancer  . Colon cancer Maternal Grandfather   . Breast cancer Neg Hx     Social History Social History  Substance Use Topics  . Smoking status: Former Smoker    Types: Cigarettes  . Smokeless tobacco: Never Used     Comment:  quit with chantix 2010  . Alcohol use 0.0 oz/week     Comment: rare     Allergies   Amoxicillin-pot clavulanate; Latex; Aspirin; Eggs or egg-derived products; Ibuprofen; Nsaids; Other; Penicillins; and Red dye   Review of Systems Review of Systems  Musculoskeletal:       Knee pain       Physical Exam Updated Vital Signs BP 119/61 (BP Location: Right Arm)   Pulse 81   Temp 99.2 F (37.3 C) (Oral)   Resp 18   Ht 5\' 8"  (1.727 m)   Wt 93 kg   SpO2 100%   BMI 31.17 kg/m   Physical Exam  Constitutional: She appears well-developed and well-nourished.  In some distress when L knee is moved   Musculoskeletal:  Left Knee: swelling and effusion noted. Tenderness of the medial and lateral joint lines and around the patella. Normal DP and PT pulses. Normal sensation to light touch. Pain with active lifting of left lower extremity. Unable to flex knee.      ED Treatments / Results  Labs (all labs ordered are listed, but only abnormal results are displayed) Labs Reviewed - No data to display  EKG  EKG Interpretation None       Radiology Dg Knee Complete 4 Views Left  Result Date: 07/25/2016 CLINICAL DATA:  Fall on ice, landing on left knee with knee pain. EXAM: LEFT KNEE - COMPLETE 4+ VIEW COMPARISON:  None. FINDINGS: Subtle vertical nondisplaced fracture of the lateral tibial plateau is suspected. No associated subluxation or visible plateau depression. There is an associated joint effusion visible primarily in the suprapatellar region on the lateral view. No bony lesions. IMPRESSION: Suspect acute nondisplaced fracture of the lateral tibial plateau with associated joint effusion. No obvious depression of the lateral tibial plateau. CT evaluation may be helpful for further potential surgical planning. Electronically Signed   By: Aletta Edouard M.D.   On: 07/25/2016 10:52    Procedures Procedures (including critical care time)  Medications Ordered in ED Medications    oxyCODONE-acetaminophen (PERCOCET/ROXICET) 5-325 MG per tablet 1 tablet (1 tablet Oral Given 07/25/16 1022)     Initial Impression / Assessment and Plan / ED Course  I have reviewed the triage vital signs and the nursing notes.  Pertinent labs & imaging results that were available during my care of the patient were reviewed by me and considered in my medical decision making (see chart for details).    Patient with L knee pain after falling on knee. X-ray left knee shows "suspect acute nondisplaced fracture of the lateral tibial plateau with associated joint effusion. No obvious depression of the lateral tibial plateau. Knee placed in brace. Patient given contact information to follow up with orthopedics. Percocet PRN given for  pain.   Final Clinical Impressions(s) / ED Diagnoses   Final diagnoses:  Closed fracture of left tibial plateau, initial encounter    New Prescriptions Discharge Medication List as of 07/25/2016 11:04 AM    START taking these medications   Details  oxyCODONE-acetaminophen (PERCOCET/ROXICET) 5-325 MG tablet Take 1 tablet by mouth every 6 (six) hours as needed for severe pain., Starting Fri 07/25/2016, Print         Smiley Houseman, MD 07/25/16 [place], MD 07/26/16 1459

## 2016-07-25 NOTE — ED Notes (Signed)
Patient transported to X-ray 

## 2016-08-04 ENCOUNTER — Other Ambulatory Visit: Payer: Self-pay | Admitting: Family Medicine

## 2016-08-05 NOTE — Telephone Encounter (Signed)
Ok to refill? Electronically refill request for zolpidem 12.5mg    tablet # 30    with 1   refill  Last prescribed on 06/05/16   Ok to refill? Electronically refill request for tramadol 50mg    tablet # 120  with  1  refill  Last prescribed on 06/05/16. Last seen on by you on 08/29/15 for cpx, no future appt scheduled.

## 2016-08-06 NOTE — Telephone Encounter (Signed)
Called in to [id number] - [place], [place]: [phone] Spoke with patient, pt will call back to schedule a CPE, she said she fell and broke her leg and has been using crutches for 6 weeks now.

## 2016-08-06 NOTE — Telephone Encounter (Signed)
Please call in.  Thanks.  Needs CPE this spring.  Thanks.

## 2016-08-16 ENCOUNTER — Other Ambulatory Visit: Payer: Self-pay | Admitting: Family Medicine

## 2016-08-16 NOTE — Telephone Encounter (Signed)
Last office visit 06/11/16 with Dr. Diona Browner.  Last refilled 05/21/16 for #60 with 2 refills.  Ok to refill?

## 2016-08-17 NOTE — Telephone Encounter (Signed)
Sent. Thanks.   

## 2016-09-29 ENCOUNTER — Ambulatory Visit (INDEPENDENT_AMBULATORY_CARE_PROVIDER_SITE_OTHER): Payer: 59 | Admitting: Family Medicine

## 2016-09-29 ENCOUNTER — Encounter: Payer: Self-pay | Admitting: Family Medicine

## 2016-09-29 VITALS — BP 104/64 | HR 88 | Temp 98.6°F | Ht 67.0 in | Wt 211.8 lb

## 2016-09-29 DIAGNOSIS — M545 Low back pain, unspecified: Secondary | ICD-10-CM

## 2016-09-29 DIAGNOSIS — R221 Localized swelling, mass and lump, neck: Secondary | ICD-10-CM | POA: Diagnosis not present

## 2016-09-29 DIAGNOSIS — Z0001 Encounter for general adult medical examination with abnormal findings: Secondary | ICD-10-CM | POA: Diagnosis not present

## 2016-09-29 DIAGNOSIS — K625 Hemorrhage of anus and rectum: Secondary | ICD-10-CM

## 2016-09-29 DIAGNOSIS — S82142S Displaced bicondylar fracture of left tibia, sequela: Secondary | ICD-10-CM

## 2016-09-29 DIAGNOSIS — G8929 Other chronic pain: Secondary | ICD-10-CM

## 2016-09-29 DIAGNOSIS — F988 Other specified behavioral and emotional disorders with onset usually occurring in childhood and adolescence: Secondary | ICD-10-CM

## 2016-09-29 DIAGNOSIS — G4700 Insomnia, unspecified: Secondary | ICD-10-CM

## 2016-09-29 MED ORDER — TRAMADOL HCL 50 MG PO TABS: 50.0000 mg | ORAL_TABLET | Freq: Four times a day (QID) | ORAL | 1 refills | Status: DC | PRN

## 2016-09-29 MED ORDER — HYDROCORTISONE ACETATE 25 MG RE SUPP: 25.0000 mg | Freq: Two times a day (BID) | RECTAL | 0 refills | Status: DC | PRN

## 2016-09-29 MED ORDER — ZOLPIDEM TARTRATE ER 12.5 MG PO TBCR: 12.5000 mg | EXTENDED_RELEASE_TABLET | Freq: Every day | ORAL | 1 refills | Status: DC

## 2016-09-29 NOTE — Progress Notes (Signed)
CPE- See plan.  Routine anticipatory guidance given to patient.  See health maintenance.  The possibility exists that previously documented standard health maintenance information may have been brought forward from a previous encounter into this note.  If needed, that same information has been updated to reflect the current situation based on today's encounter.    Tetanus 2015 Flu- she has an egg allergy on testing prev. Deferred.   PNA and shingles not due.  Mammogram not due.   Pap not indicated.   S/p hysterectomy.  TAH and BSO.  Stable night sweats and hot flashes.  She can tolerate the hot flashes.   Consider DXA ~age 76, d/w pt. Defer for now given the recent fx.   Living will d/w pt.  Husband designated if patient were incapacitated.  Diet and exercise d/w pt.  Exercise clearly affected by her leg injury.   Pt d/w pt re: HIV screening.  Prev done 2005.    h/o L tibial plateau fracture. Seeing Dr. Mayer Camel.   She just graduated from crutches.  She has some numbness in the L foot, likely from prev knee issues, with nerve conduction testing pending- this is some better.    Insomnia controlled with ambien w/o ADE on med.  She doesn't sleep well off med.  She isn't restless.    BM frequency 2-3 times per week, not painful or hard now.  Having some hemorrhoid irritation.  BRBPR with a BM.  Tried prep H and wipes.  Not having to strain.  She was prev on miralax to help with side effects from pain meds for L knee prev.  On less pain medicine now.  Neck fullness, worse in the meantime.  Trouble with weight loss, ie unable to lose weight easily in spite of diet.  FH of thyroid disease- sister and mother.    ADD.  Ritalin used on work days. No ADE on med.  It helps, compliant.    H/o chronic L SI and lower back pain, has seen ortho. Burning in the area, but not down the posterior leg as with sciatica.  No ADEs with current meds. Is stretching as much as possible. Overall less painful than in years  past, but with occ flares of worse sx.  No weakness. Not taking tramadol daily, but usually every few days.    PMH and SH reviewed  Meds, vitals, and allergies reviewed.   ROS: Per HPI.  Unless specifically indicated otherwise in HPI, the patient denies:  General: fever. Eyes: acute vision changes ENT: sore throat Cardiovascular: chest pain Respiratory: SOB GI: vomiting GU: dysuria Musculoskeletal: acute back pain Derm: acute rash Neuro: acute motor dysfunction Psych: worsening mood Endocrine: polydipsia Heme: bleeding Allergy: hayfever  GEN: nad, alert and oriented HEENT: mucous membranes moist NECK: supple w/o LA, TMG noted. Not ttp CV: rrr. PULM: ctab, no inc wob ABD: soft, +bs EXT: no edema Back nontender in the midline. No CVA pain. Left lower back tender to palpation. Rectal exam deferred due to leg and back pain and since it would likely would not change management. Discussed with patient. She agrees. Left knee in brace.

## 2016-09-29 NOTE — Patient Instructions (Addendum)
Go to the lab on the way out.  We'll contact you with your lab report. Rosaria Ferries will call about your referral. We'll be in touch after I get the ultrasound results.  Use the suppositories and update [place] if not better.  Take care.  Glad to see you.

## 2016-09-29 NOTE — Progress Notes (Signed)
Pre visit review using our clinic review tool, if applicable. No additional management support is needed unless otherwise documented below in the visit note. 

## 2016-09-30 ENCOUNTER — Telehealth: Payer: Self-pay | Admitting: *Deleted

## 2016-09-30 DIAGNOSIS — R221 Localized swelling, mass and lump, neck: Secondary | ICD-10-CM | POA: Insufficient documentation

## 2016-09-30 DIAGNOSIS — K625 Hemorrhage of anus and rectum: Secondary | ICD-10-CM | POA: Insufficient documentation

## 2016-09-30 LAB — CBC WITH DIFFERENTIAL/PLATELET
BASOS ABS: 0.1 10*3/uL (ref 0.0–0.1)
Basophils Relative: 0.8 % (ref 0.0–3.0)
Eosinophils Absolute: 0.5 10*3/uL (ref 0.0–0.7)
Eosinophils Relative: 4.1 % (ref 0.0–5.0)
HCT: 42.8 % (ref 36.0–46.0)
HEMOGLOBIN: 14.4 g/dL (ref 12.0–15.0)
LYMPHS ABS: 4.7 10*3/uL — AB (ref 0.7–4.0)
Lymphocytes Relative: 36.3 % (ref 12.0–46.0)
MCHC: 33.8 g/dL (ref 30.0–36.0)
MCV: 85.4 fl (ref 78.0–100.0)
MONO ABS: 1 10*3/uL (ref 0.1–1.0)
MONOS PCT: 7.8 % (ref 3.0–12.0)
NEUTROS PCT: 51 % (ref 43.0–77.0)
Neutro Abs: 6.7 10*3/uL (ref 1.4–7.7)
Platelets: 401 10*3/uL — ABNORMAL HIGH (ref 150.0–400.0)
RBC: 5.01 Mil/uL (ref 3.87–5.11)
RDW: 13.2 % (ref 11.5–15.5)
WBC: 13.1 10*3/uL — AB (ref 4.0–10.5)

## 2016-09-30 LAB — COMPREHENSIVE METABOLIC PANEL
ALK PHOS: 106 U/L (ref 39–117)
ALT: 25 U/L (ref 0–35)
AST: 23 U/L (ref 0–37)
Albumin: 4.4 g/dL (ref 3.5–5.2)
BILIRUBIN TOTAL: 0.2 mg/dL (ref 0.2–1.2)
BUN: 16 mg/dL (ref 6–23)
CALCIUM: 9.8 mg/dL (ref 8.4–10.5)
CO2: 29 mEq/L (ref 19–32)
Chloride: 105 mEq/L (ref 96–112)
Creatinine, Ser: 0.97 mg/dL (ref 0.40–1.20)
GFR: 68.8 mL/min (ref >60.00)
GLUCOSE: 94 mg/dL (ref 70–99)
Potassium: 4.4 mEq/L (ref 3.5–5.1)
Sodium: 139 mEq/L (ref 135–145)
Total Protein: 7.5 g/dL (ref 6.0–8.3)

## 2016-09-30 LAB — TSH: TSH: 3.82 u[IU]/mL (ref 0.35–4.50)

## 2016-09-30 LAB — VITAMIN D 25 HYDROXY (VIT D DEFICIENCY, FRACTURES): VITD: 26.75 ng/mL — ABNORMAL LOW (ref 30.00–100.00)

## 2016-09-30 MED ORDER — PRAMOXINE HCL 1 % RE FOAM: 1.0000 "application " | Freq: Three times a day (TID) | RECTAL | 0 refills | Status: DC | PRN

## 2016-09-30 NOTE — Assessment & Plan Note (Signed)
No black stools. Given the knee pain and back pain it did not make a lot of sense to get patient up on exam table today. Rectal exam deferred. Discussed with patient. She agrees. She does not have hard stools currently. She is trying not to strain. Likely has hemorrhoid irritation based on the way she describes her situation. Okay to use hydrocortisone suppositories and update [place] as needed. She agrees.

## 2016-09-30 NOTE — Assessment & Plan Note (Signed)
Likely thyroid enlargement. Check TSH and ultrasound. She agrees. No stridor. No airway symptoms.

## 2016-09-30 NOTE — Telephone Encounter (Signed)
Okay to try proctofoam, rx sent.  Update me if prohibitive cost.  Thanks.

## 2016-09-30 NOTE — Telephone Encounter (Signed)
Received a faxed form from [facility] stating that Hydrocortisone AC 25 mg supp is going to cost the patient $142.99 and patient prefers something else.

## 2016-09-30 NOTE — Telephone Encounter (Signed)
[facility]  They recommended OTC Preparation H.  Please advise.

## 2016-09-30 NOTE — Assessment & Plan Note (Signed)
Tetanus 2015 Flu- she has an egg allergy on testing prev. Deferred.   PNA and shingles not due.  Mammogram not due.   Pap not indicated.   S/p hysterectomy.  TAH and BSO.  Stable night sweats and hot flashes.  She can tolerate the hot flashes.   Consider DXA ~age 36, d/w pt. Defer for now given the recent fx.   Living will d/w pt.  Husband designated if patient were incapacitated.  Diet and exercise d/w pt.  Exercise clearly affected by her leg injury.   Pt d/w pt re: HIV screening.  Prev done 2005.

## 2016-09-30 NOTE — Assessment & Plan Note (Signed)
Insomnia controlled with ambien w/o ADE on med.  She doesn't sleep well off med.  She isn't restless.   Discussed with patient about medication use. Routine cautions given.

## 2016-09-30 NOTE — Telephone Encounter (Signed)
Do they have any idea about steroid suppositories that would be cheaper? Please let me know.  Thanks.

## 2016-09-30 NOTE — Addendum Note (Signed)
Addended by: Tonia Ghent on: 09/30/2016 04:55 PM   Modules accepted: Orders

## 2016-09-30 NOTE — Assessment & Plan Note (Signed)
Controlled. Continue current medications. No adverse effect on medication. She agrees.

## 2016-09-30 NOTE — Telephone Encounter (Signed)
Spoke with pharmacist.  They do not know of anything that would be cheaper or covered by insurance.

## 2016-09-30 NOTE — Telephone Encounter (Signed)
Left message for [facility] that Dr. Damita Dunnings has sent is a different medication for her to try.  Advised to call us back if this medication is also too expensive.

## 2016-09-30 NOTE — Telephone Encounter (Signed)
She tried and failed that.  Do they have any idea about steroid suppositories or creams that would be cheaper?

## 2016-09-30 NOTE — Assessment & Plan Note (Signed)
Continue tramadol as needed along with muscle relaxer. Continue stretching. Update me as needed. She agrees.

## 2016-10-02 ENCOUNTER — Other Ambulatory Visit: Payer: Self-pay | Admitting: Family Medicine

## 2016-10-02 MED ORDER — VITAMIN D3 25 MCG (1000 UT) PO CAPS: 1000.0000 [IU] | ORAL_CAPSULE | Freq: Every day | ORAL | Status: DC

## 2016-10-06 ENCOUNTER — Ambulatory Visit
Admission: RE | Admit: 2016-10-06 | Discharge: 2016-10-06 | Disposition: A | Discharge status: Home / Self Care | Payer: 59 | Source: Ambulatory Visit | Attending: Family Medicine | Admitting: Family Medicine

## 2016-10-06 DIAGNOSIS — R221 Localized swelling, mass and lump, neck: Secondary | ICD-10-CM

## 2016-10-09 ENCOUNTER — Other Ambulatory Visit: Payer: Self-pay | Admitting: Family Medicine

## 2016-10-09 DIAGNOSIS — E049 Nontoxic goiter, unspecified: Secondary | ICD-10-CM

## 2016-11-04 ENCOUNTER — Other Ambulatory Visit: Payer: Self-pay | Admitting: Family Medicine

## 2016-11-05 NOTE — Telephone Encounter (Signed)
Please call in.  Thanks.   

## 2016-11-05 NOTE — Telephone Encounter (Signed)
Last refill 08/17/16 #60 +2 refills. Last OV 09/29/16. Ok to refill?

## 2016-11-05 NOTE — Telephone Encounter (Signed)
It was e-scribed.

## 2016-11-20 ENCOUNTER — Ambulatory Visit (INDEPENDENT_AMBULATORY_CARE_PROVIDER_SITE_OTHER): Payer: 59 | Admitting: Internal Medicine

## 2016-11-20 ENCOUNTER — Encounter: Payer: Self-pay | Admitting: Internal Medicine

## 2016-11-20 VITALS — BP 118/80 | HR 77 | Ht 67.0 in | Wt 208.0 lb

## 2016-11-20 DIAGNOSIS — R221 Localized swelling, mass and lump, neck: Secondary | ICD-10-CM

## 2016-11-20 DIAGNOSIS — E8941 Symptomatic postprocedural ovarian failure: Secondary | ICD-10-CM | POA: Diagnosis not present

## 2016-11-20 LAB — T3, FREE: T3 FREE: 3.5 pg/mL (ref 2.3–4.2)

## 2016-11-20 LAB — T4, FREE: FREE T4: 0.95 ng/dL (ref 0.60–1.60)

## 2016-11-20 LAB — TSH: TSH: 3.26 u[IU]/mL (ref 0.35–4.50)

## 2016-11-20 NOTE — Progress Notes (Signed)
Patient ID: [name], female   DOB: 17-May-1981, 36 y.o.   MRN: [id number]    HPI  [name] is a 36 y.o.-year-old female, referred by her PCP, Dr. Damita Dunnings, for evaluation for goiter.  Thyroid U/S (10/07/2016): Isthmus: 1.2 cm Right lobe: 4.1 x 1.2 x 1.9 cm Left lobe: 4.9 x 1.2 x 1.8 cm  Mild thyroid heterogeneity with small hypoechoic subcentimeter nodules bilaterally all measuring 4 mm or less in size, (1 on the right and 2 on the left). These incidental subcentimeter tiny nodules do not meet criteria for any follow-up biopsy.  Small benign-appearing lymph nodes bilaterally with short axis measurements of 6 mm or less.  Pt denies: - feeling nodules in neck - dysphagia - choking - SOB with lying down  But she c/o: - tightness in neck - occas. hoarseness  I reviewed pt's thyroid tests: Lab Results  Component Value Date   TSH 3.82 09/29/2016   TSH 2.87 10/04/2015   TSH 3.10 08/15/2013    Pt c/o: - + inability to lose weight despite a low carb diet  But denies: - fatigue - heat intolerance/cold intolerance - tremors - palpitations - anxiety/depression - hyperdefecation/constipation - weight loss/weight gain - dry skin - hair loss  + FH of thyroid ds: M and sister. No FH of thyroid cancer. No h/o radiation tx to head or neck.  No seaweed or kelp. No recent contrast studies. No steroid use. No herbal supplements. No Biotin supplements or Hair, Skin and Nails vitamins.  Pt also has a history of PCOS, endometriosis >> TAH + BSA in 2010. H/o ectopic pregnancy. She was told that because of the possible high risk for BrCa >> not started on HRT. + FH of BrCa in [place].   She had a L tibial plateau fx afer falling of ice.  ROS: Constitutional: no weight gain/loss, no fatigue, no subjective hyperthermia/hypothermia Eyes: no blurry vision, no xerophthalmia ENT: no sore throat, no nodules palpated in throat, no dysphagia/odynophagia, no  hoarseness Cardiovascular: no CP/SOB/palpitations/leg swelling Respiratory: no cough/SOB Gastrointestinal: no N/V/D/C Musculoskeletal: no muscle/joint aches Skin: no rashes Neurological: no tremors/numbness/tingling/dizziness Psychiatric: no depression/anxiety  Past Medical History:  Diagnosis Date  . ADD (attention deficit disorder)    testing done 2016  . ALLERGIC RHINITIS 10/11/2009  . Allergy to latex 04/11/2010  . ANKLE PAIN, RIGHT 04/03/2009  . BACK PAIN 03/19/2009  . COMMON MIGRAINE 03/19/2009  . FLANK PAIN, RIGHT 05/11/2009  . GLUCOSE INTOLERANCE 03/19/2009  . HYDRONEPHROSIS, RIGHT 05/11/2009  . INSOMNIA-SLEEP DISORDER-UNSPEC 03/19/2009  . Left tibial fracture 2018  . NEPHROLITHIASIS, HX OF 03/19/2009  . RENAL CALCULUS, RIGHT 05/11/2009   Past Surgical History:  Procedure Laterality Date  . ABDOMINAL HYSTERECTOMY    . APPENDECTOMY  1994  . CYSTOSCOPY W/ URETERAL STENT PLACEMENT Left 09/24/2014   Procedure: CYSTOSCOPY WITH RETROGRADE PYELOGRAM/LEFT URETERAL STENT PLACEMENT;  Surgeon: Raynelle Bring, MD;  Location: [facility];  Service: Urology;  Laterality: Left;  . EYE SURGERY Bilateral    lens replacement 2014  . hx of etopic pregnancy  2005  . LITHOTRIPSY  2011  . OOPHORECTOMY  07/2008  . s/p left knee arthroscopic  1994   Social History   Social History  . Marital status: Married    Spouse name: N/A  . Number of children: 0  . Years of education: N/A   Occupational History  . customer service [place]   Social History Main Topics  . Smoking status: Former Smoker  Types: Cigarettes  . Smokeless tobacco: Never Used     Comment: quit with chantix 2010  . Alcohol use 0.0 oz/week     Comment: rare  . Drug use: No  . Sexual activity: Yes   Other Topics Concern  . Not on file   Social History Narrative   Married to [employer]   Works at [employer], Scientist, forensic   Enjoys reading   No kids   Current Outpatient Prescriptions on File Prior to Visit   Medication Sig Dispense Refill  . Cholecalciferol (VITAMIN D3) 1000 units CAPS Take 1 capsule (1,000 Units total) by mouth daily.    . methylphenidate (RITALIN) 5 MG tablet Take 2-3 tablets (10-15 mg total) by mouth 2 (two) times daily. 180 tablet 0  . tiZANidine (ZANAFLEX) 4 MG tablet TAKE 1 TABLET EVERY 6 HOURS AS NEEDED 60 tablet 2  . traMADol (ULTRAM) 50 MG tablet Take 1 tablet (50 mg total) by mouth every 6 (six) hours as needed. 120 tablet 1  . zolpidem (AMBIEN CR) 12.5 MG CR tablet Take 1 tablet (12.5 mg total) by mouth at bedtime. 30 tablet 1  . EPINEPHrine 0.3 mg/0.3 mL IJ SOAJ injection Inject 0.3 mLs (0.3 mg total) into the muscle once. Okay to fill wit comparable epinephrine auto injector (Patient not taking: Reported on 11/20/2016) 2 Device 1  . hydrocortisone (ANUSOL-HC) 25 MG suppository Place 1 suppository (25 mg total) rectally 2 (two) times daily as needed for hemorrhoids or itching. (Patient not taking: Reported on 11/20/2016) 12 suppository 0   No current facility-administered medications on file prior to visit.    Allergies  Allergen Reactions  . Amoxicillin-Pot Clavulanate Swelling    Facial swelling  . Latex     Carries epipen  . Aspirin Other (See Comments)    Swelling, itching, rash  . Eggs Or Egg-Derived Products Other (See Comments)    Prev positive allergy testing   . Ibuprofen     Patient says that she can tolerate Ketorolac.  . Nsaids     Patient says that she can tolerate ketorolac.  . Other Other (See Comments)    Skin lotion- skin allergy  . Penicillins   . Red Dye     rash   Family History  Problem Relation Age of Onset  . Alcohol abuse Mother        ETOH  . Bipolar disorder Mother        manic depressive/bipolar  . Thyroid disease Mother        Low thyroid  . Hypertension Father   . Hypertension Other   . Diabetes Other   . Heart disease Other   . Stroke Other   . Cancer Other        Lung Cancer  . Colon cancer Maternal Grandmother   .  Breast cancer Paternal Grandmother    PE: BP 118/80 (BP Location: Left Arm, Patient Position: Sitting)   Pulse 77   Ht '5\' 7"'$  (1.702 m)   Wt 208 lb (94.3 kg)   BMI 32.58 kg/m  Wt Readings from Last 3 Encounters:  11/20/16 208 lb (94.3 kg)  09/29/16 211 lb 12 oz (96 kg)  07/25/16 205 lb (93 kg)   Constitutional: overweight, in NAD Eyes: PERRLA, EOMI, no exophthalmos ENT: moist mucous membranes, no thyromegaly but prominent neck, no cervical lymphadenopathy Cardiovascular: RRR, No MRG Respiratory: CTA B Gastrointestinal: abdomen soft, NT, ND, BS+ Musculoskeletal: no deformities, strength intact in all 4;  Skin: moist, warm,  no rashes Neurological: no tremor with outstretched hands, DTR normal in all 4  ASSESSMENT: 1. Goiter  2. S/p surgical menopause  PLAN: 1. Goiter - I reviewed the images of her thyroid ultrasound along with the patient. I pointed out that the nodules are very small, mostly cystic and not worrisome. They are very common and would not prompt further imaging or other investigation. Pt does not have a thyroid cancer family history or a personal history of RxTx to head/neck, so is not at high risk for [facility]. - The size of her thyroid is not large, actually, and the reason why she appears to have an enlarged thyroid is actually the enlarged SCM muscles that flank the thyroid. I showed her this on the U/S. However, the thyroid does appear heterogeneous, which can happen in Hashimoto's thyroiditis. - we had a long discussion about a possible Hashimoto thyroiditis diagnosis. I explained that this is an autoimmune disorder, in which she develops antibodies against her own thyroid. The antibodies bind to the thyroid tissue and cause inflammation, and, eventually, destruction of the gland and hypothyroidism. We don't know how long this process can be, it can last from months to years. As of now, based on the last results that I have, her thyroid tests are normal. We will repeat  them today, however. I will also add thyroid antibody levels. - I also explained that thyroid enlargement especially at the beginning of her Hashimoto thyroiditis course is not uncommon, and it has a waxing and waning character.  - We discussed about treatment for Hashimoto thyroiditis, which is actually limited to thyroid hormones in case her TFTs are abnormal. Supplements like selenium has been tried with various results, some showing improvement in the TPO antibodies. However, there are no randomized controlled trials of this are consistent results between trials. We also discussed about ways to improve her immune system (relaxation, diet, exercise, sleep) to reduce the Ab titer and, subsequently, the thyroid inflammation. - If all tests normal >> I advised her to f/u with PCP with annual TSH levels. If abnormal, I will see her back in 1 year or sooner ~ levels.  2. S/p surgical menopause - pt had TAH + BSO 8 years ago - she was not given HRT b/c possible risk of breast cancer. She does not have a particularly rich family history of breast cancer, only has this in grand grandmother. We discussed that we usually start HRT for early surgical menopause at least until the average age of menopause, which is 37 years old in [place]. We also discussed about consequences of low estrogen status. - She is symptomatic with hot flushes, problems with losing weight, insomnia. I think she would greatly benefit from starting an estrogen patch. She does not need progesterone due to lack of uterus.  - I suggested that she establishes care with OB/GYN again and discuss with them about this. She previously saw an OB/GYN doctor in [place]. I suggested to establish care here in town.  Component     Latest Ref Rng & Units 11/20/2016  TSH     0.35 - 4.50 uIU/mL 3.26  T4,Free(Direct)     0.60 - 1.60 ng/dL 0.95  Triiodothyronine,Free,Serum     2.3 - 4.2 pg/mL 3.5  Thyroglobulin Ab     <2 IU/mL <1  Thyroperoxidase Ab  SerPl-aCnc     <9 IU/mL 1   No sign of Hashimoto's thyroiditis. TFTs normal. Can continue to follow with PCP with annual TSH levels and  RTC to see me as needed.  Philemon Kingdom, MD PhD [facility]

## 2016-11-20 NOTE — Patient Instructions (Signed)
Please stop at the lab.  Please see ObGyn: Dr. Ronita Hipps, Dr. Toney Rakes for possible HRT.  Please return in 1 year.

## 2016-11-21 LAB — THYROID PEROXIDASE ANTIBODY: THYROID PEROXIDASE ANTIBODY: 1 [IU]/mL (ref <9)

## 2016-11-21 LAB — THYROGLOBULIN ANTIBODY

## 2016-12-03 DIAGNOSIS — N951 Menopausal and female climacteric states: Secondary | ICD-10-CM | POA: Diagnosis not present

## 2016-12-07 ENCOUNTER — Other Ambulatory Visit: Payer: Self-pay | Admitting: Family Medicine

## 2016-12-08 NOTE — Telephone Encounter (Signed)
Please call in.  Thanks.   

## 2016-12-08 NOTE — Telephone Encounter (Signed)
Received refill electronically Zolpidem last refill 09/29/16 #30/1 Tramadol 09/29/16 #120/1 Last office visit same date

## 2016-12-09 NOTE — Telephone Encounter (Signed)
Medication phoned to pharmacy.  

## 2016-12-22 ENCOUNTER — Ambulatory Visit (INDEPENDENT_AMBULATORY_CARE_PROVIDER_SITE_OTHER): Payer: 59 | Admitting: Family Medicine

## 2016-12-22 ENCOUNTER — Encounter: Payer: Self-pay | Admitting: Family Medicine

## 2016-12-22 VITALS — BP 118/84 | Temp 98.8°F | Ht 67.0 in | Wt 214.0 lb

## 2016-12-22 DIAGNOSIS — K645 Perianal venous thrombosis: Secondary | ICD-10-CM

## 2016-12-22 DIAGNOSIS — K644 Residual hemorrhoidal skin tags: Secondary | ICD-10-CM | POA: Diagnosis not present

## 2016-12-22 DIAGNOSIS — K5900 Constipation, unspecified: Secondary | ICD-10-CM | POA: Diagnosis not present

## 2016-12-22 NOTE — Patient Instructions (Signed)
WE NOW OFFER   [facility] FAST TRACK!!!  SAME DAY Appointments for ACUTE CARE  Such as: Sprains, Injuries, cuts, abrasions, rashes, muscle pain, joint pain, back pain Colds, flu, sore throats, headache, allergies, cough, fever  Ear pain, sinus and eye infections Abdominal pain, nausea, vomiting, diarrhea, upset stomach Animal/insect bites  3 Easy Ways to Schedule: Walk-In Scheduling Call in scheduling Mychart Sign-up: [email or web address]         

## 2016-12-22 NOTE — Progress Notes (Signed)
   Subjective:    Patient ID: [name], female    DOB: [birth year], 36 y.o.   MRN: [id number]  HPI Here for 2 weeks of a swollen painful hemorrhoid. No bleeding. She thinks a few days of constipation is what caused this. She had to strain to pass some hard stools. She took a stool softener and now her BMs are normal again.    Review of Systems  Constitutional: Negative.   Respiratory: Negative.   Cardiovascular: Negative.   Gastrointestinal: Positive for constipation and rectal pain. Negative for abdominal distention, abdominal pain, anal bleeding, blood in stool, diarrhea, nausea and vomiting.       Objective:   Physical Exam  Constitutional: She appears well-developed and well-nourished.  Cardiovascular: Normal rate, regular rhythm, normal heart sounds and intact distal pulses.   Pulmonary/Chest: Effort normal and breath sounds normal.  Abdominal: Soft. Bowel sounds are normal. She exhibits no distension and no mass. There is no tenderness. There is no rebound and no guarding.  Genitourinary:  Genitourinary Comments: Single thrombosed external hemorrhoid          Assessment & Plan:  Thrombosed hemorrhoid. This was lanced with a scalpel and the thrombus was removed. She will use warm Sitz baths. Use Miralax regularly to keep the stools soft.  Alysia Penna, MD

## 2017-01-19 ENCOUNTER — Other Ambulatory Visit: Payer: Self-pay | Admitting: Family Medicine

## 2017-01-19 NOTE — Telephone Encounter (Signed)
Electronic refill request. Methylphenidate Last office visit:   09/29/16 Last Filled:    180 tablet 0 06/05/2016  Please advise.

## 2017-01-20 MED ORDER — METHYLPHENIDATE HCL 5 MG PO TABS: 10.0000 mg | ORAL_TABLET | Freq: Two times a day (BID) | ORAL | 0 refills | Status: DC

## 2017-01-20 NOTE — Telephone Encounter (Signed)
Left detailed message on voicemail.  Medication phoned to pharmacy.

## 2017-01-20 NOTE — Telephone Encounter (Signed)
Printed.  Thanks.  

## 2017-02-06 ENCOUNTER — Other Ambulatory Visit: Payer: Self-pay | Admitting: Family Medicine

## 2017-02-09 ENCOUNTER — Encounter: Payer: Self-pay | Admitting: Family Medicine

## 2017-02-09 NOTE — Telephone Encounter (Signed)
Received refill electronically Last refill 12/08/16 #30/1 Last office visit 12/22/16/acute

## 2017-02-10 NOTE — Telephone Encounter (Signed)
Please call in.  Thanks.   

## 2017-02-10 NOTE — Telephone Encounter (Signed)
Rx called in to requested pharmacy 

## 2017-02-11 ENCOUNTER — Ambulatory Visit (INDEPENDENT_AMBULATORY_CARE_PROVIDER_SITE_OTHER): Payer: 59 | Admitting: Family Medicine

## 2017-02-11 DIAGNOSIS — M7711 Lateral epicondylitis, right elbow: Secondary | ICD-10-CM

## 2017-02-12 ENCOUNTER — Encounter: Payer: Self-pay | Admitting: Family Medicine

## 2017-02-12 DIAGNOSIS — M7710 Lateral epicondylitis, unspecified elbow: Secondary | ICD-10-CM | POA: Insufficient documentation

## 2017-02-12 NOTE — Assessment & Plan Note (Signed)
Likely. Discussed with patient. Reasonable to ice her elbow, 5 minutes on 5 minutes off, use tennis elbow strap. Anatomy and rationale discussed with patient. Unclear if the numbness intermittently noted in the right fourth and fifth fingers is due to ulnar nerve compression at the elbow versus another issue such as carpal tunnel. Phalen and Tinel are negative. Reasonable to treat for tennis elbow now, avoid prolonged compression of the elbow, ie on a hard countertop at work, and update me as needed. She agrees.

## 2017-02-12 NOTE — Progress Notes (Signed)
EMR was down at time of office visit. Documentation done after the fact.  Right elbow pain for about a month. She had a lot of repetitive motion at work. Pain with movement. Right-handed. She previously had some pain that radiated from the shoulder down to the hand. That has gotten better except for the pain that remains at the right elbow. She occasionally has some right fourth and fifth finger numbness.  No trauma. Intensity of symptoms is variable. No left arm symptoms. No weakness. She's been using an over-the-counter liniment on the elbow with a little bit help. No fevers chills nausea vomiting diarrhea rash.  She is on hormone replacement per gynecology status help with hot flashes. Per patient she had a conversation with the prescribing M.D. about the risks and benefits of hormone replacement.  She has a history of a tibial plateau fracture and pain with walking. Parking form done, given to patient.   Meds, vitals, and allergies reviewed.   ROS: Per HPI unless specifically indicated in ROS section   nad ncat Neck supple, no LA, normal ROM w/o inducing R arm sx.  Normal range of motion right shoulder elbow and wrist. She has pain at the right elbow laterally with resisted range of motion suggestive of tennis elbow. This is improved with compression of the forearm. Distally neurovascularly intact. Normal sensation in the hands. Phalen and Tinel negative. DTR wnl  rrr ctab

## 2017-03-23 ENCOUNTER — Other Ambulatory Visit: Payer: Self-pay | Admitting: Family Medicine

## 2017-03-23 NOTE — Telephone Encounter (Signed)
Electronic refill request. Tizanidine Last office visit:   02/11/17 Last Filled:    60 tablet 2 11/05/2016  Please advise.

## 2017-03-24 NOTE — Telephone Encounter (Signed)
Sent. Thanks.   

## 2017-03-30 ENCOUNTER — Other Ambulatory Visit: Payer: Self-pay | Admitting: Family Medicine

## 2017-03-30 NOTE — Telephone Encounter (Signed)
Electronic refill request. Methylphenidate Last office visit:   02/11/17 Last Filled:    180 tablet 0 01/20/2017  Please advise.

## 2017-04-01 MED ORDER — METHYLPHENIDATE HCL 5 MG PO TABS: 10.0000 mg | ORAL_TABLET | Freq: Two times a day (BID) | ORAL | 0 refills | Status: DC

## 2017-04-01 NOTE — Telephone Encounter (Signed)
Printed and in CMA box 

## 2017-04-01 NOTE — Telephone Encounter (Signed)
Spoke with pt notifying her rx is ready to pick up. Placed rx at front office.

## 2017-04-01 NOTE — Telephone Encounter (Signed)
I am out of the office and I would appreciate you printing and signing this rx.  Thanks.

## 2017-04-01 NOTE — Telephone Encounter (Signed)
Correction- rerouted to [employer].

## 2017-04-08 ENCOUNTER — Other Ambulatory Visit: Payer: Self-pay | Admitting: Family Medicine

## 2017-04-09 NOTE — Telephone Encounter (Signed)
Electronic refill request. Zolpidem Last office visit:   02/11/17 Last Filled:    30 tablet 1 02/10/2017  Please advise.

## 2017-04-10 ENCOUNTER — Encounter: Payer: Self-pay | Admitting: Family Medicine

## 2017-04-10 ENCOUNTER — Ambulatory Visit (INDEPENDENT_AMBULATORY_CARE_PROVIDER_SITE_OTHER): Payer: 59 | Admitting: Family Medicine

## 2017-04-10 VITALS — BP 110/82 | HR 94 | Temp 98.7°F | Wt 213.5 lb

## 2017-04-10 DIAGNOSIS — J302 Other seasonal allergic rhinitis: Secondary | ICD-10-CM | POA: Diagnosis not present

## 2017-04-10 DIAGNOSIS — L02419 Cutaneous abscess of limb, unspecified: Secondary | ICD-10-CM | POA: Diagnosis not present

## 2017-04-10 MED ORDER — FLUTICASONE PROPIONATE 50 MCG/ACT NA SUSP: 2.0000 | Freq: Every day | NASAL | 6 refills | Status: DC

## 2017-04-10 MED ORDER — MUPIROCIN 2 % EX OINT: 1.0000 "application " | TOPICAL_OINTMENT | Freq: Three times a day (TID) | CUTANEOUS | 1 refills | Status: DC

## 2017-04-10 NOTE — Telephone Encounter (Signed)
Please call in.  Thanks.   

## 2017-04-10 NOTE — Progress Notes (Signed)
Subjective:    Patient ID: [name], female    DOB: 15-Feb-1981, 36 y.o.   MRN: [id number]  HPI This is a 36 yo female who presents today with a lump under her left arm. Noticed when she took a shower this morning. No recent fevers. Has also noticed some enlarged nodes at base of neck.  She is having nasal congestion, sore throat, ears feel wet. Some pressure behind eyes. Occasional cough, productive in morning, daytime is dry. Has been taking sudafed and Xyzal. Some temporary relief. Spring and fall allergies typically.     Past Medical History:  Diagnosis Date  . ADD (attention deficit disorder)    testing done 2016  . ALLERGIC RHINITIS 10/11/2009  . Allergy to latex 04/11/2010  . ANKLE PAIN, RIGHT 04/03/2009  . BACK PAIN 03/19/2009  . COMMON MIGRAINE 03/19/2009  . FLANK PAIN, RIGHT 05/11/2009  . GLUCOSE INTOLERANCE 03/19/2009  . HYDRONEPHROSIS, RIGHT 05/11/2009  . INSOMNIA-SLEEP DISORDER-UNSPEC 03/19/2009  . Left tibial fracture 2018  . NEPHROLITHIASIS, HX OF 03/19/2009  . RENAL CALCULUS, RIGHT 05/11/2009   Past Surgical History:  Procedure Laterality Date  . ABDOMINAL HYSTERECTOMY    . APPENDECTOMY  1994  . CYSTOSCOPY W/ URETERAL STENT PLACEMENT Left 09/24/2014   Procedure: CYSTOSCOPY WITH RETROGRADE PYELOGRAM/LEFT URETERAL STENT PLACEMENT;  Surgeon: Raynelle Bring, MD;  Location: [facility];  Service: Urology;  Laterality: Left;  . EYE SURGERY Bilateral    lens replacement 2014  . hx of etopic pregnancy  2005  . LITHOTRIPSY  2011  . OOPHORECTOMY  07/2008  . s/p left knee arthroscopic  1994   Family History  Problem Relation Age of Onset  . Alcohol abuse Mother        ETOH  . Bipolar disorder Mother        manic depressive/bipolar  . Thyroid disease Mother        Low thyroid  . Hypertension Father   . Hypertension Other   . Diabetes Other   . Heart disease Other   . Stroke Other   . Cancer Other        Lung Cancer  . Colon cancer Maternal Grandmother   . Breast  cancer Paternal Grandmother    Social History  Substance Use Topics  . Smoking status: Former Smoker    Types: Cigarettes  . Smokeless tobacco: Never Used     Comment: quit with chantix 2010  . Alcohol use 0.0 oz/week     Comment: rare      Review of Systems Per HPi    Objective:   Physical Exam  Constitutional: She appears well-developed and well-nourished.  HENT:  Head: Normocephalic and atraumatic.  Right Ear: Tympanic membrane, external ear and ear canal normal.  Left Ear: Tympanic membrane, external ear and ear canal normal.  Nose: Mucosal edema present.  Mouth/Throat: Uvula is midline and mucous membranes are normal. Posterior oropharyngeal erythema present. No oropharyngeal exudate, posterior oropharyngeal edema or tonsillar abscesses.  Eyes: Conjunctivae are normal.  Neck: Normal range of motion. Neck supple.  Cardiovascular: Normal rate, regular rhythm and normal heart sounds.   Pulmonary/Chest: Effort normal and breath sounds normal.  Lymphadenopathy:    She has no cervical adenopathy.  Skin: Skin is warm and dry.  Left axilla with 1 cm area firmness, no erythema or drainage, tender to palpation.       BP 110/82 (BP Location: Left Arm, Patient Position: Sitting, Cuff Size: Normal)   Pulse 94   Temp 98.7  F (37.1 C) (Oral)   Wt 213 lb 8 oz (96.8 kg)   SpO2 98%   BMI 33.44 kg/m  Wt Readings from Last 3 Encounters:  04/10/17 213 lb 8 oz (96.8 kg)  02/12/17 211 lb 12.8 oz (96.1 kg)  12/22/16 214 lb (97.1 kg)       Assessment & Plan:  1. Axillary abscess - area small, has decreased some today, will treat with topical antibiotic, warm compresses, she will let me know if no improvement or worsening - mupirocin ointment (BACTROBAN) 2 %; Apply 1 application topically 3 (three) times daily.  Dispense: 22 g; Refill: 1 - Provided written and verbal information regarding diagnosis and treatment.  2. Seasonal allergic rhinitis, unspecified trigger - provided  written and verbal instructions for use of fluticasone, can use Afrin nasal spray twice a day for 3 days prior to fluticasone - continue antihistamine - fluticasone (FLONASE) 50 MCG/ACT nasal spray; Place 2 sprays into both nostrils daily.  Dispense: 16 g; Refill: [place], FNP-BC  [facility], [place]  04/13/2017 9:38 PM

## 2017-04-10 NOTE — Patient Instructions (Addendum)
I have sent in fluticosone nasal spray to your pharmacy, please use Afrin (generic) nasal spray twice a day for 3 days followed by fluticasone. Then use fluticasone only, once a day at bed time.  If underarm swelling gets worse, please let me know.    Skin Abscess A skin abscess is an infected area on or under your skin that contains a collection of pus and other material. An abscess may also be called a furuncle, carbuncle, or boil. An abscess can occur in or on almost any part of your body. Some abscesses break open (rupture) on their own. Most continue to get worse unless they are treated. The infection can spread deeper into the body and eventually into your blood, which can make you feel ill. Treatment usually involves draining the abscess. What are the causes? An abscess occurs when germs, often bacteria, pass through your skin and cause an infection. This may be caused by:  A scrape or cut on your skin.  A puncture wound through your skin, including a needle injection.  Blocked oil or sweat glands.  Blocked and infected hair follicles.  A cyst that forms beneath your skin (sebaceous cyst) and becomes infected.  What increases the risk? This condition is more likely to develop in people who:  Have a weak body defense system (immune system).  Have diabetes.  Have dry and irritated skin.  Get frequent injections or use illegal IV drugs.  Have a foreign body in a wound, such as a splinter.  Have problems with their lymph system or veins.  What are the signs or symptoms? An abscess may start as a painful, firm bump under the skin. Over time, the abscess may get larger or become softer. Pus may appear at the top of the abscess, causing pressure and pain. It may eventually break through the skin and drain. Other symptoms include:  Redness.  Warmth.  Swelling.  Tenderness.  A sore on the skin.  How is this diagnosed? This condition is diagnosed based on your medical  history and a physical exam. A sample of pus may be taken from the abscess to find out what is causing the infection and what antibiotics can be used to treat it. You also may have:  Blood tests to look for signs of infection or spread of an infection to your blood.  Imaging studies such as ultrasound, CT scan, or MRI if the abscess is deep.  How is this treated? Small abscesses that drain on their own may not need treatment. Treatment for an abscess that does not rupture on its own may include:  Warm compresses applied to the area several times per day.  Incision and drainage. Your health care provider will make an incision to open the abscess and will remove pus and any foreign body or dead tissue. The incision area may be packed with gauze to keep it open for a few days while it heals.  Antibiotic medicines to treat infection. For a severe abscess, you may first get antibiotics through an IV and then change to oral antibiotics.  Follow these instructions at home: Abscess Care  If you have an abscess that has not drained, place a warm, clean, wet washcloth over the abscess several times a day. Do this as told by your health care provider.  Follow instructions from your health care provider about how to take care of your abscess. Make sure you: ? Cover the abscess with a bandage (dressing). ? Change your dressing or  gauze as told by your health care provider. ? Wash your hands with soap and water before you change the dressing or gauze. If soap and water are not available, use hand sanitizer.  Check your abscess every day for signs of a worsening infection. Check for: ? More redness, swelling, or pain. ? More fluid or blood. ? Warmth. ? More pus or a bad smell. Medicines  Take over-the-counter and prescription medicines only as told by your health care provider.  If you were prescribed an antibiotic medicine, take it as told by your health care provider. Do not stop taking the  antibiotic even if you start to feel better. General instructions  To avoid spreading the infection: ? Do not share personal care items, towels, or hot tubs with others. ? Avoid making skin contact with other people.  Keep all follow-up visits as told by your health care provider. This is important. Contact a health care provider if:  You have more redness, swelling, or pain around your abscess.  You have more fluid or blood coming from your abscess.  Your abscess feels warm to the touch.  You have more pus or a bad smell coming from your abscess.  You have a fever.  You have muscle aches.  You have chills or a general ill feeling. Get help right away if:  You have severe pain.  You see red streaks on your skin spreading away from the abscess. This information is not intended to replace advice given to you by your health care provider. Make sure you discuss any questions you have with your health care provider. Document Released: 04/02/2005 Document Revised: 02/17/2016 Document Reviewed: 05/02/2015 Elsevier Interactive Patient Education  [employer].

## 2017-04-10 NOTE — Telephone Encounter (Signed)
Medication phoned to pharmacy.  

## 2017-04-15 ENCOUNTER — Telehealth: Payer: 59 | Admitting: Nurse Practitioner

## 2017-04-15 DIAGNOSIS — J019 Acute sinusitis, unspecified: Secondary | ICD-10-CM

## 2017-04-15 MED ORDER — LEVOFLOXACIN 500 MG PO TABS: 750.0000 mg | ORAL_TABLET | Freq: Every day | ORAL | 0 refills | Status: AC

## 2017-04-15 MED ORDER — BENZONATATE 100 MG PO CAPS: 100.0000 mg | ORAL_CAPSULE | Freq: Three times a day (TID) | ORAL | 0 refills | Status: AC | PRN

## 2017-04-15 NOTE — Progress Notes (Signed)
We are sorry that you are not feeling well.  Here is how we plan to help!  Based on what you have shared with me it looks like you have sinusitis.  Sinusitis is inflammation and infection in the sinus cavities of the head.  Based on your presentation I believe you most likely have Acute Bacterial Sinusitis.This is an infection most likely caused by a bacteria.  You may use an oral decongestant such as Mucinex D or if you have glaucoma or high blood pressure use plain Mucinex. Saline nasal spray help and can safely be used as often as needed for congestion, I have prescribed: Fluticasone nasal spray two sprays in each nostril twice a day.  I am also prescribing Levofloxacin 750mg  once daily for five days and Tessalon Perles 100mg  up to three times daily for ten days.  Some authorities believe that zinc sprays or the use of Echinacea may shorten the course of your symptoms.  Sinus infections are not as easily transmitted as other respiratory infection, however we still recommend that you avoid close contact with loved ones, especially the very young and elderly.  Remember to wash your hands thoroughly throughout the day as this is the number one way to prevent the spread of infection!  Home Care:  Only take medications as instructed by your medical team.  Complete the entire course of an antibiotic.  Do not take these medications with alcohol.  A steam or ultrasonic humidifier can help congestion.  You can place a towel over your head and breathe in the steam from hot water coming from a faucet.  Avoid close contacts especially the very young and the elderly.  Cover your mouth when you cough or sneeze.  Always remember to wash your hands.  Get Help Right Away If:  You develop worsening fever or sinus pain.  You develop a severe head ache or visual changes.  Your symptoms persist after you have completed your treatment plan.  Make sure you  Understand these instructions.  Will watch  your condition.  Will get help right away if you are not doing well or get worse.  Your e-visit answers were reviewed by a board certified advanced clinical practitioner to complete your personal care plan.  Depending on the condition, your plan could have included both over the counter or prescription medications.  If there is a problem please reply  once you have received a response from your provider.  Your safety is important to [place].  If you have drug allergies check your prescription carefully.    You can use MyChart to ask questions about today's visit, request a non-urgent call back, or ask for a work or school excuse for 24 hours related to this e-Visit. If it has been greater than 24 hours you will need to follow up with your provider, or enter a new e-Visit to address those concerns.  You will get an e-mail in the next two days asking about your experience.  I hope that your e-visit has been valuable and will speed your recovery. Thank you for using e-visits.

## 2017-04-22 ENCOUNTER — Emergency Department (HOSPITAL_COMMUNITY): Payer: 59

## 2017-04-22 ENCOUNTER — Emergency Department (HOSPITAL_COMMUNITY)
Admission: EM | Admit: 2017-04-22 | Discharge: 2017-04-22 | Disposition: A | Discharge status: Home / Self Care | Payer: 59 | Attending: Emergency Medicine | Admitting: Emergency Medicine

## 2017-04-22 ENCOUNTER — Encounter (HOSPITAL_COMMUNITY): Payer: Self-pay

## 2017-04-22 DIAGNOSIS — Z87891 Personal history of nicotine dependence: Secondary | ICD-10-CM | POA: Diagnosis not present

## 2017-04-22 DIAGNOSIS — Z91040 Latex allergy status: Secondary | ICD-10-CM | POA: Diagnosis not present

## 2017-04-22 DIAGNOSIS — Z79899 Other long term (current) drug therapy: Secondary | ICD-10-CM | POA: Insufficient documentation

## 2017-04-22 DIAGNOSIS — R002 Palpitations: Secondary | ICD-10-CM | POA: Insufficient documentation

## 2017-04-22 DIAGNOSIS — R109 Unspecified abdominal pain: Secondary | ICD-10-CM | POA: Insufficient documentation

## 2017-04-22 DIAGNOSIS — R319 Hematuria, unspecified: Secondary | ICD-10-CM | POA: Insufficient documentation

## 2017-04-22 DIAGNOSIS — R110 Nausea: Secondary | ICD-10-CM | POA: Diagnosis not present

## 2017-04-22 LAB — COMPREHENSIVE METABOLIC PANEL
ALK PHOS: 109 U/L (ref 38–126)
ALT: 42 U/L (ref 14–54)
AST: 36 U/L (ref 15–41)
Albumin: 3.8 g/dL (ref 3.5–5.0)
Anion gap: 9 (ref 5–15)
BUN: 15 mg/dL (ref 6–20)
CALCIUM: 9.2 mg/dL (ref 8.9–10.3)
CHLORIDE: 103 mmol/L (ref 101–111)
CO2: 26 mmol/L (ref 22–32)
CREATININE: 0.8 mg/dL (ref 0.44–1.00)
Glucose, Bld: 89 mg/dL (ref 65–99)
Potassium: 4.5 mmol/L (ref 3.5–5.1)
SODIUM: 138 mmol/L (ref 135–145)
Total Bilirubin: 0.4 mg/dL (ref 0.3–1.2)
Total Protein: 6.9 g/dL (ref 6.5–8.1)

## 2017-04-22 LAB — CBC WITH DIFFERENTIAL/PLATELET
BASOS ABS: 0.1 10*3/uL (ref 0.0–0.1)
Basophils Relative: 1 %
EOS ABS: 0.5 10*3/uL (ref 0.0–0.7)
EOS PCT: 4 %
HCT: 35.5 % — ABNORMAL LOW (ref 36.0–46.0)
Hemoglobin: 12 g/dL (ref 12.0–15.0)
LYMPHS PCT: 46 %
Lymphs Abs: 5.4 10*3/uL — ABNORMAL HIGH (ref 0.7–4.0)
MCH: 28.8 pg (ref 26.0–34.0)
MCHC: 33.8 g/dL (ref 30.0–36.0)
MCV: 85.1 fL (ref 78.0–100.0)
MONO ABS: 0.8 10*3/uL (ref 0.1–1.0)
Monocytes Relative: 7 %
Neutro Abs: 5 10*3/uL (ref 1.7–7.7)
Neutrophils Relative %: 42 %
PLATELETS: 341 10*3/uL (ref 150–400)
RBC: 4.17 MIL/uL (ref 3.87–5.11)
RDW: 12.6 % (ref 11.5–15.5)
WBC: 11.7 10*3/uL — ABNORMAL HIGH (ref 4.0–10.5)

## 2017-04-22 LAB — URINALYSIS, ROUTINE W REFLEX MICROSCOPIC
Bilirubin Urine: NEGATIVE
GLUCOSE, UA: NEGATIVE mg/dL
Ketones, ur: NEGATIVE mg/dL
Leukocytes, UA: NEGATIVE
NITRITE: NEGATIVE
Protein, ur: NEGATIVE mg/dL
SPECIFIC GRAVITY, URINE: 1.013 (ref 1.005–1.030)
WBC, UA: NONE SEEN WBC/hpf (ref 0–5)
pH: 7 (ref 5.0–8.0)

## 2017-04-22 LAB — PREGNANCY, URINE: Preg Test, Ur: NEGATIVE

## 2017-04-22 MED ORDER — HYDROCODONE-ACETAMINOPHEN 5-325 MG PO TABS: 1.0000 | ORAL_TABLET | ORAL | 0 refills | Status: DC | PRN

## 2017-04-22 MED ORDER — ONDANSETRON HCL 4 MG/2ML IJ SOLN
4.0000 mg | Freq: Once | INTRAMUSCULAR | Status: AC | PRN
  Administered 2017-04-22: 4 mg via INTRAVENOUS
  Filled 2017-04-22: qty 2

## 2017-04-22 MED ORDER — TAMSULOSIN HCL 0.4 MG PO CAPS: 0.4000 mg | ORAL_CAPSULE | Freq: Every day | ORAL | 0 refills | Status: DC

## 2017-04-22 MED ORDER — MORPHINE SULFATE (PF) 2 MG/ML IV SOLN
2.0000 mg | INTRAVENOUS | Status: DC | PRN
  Administered 2017-04-22: 2 mg via INTRAVENOUS
  Filled 2017-04-22: qty 1

## 2017-04-22 MED ORDER — HYDROCODONE-ACETAMINOPHEN 5-325 MG PO TABS
1.0000 | ORAL_TABLET | Freq: Once | ORAL | Status: AC
  Administered 2017-04-22: 1 via ORAL
  Filled 2017-04-22: qty 1

## 2017-04-22 MED ORDER — ONDANSETRON 4 MG PO TBDP: 4.0000 mg | ORAL_TABLET | Freq: Three times a day (TID) | ORAL | 0 refills | Status: DC | PRN

## 2017-04-22 NOTE — Discharge Instructions (Signed)
Please follow up with your urologist and your PCP.   Today you received medications that may make you sleepy or impair your ability to make decisions.  For the next 24 hours please do not drive, operate heavy machinery, care for a small child with out another adult present, or perform any activities that may cause harm to you or someone else if you were to fall asleep or be impaired.   You are being prescribed a medication which may make you sleepy. Please follow up of listed precautions for at least 24 hours after taking one dose.

## 2017-04-22 NOTE — ED Notes (Signed)
Bed: [facility] Expected date:  Expected time:  Means of arrival:  Comments: 

## 2017-04-22 NOTE — ED Notes (Signed)
ED Provider at bedside. 

## 2017-04-22 NOTE — ED Provider Notes (Signed)
[facility] DEPT Provider Note   CSN: [id number] Arrival date & time: 04/22/17  1042     History   Chief Complaint Chief Complaint  Patient presents with  . Palpitations  . Flank Pain    HPI [name] is a 36 y.o. female with a history of kidney stones who presents today for evaluation of palpitations and left flank pain.  She reports that she donated blood three days ago and since then has been experiencing intermittent palpitations. She reports that they get worse when she gets up and moves around. She also reports that she has been having occasional ringing in her ears. She denies taking any aspirin-like products. She states that normally she drinks red bull, however recently she has not been drinking nose and today has only had about one quarter of a cup of coffee.    She also reports that she has left flank pain that started this morning. She has a history of kidney stones and says that this feels like her previous kidney stones.  She denies any possibility of pregnancy, stating she has had a hysterectomy. She denies any fevers or chills, no dysuria. Occasional hematuria.  She also reports that recently she had a sinus infection for which she was treated with Levaquin She reports that she has completed her antibiotics and that is resolving.  HPI  Past Medical History:  Diagnosis Date  . ADD (attention deficit disorder)    testing done 2016  . ALLERGIC RHINITIS 10/11/2009  . Allergy to latex 04/11/2010  . ANKLE PAIN, RIGHT 04/03/2009  . BACK PAIN 03/19/2009  . COMMON MIGRAINE 03/19/2009  . FLANK PAIN, RIGHT 05/11/2009  . GLUCOSE INTOLERANCE 03/19/2009  . HYDRONEPHROSIS, RIGHT 05/11/2009  . INSOMNIA-SLEEP DISORDER-UNSPEC 03/19/2009  . Left tibial fracture 2018  . NEPHROLITHIASIS, HX OF 03/19/2009  . RENAL CALCULUS, RIGHT 05/11/2009    Patient Active Problem List   Diagnosis Date Noted  . Tennis elbow 02/12/2017  . Symptomatic postsurgical  menopause 11/20/2016  . Neck mass 09/30/2016  . BRBPR (bright red blood per rectum) 09/30/2016  . Encounter for general adult medical examination with abnormal findings 08/30/2015  . Pain in joint, shoulder region 08/30/2015  . Rash and nonspecific skin eruption 11/29/2014  . Ureteral stone 09/24/2014  . ADD (attention deficit disorder) 06/20/2014  . Obesity (BMI 30-39.9) 08/15/2013  . Stress incontinence 08/15/2013  . Anxiety state, unspecified 08/15/2013  . Chronic back pain 03/23/2013  . Nephrolithiasis 12/31/2012  . Unspecified constipation 12/29/2012  .  LATEX ALLERGY  04/11/2010  . ALLERGIC RHINITIS 10/11/2009  . COMMON MIGRAINE 03/19/2009  . Insomnia 03/19/2009    Past Surgical History:  Procedure Laterality Date  . ABDOMINAL HYSTERECTOMY    . APPENDECTOMY  1994  . CYSTOSCOPY W/ URETERAL STENT PLACEMENT Left 09/24/2014   Procedure: CYSTOSCOPY WITH RETROGRADE PYELOGRAM/LEFT URETERAL STENT PLACEMENT;  Surgeon: Raynelle Bring, MD;  Location: [facility];  Service: Urology;  Laterality: Left;  . EYE SURGERY Bilateral    lens replacement 2014  . hx of etopic pregnancy  2005  . LITHOTRIPSY  2011  . OOPHORECTOMY  07/2008  . s/p left knee arthroscopic  1994    OB History    No data available       Home Medications    Prior to Admission medications   Medication Sig Start Date End Date Taking? Authorizing Provider  benzonatate (TESSALON) 100 MG capsule Take 1 capsule (100 mg total) by mouth 3 (three) times daily as  needed for cough (cough). 04/15/17 04/25/17 Yes Kara Dies, NP  Cholecalciferol (VITAMIN D3) 1000 units CAPS Take 1 capsule (1,000 Units total) by mouth daily. 10/02/16  Yes Tonia Ghent, MD  DIVIGEL 1 MG/GM GEL Apply topically daily. as directed 03/13/17  Yes [provider]  EPINEPHrine 0.3 mg/0.3 mL IJ SOAJ injection Inject 0.3 mLs (0.3 mg total) into the muscle once. Okay to fill wit comparable epinephrine auto injector 08/29/15  Yes Tonia Ghent, MD  fluticasone [facility]) 50 MCG/ACT nasal spray Place 2 sprays into both nostrils daily. 04/10/17  Yes Elby Beck, FNP  Levocetirizine Dihydrochloride (XYZAL PO) Take by mouth.   Yes [provider]  methylphenidate (RITALIN) 5 MG tablet Take 2-3 tablets (10-15 mg total) by mouth 2 (two) times daily. 04/01/17  Yes Ria Bush, MD  tiZANidine (ZANAFLEX) 4 MG tablet TAKE 1 TABLET EVERY 6 HOURS AS NEEDED 03/24/17  Yes Tonia Ghent, MD  traMADol (ULTRAM) 50 MG tablet TAKE 1 TABLET BY MOUTH EVERY 6 HOURS AS NEEDED 12/08/16  Yes Tonia Ghent, MD  zolpidem (AMBIEN CR) 12.5 MG CR tablet TAKE 1 TABLET BY MOUTH EVERY DAY AT BEDTIME 04/10/17  Yes Tonia Ghent, MD  HYDROcodone-acetaminophen (NORCO/VICODIN) 5-325 MG tablet Take 1 tablet by mouth every 4 (four) hours as needed. 04/22/17   Lorin Glass, PA-C  mupirocin ointment (BACTROBAN) 2 % Apply 1 application topically 3 (three) times daily. Patient not taking: Reported on 04/22/2017 04/10/17   Elby Beck, FNP  ondansetron (ZOFRAN ODT) 4 MG disintegrating tablet Take 1 tablet (4 mg total) by mouth every 8 (eight) hours as needed for nausea or vomiting. 04/22/17   Lorin Glass, PA-C  tamsulosin (FLOMAX) 0.4 MG CAPS capsule Take 1 capsule (0.4 mg total) by mouth daily. 04/22/17   Lorin Glass, PA-C    Family History Family History  Problem Relation Age of Onset  . Alcohol abuse Mother        ETOH  . Bipolar disorder Mother        manic depressive/bipolar  . Thyroid disease Mother        Low thyroid  . Hypertension Father   . Hypertension Other   . Diabetes Other   . Heart disease Other   . Stroke Other   . Cancer Other        Lung Cancer  . Colon cancer Maternal Grandmother   . Breast cancer Paternal Grandmother     Social History Social History  Substance Use Topics  . Smoking status: Former Smoker    Types: Cigarettes  . Smokeless tobacco: Never Used     Comment: quit  with chantix 2010  . Alcohol use 0.0 oz/week     Comment: rare     Allergies   Amoxicillin-pot clavulanate; Latex; Aspirin; Eggs or egg-derived products; Ibuprofen; Nsaids; Other; Penicillins; and Red dye   Review of Systems Review of Systems  Constitutional: Negative for chills and fever.  HENT: Negative for congestion, ear pain and sore throat.   Eyes: Negative for pain and visual disturbance.  Respiratory: Negative for cough, chest tightness and shortness of breath.   Cardiovascular: Positive for palpitations. Negative for chest pain and leg swelling.  Gastrointestinal: Negative for abdominal pain and vomiting.  Genitourinary: Positive for flank pain and hematuria. Negative for difficulty urinating, dysuria, frequency, urgency, vaginal bleeding and vaginal discharge.  Musculoskeletal: Negative for arthralgias and back pain.  Skin: Negative for color change and rash.  Neurological: Negative for seizures, syncope and headaches.  All other systems reviewed and are negative.    Physical Exam Updated Vital Signs BP 120/60   Pulse 71   Temp 98.2 F (36.8 C) (Oral)   Resp 14   Ht 5\' 8"  (1.727 m)   Wt 95.3 kg (210 lb)   SpO2 100%   BMI 31.93 kg/m   Physical Exam  Constitutional: She appears well-developed and well-nourished. No distress.  HENT:  Head: Normocephalic and atraumatic.  Right Ear: External ear normal.  Left Ear: External ear normal.  Eyes: Conjunctivae are normal.  Neck: Neck supple.  Cardiovascular: Normal rate, regular rhythm, S1 normal, S2 normal, normal heart sounds and intact distal pulses.  Exam reveals no friction rub.   No murmur heard. Pulses:      Radial pulses are 2+ on the right side, and 2+ on the left side.       Posterior tibial pulses are 2+ on the right side, and 2+ on the left side.  No LE edema  Pulmonary/Chest: Effort normal and breath sounds normal. No respiratory distress. She has no wheezes. She exhibits no tenderness.  Abdominal:  Soft. Normal appearance and bowel sounds are normal. She exhibits no distension. There is no tenderness. There is CVA tenderness (left). There is no guarding.  Musculoskeletal: She exhibits no edema.  Neurological: She is alert.  Skin: Skin is warm and dry.  Psychiatric: She has a normal mood and affect.  Nursing note and vitals reviewed.    ED Treatments / Results  Labs (all labs ordered are listed, but only abnormal results are displayed) Labs Reviewed  URINALYSIS, ROUTINE W REFLEX MICROSCOPIC - Abnormal; Notable for the following:       Result Value   APPearance TURBID (*)    Hgb urine dipstick SMALL (*)    Bacteria, UA RARE (*)    Squamous Epithelial / LPF 0-5 (*)    All other components within normal limits  CBC WITH DIFFERENTIAL/PLATELET - Abnormal; Notable for the following:    WBC 11.7 (*)    HCT 35.5 (*)    Lymphs Abs 5.4 (*)    All other components within normal limits  COMPREHENSIVE METABOLIC PANEL  PREGNANCY, URINE  CBC WITH DIFFERENTIAL/PLATELET    EKG  EKG Interpretation  Date/Time:  Wednesday April 22 2017 18:42:35 EDT Ventricular Rate:  83 PR Interval:    QRS Duration: 89 QT Interval:  385 QTC Calculation: 453 R Axis:   66 Text Interpretation:  Sinus rhythm Low voltage, precordial leads Borderline T abnormalities, anterior leads Baseline wander in lead(s) V4 Since last tracing rate slower Confirmed by Dorie Rank [id number]) on 04/22/2017 6:47:03 PM       Radiology Dg Chest 2 View  Result Date: 04/22/2017 CLINICAL DATA:  Palpitations EXAM: CHEST  2 VIEW COMPARISON:  None. FINDINGS: The heart size and mediastinal contours are within normal limits. Both lungs are clear. The visualized skeletal structures are unremarkable. IMPRESSION: No active cardiopulmonary disease. Electronically Signed   By: Ulyses Jarred M.D.   On: 04/22/2017 19:33   Dg Abdomen 1 View  Result Date: 04/22/2017 CLINICAL DATA:  Flank pain history kidney stone EXAM: ABDOMEN - 1 VIEW  COMPARISON:  Abdominal radiograph 11/23/2015 FINDINGS: Large amount of stool in the colon. No radiopaque calculi are identified. No dilated small bowel. IMPRESSION: Negative. Electronically Signed   By: Ulyses Jarred M.D.   On: 04/22/2017 19:37    Procedures Procedures (including critical care time)  Medications  Ordered in ED Medications  morphine 2 MG/ML injection 2 mg (2 mg Intravenous Given 04/22/17 2013)  ondansetron [facility]) injection 4 mg (4 mg Intravenous Given 04/22/17 2013)  HYDROcodone-acetaminophen (NORCO/VICODIN) 5-325 MG per tablet 1 tablet (1 tablet Oral Given 04/22/17 2212)     Initial Impression / Assessment and Plan / ED Course  I have reviewed the triage vital signs and the nursing notes.  Pertinent labs & imaging results that were available during my care of the patient were reviewed by me and considered in my medical decision making (see chart for details).  Clinical Course as of Apr 22 2238  Wed Apr 22, 2017  2016 Called lab, CBC was clotted, will need redraw.   [EH]    Clinical Course User Index [EH] Lorin Glass, PA-C   Ottie Glazier presents for evaluation for left flank pain.  She has a history of kidney stones and feels like this is similar.  Her urine had small hemoglobin.  Labs were reassuring.  Given her history of kidney stones I feel that this is most likely a kidney stone.  Her pain was treated with morphine and her nausea was treated with Zofran.  No obvious signs of secondary infection.  She appears non toxic and generally well appearing.  She will be given PO pain and nausea medicines.  She reports that flomax has helped in the past so will rx that also.   Regarding her palpitations she does not appear to be anemic, had a reassuring EKG, normal CXR.  She is not having any chest pain or SOB.  She is not currently having any palpitations.  I recommend she follow up with her primary care provider regarding her palpations.     At this time there  does not appear to be any evidence of an acute emergency medical condition and the patient appears stable for discharge with appropriate outpatient follow up.Diagnosis was discussed with patient who verbalizes understanding and is agreeable to discharge. Pt case discussed with Dr. Hillard Danker who agrees with my plan.     Final Clinical Impressions(s) / ED Diagnoses   Final diagnoses:  Palpitations  Flank pain  Hematuria, unspecified type    New Prescriptions Discharge Medication List as of 04/22/2017  9:02 PM    START taking these medications   Details  HYDROcodone-acetaminophen (NORCO/VICODIN) 5-325 MG tablet Take 1 tablet by mouth every 4 (four) hours as needed., Starting Wed 04/22/2017, Print    ondansetron (ZOFRAN ODT) 4 MG disintegrating tablet Take 1 tablet (4 mg total) by mouth every 8 (eight) hours as needed for nausea or vomiting., Starting Wed 04/22/2017, Print    tamsulosin (FLOMAX) 0.4 MG CAPS capsule Take 1 capsule (0.4 mg total) by mouth daily., Starting Wed 04/22/2017, Print         Lorin Glass, PA-C 04/22/17 2249    Dorie Rank, MD 04/23/17 1332

## 2017-04-22 NOTE — ED Triage Notes (Signed)
Patient c/o intermittent palpitations x 3 days. Patient states the palpitations started after giving blood.   patient c/o left flank pain that started this AM. Patient has history of kidney stones

## 2017-04-22 NOTE — ED Notes (Signed)
Patient transported to X-ray 

## 2017-04-28 DIAGNOSIS — R1084 Generalized abdominal pain: Secondary | ICD-10-CM | POA: Diagnosis not present

## 2017-04-28 DIAGNOSIS — N200 Calculus of kidney: Secondary | ICD-10-CM | POA: Diagnosis not present

## 2017-05-20 ENCOUNTER — Other Ambulatory Visit: Payer: Self-pay | Admitting: Family Medicine

## 2017-05-20 NOTE — Telephone Encounter (Signed)
Electronic refill Last refill 03/24/17 #60/2 Last office visit 04/10/17/acute

## 2017-05-20 NOTE — Telephone Encounter (Signed)
Sent. Thanks.   

## 2017-05-25 IMAGING — US US THYROID
1 series · 13 of 25 positions shown · non-contrast
Comparison: None available

CLINICAL DATA: Neck mass, thyromegaly on exam

EXAM:
THYROID ULTRASOUND
TECHNIQUE: Ultrasound examination of the thyroid gland and adjacent soft
tissues was performed.

[Series 1: us thyroid · 0.04mm/px · 13 of 54 slices shown]
[im 1/54]
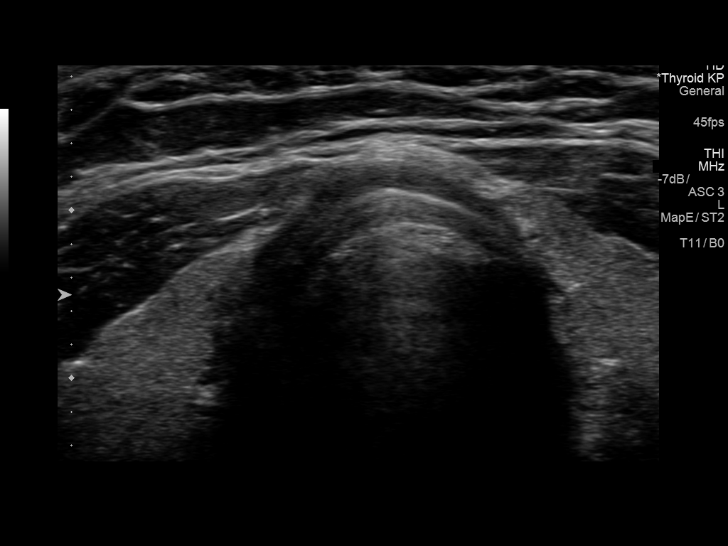
[im 5/54]
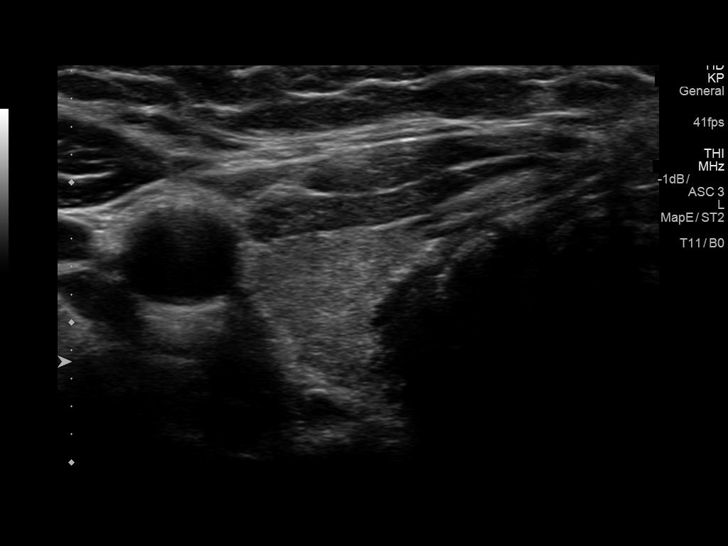
[im 9/54]
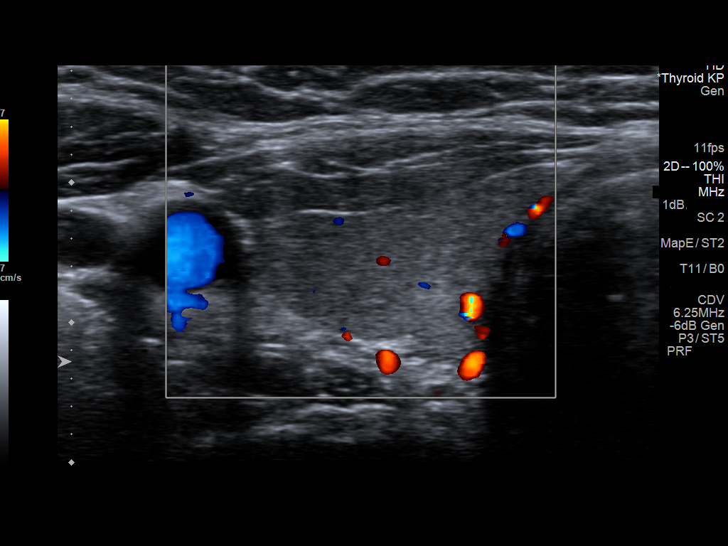
[im 14/54]
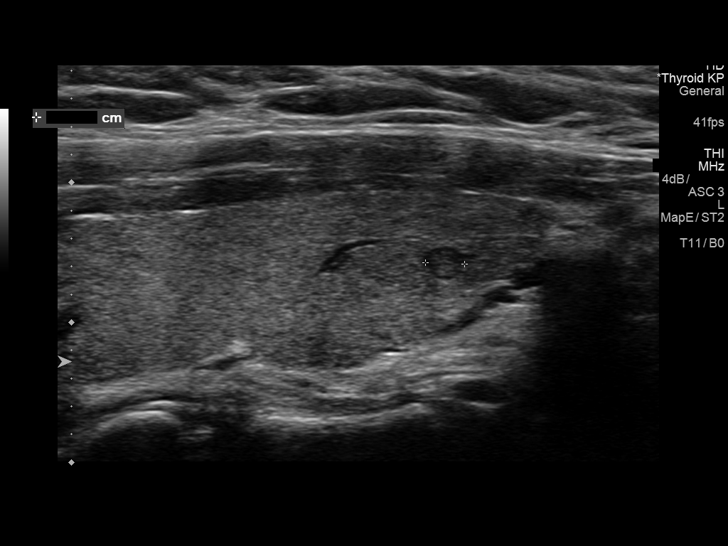
[im 18/54]
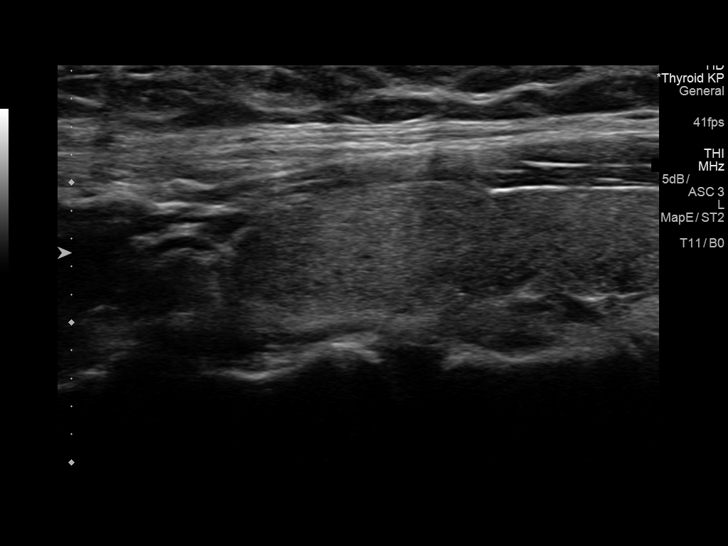
[im 23/54]
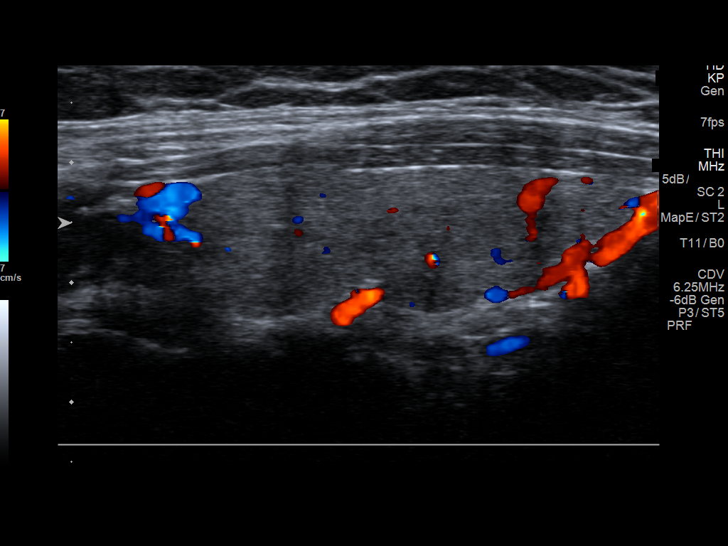
[im 27/54]
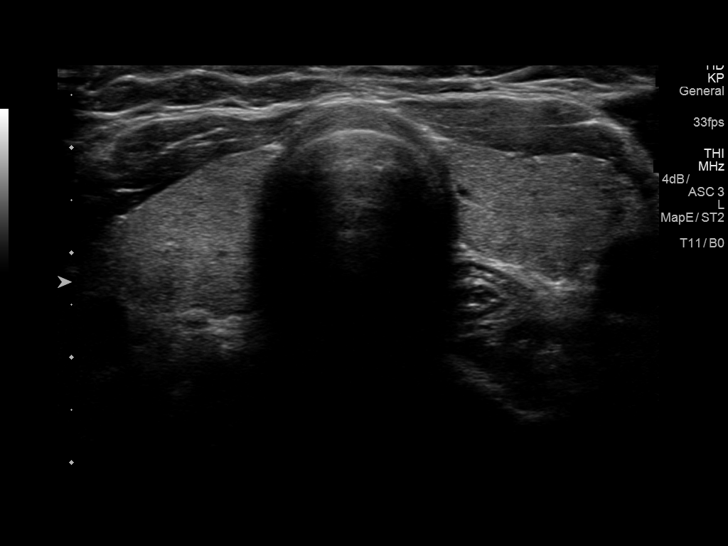
[im 31/54]
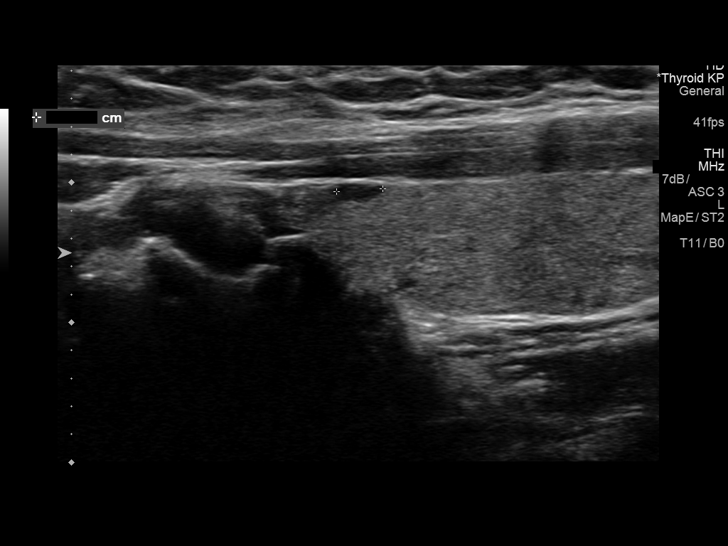
[im 36/54]
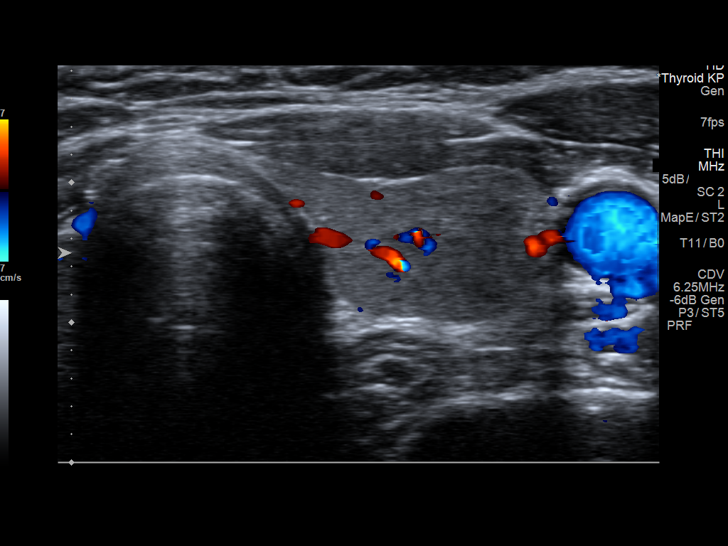
[im 40/54]
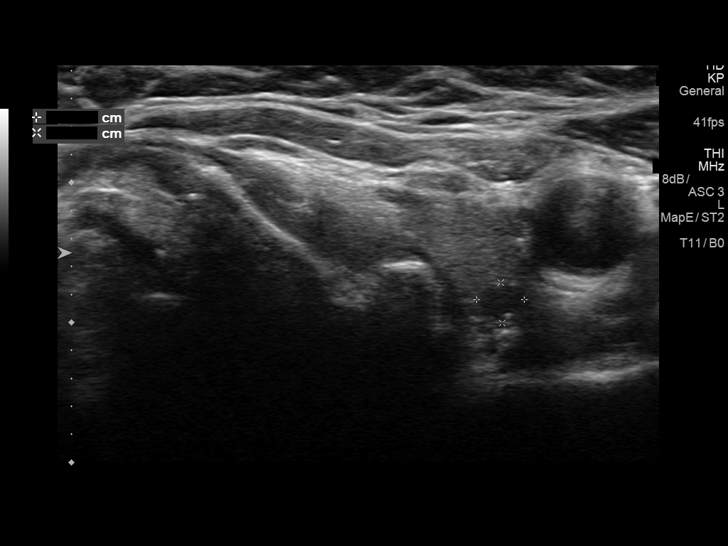
[im 45/54]
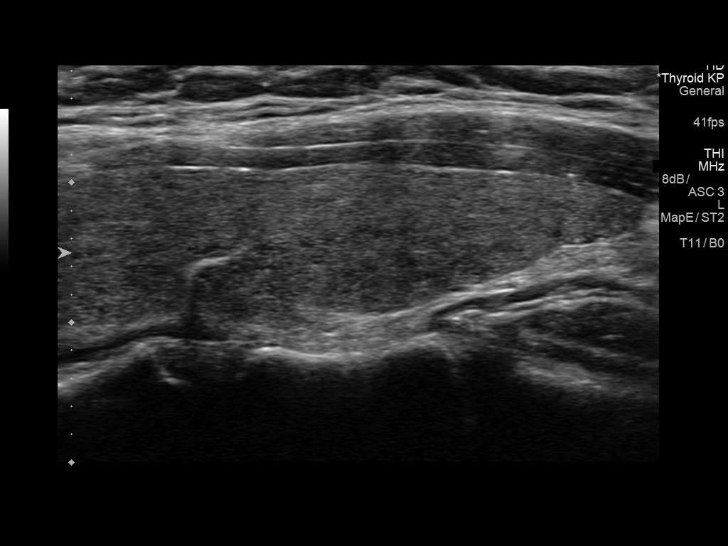
[im 49/54]
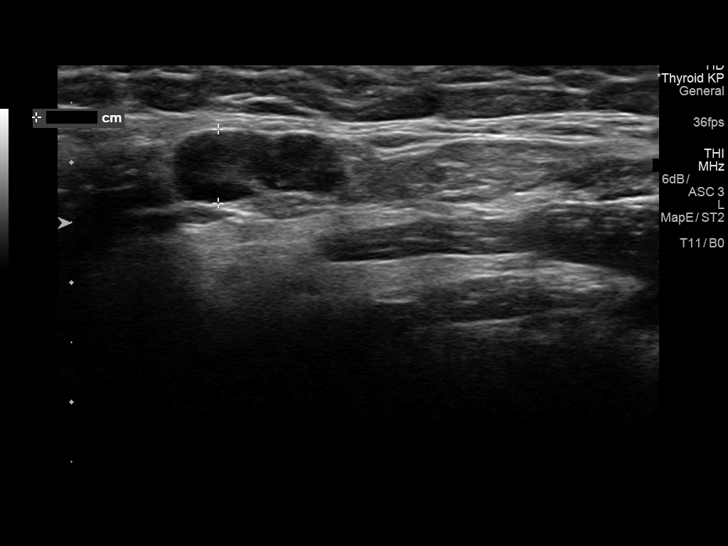
[im 54/54]
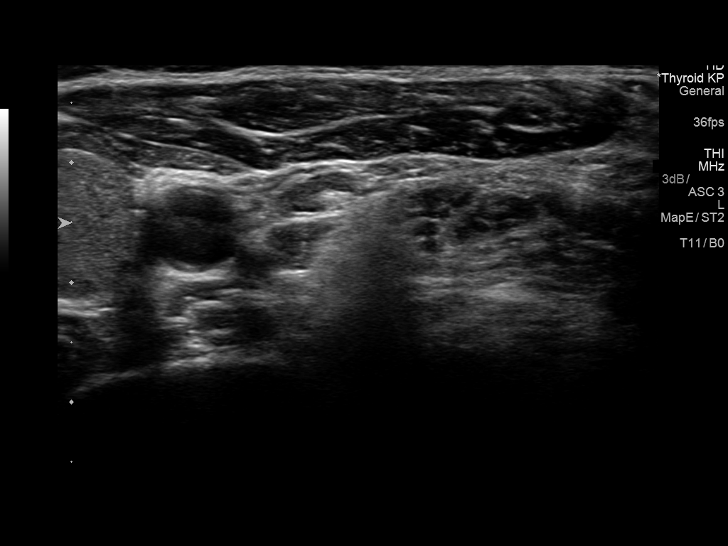

[13 of 25 positions shown; findings below may reference images not displayed]

FINDINGS: Parenchymal Echotexture: Mildly heterogenous

Isthmus: 1.2 cm

Right lobe: 4.1 x 1.2 x 1.9 cm

Left lobe: 4.9 x 1.2 x 1.8 cm

_________________________________________________________

Estimated total number of nodules >/= 1 cm: 0

Number of spongiform nodules >/=  2 cm not described below (TR1): 0

Number of mixed cystic and solid nodules >/= 1.5 cm not described
below (TR2): 0

_________________________________________________________

Mild thyroid heterogeneity with small hypoechoic subcentimeter
nodules bilaterally all measuring 4 mm or less in size, (1 on the
right and 2 on the left). These incidental subcentimeter tiny
nodules do not meet criteria for any follow-up biopsy.

Small benign-appearing lymph nodes bilaterally with short axis
measurements of 6 mm or less.
IMPRESSION: Nonspecific mild gland heterogeneity and tiny bilateral
subcentimeter hypoechoic nodules. See above comment.

No adenopathy

The above is in keeping with the ACR TI-RADS recommendations - [HOSPITAL] 0994;[DATE].

## 2017-06-01 ENCOUNTER — Other Ambulatory Visit: Payer: Self-pay | Admitting: Family Medicine

## 2017-06-01 NOTE — Telephone Encounter (Signed)
Last refill 04/01/17 #180 Last office visit 04/10/17/acute

## 2017-06-02 MED ORDER — METHYLPHENIDATE HCL 5 MG PO TABS: 10.0000 mg | ORAL_TABLET | Freq: Two times a day (BID) | ORAL | 0 refills | Status: DC

## 2017-06-02 NOTE — Telephone Encounter (Signed)
Printed.  Thanks.  

## 2017-06-02 NOTE — Telephone Encounter (Signed)
I don't have this Rx.

## 2017-06-02 NOTE — Telephone Encounter (Signed)
Patient advised.  Rx left at front desk for pick up. 

## 2017-06-07 ENCOUNTER — Other Ambulatory Visit: Payer: Self-pay | Admitting: Family Medicine

## 2017-06-08 NOTE — Telephone Encounter (Signed)
Electronic refill request. Zolpidem Last office visit:   04/10/17 Acute Last Filled:    30 tablet 1 04/10/2017  Please advise.

## 2017-06-09 ENCOUNTER — Other Ambulatory Visit: Payer: Self-pay | Admitting: *Deleted

## 2017-06-09 NOTE — Telephone Encounter (Signed)
Rx called to pharmacy as instructed. 

## 2017-06-09 NOTE — Telephone Encounter (Signed)
Please call in.  Thanks.   

## 2017-06-25 ENCOUNTER — Other Ambulatory Visit: Payer: Self-pay | Admitting: Family Medicine

## 2017-06-26 NOTE — Telephone Encounter (Signed)
Electronic refill Last refill 12/08/16 #120/1 Last office visit 04/10/17/acute

## 2017-06-28 NOTE — Telephone Encounter (Signed)
Please call in.  Thanks.   

## 2017-06-29 NOTE — Telephone Encounter (Signed)
Medication phoned to [facility] as instructed.  

## 2017-08-16 ENCOUNTER — Other Ambulatory Visit: Payer: Self-pay | Admitting: Family Medicine

## 2017-08-17 ENCOUNTER — Other Ambulatory Visit: Payer: Self-pay | Admitting: Family Medicine

## 2017-08-17 MED ORDER — METHYLPHENIDATE HCL 5 MG PO TABS: 10.0000 mg | ORAL_TABLET | Freq: Two times a day (BID) | ORAL | 0 refills | Status: DC

## 2017-08-17 NOTE — Telephone Encounter (Signed)
Electronic refill Last refill 06/09/17 #30/1 Last office visit 04/10/17 acute

## 2017-08-17 NOTE — Telephone Encounter (Signed)
Sent.  Needs CPE scheduled.  Thanks.  

## 2017-08-17 NOTE — Telephone Encounter (Signed)
Electronic refill request. Methylphenidate Last office visit:   CPE  09/29/16 Last Filled:    180 tablet 0 06/02/2017  Please advise.

## 2017-08-17 NOTE — Telephone Encounter (Signed)
Sent. Thanks.   

## 2017-08-18 ENCOUNTER — Encounter: Payer: Self-pay | Admitting: *Deleted

## 2017-08-18 NOTE — Telephone Encounter (Signed)
Letter mailed

## 2017-08-26 ENCOUNTER — Ambulatory Visit: Payer: 59 | Admitting: Family Medicine

## 2017-08-26 ENCOUNTER — Encounter: Payer: Self-pay | Admitting: Family Medicine

## 2017-08-26 VITALS — BP 128/84 | HR 123 | Temp 98.2°F | Wt 213.5 lb

## 2017-08-26 DIAGNOSIS — G43109 Migraine with aura, not intractable, without status migrainosus: Secondary | ICD-10-CM

## 2017-08-26 MED ORDER — BUTALBITAL-APAP-CAFFEINE 50-325-40 MG PO TABS: 1.0000 | ORAL_TABLET | Freq: Four times a day (QID) | ORAL | 0 refills | Status: DC | PRN

## 2017-08-26 MED ORDER — KETOROLAC TROMETHAMINE 60 MG/2ML IM SOLN
60.0000 mg | Freq: Once | INTRAMUSCULAR | Status: AC
  Administered 2017-08-26: 60 mg via INTRAMUSCULAR

## 2017-08-26 NOTE — Patient Instructions (Signed)
Please keep a headache journal   Migraine Headache A migraine headache is an intense, throbbing pain on one side or both sides of the head. Migraines may also cause other symptoms, such as nausea, vomiting, and sensitivity to light and noise. What are the causes? Doing or taking certain things may also trigger migraines, such as:  Alcohol.  Smoking.  Medicines, such as: ? Medicine used to treat chest pain (nitroglycerine). ? Birth control pills. ? Estrogen pills. ? Certain blood pressure medicines.  Aged cheeses, chocolate, or caffeine.  Foods or drinks that contain nitrates, glutamate, aspartame, or tyramine.  Physical activity.  Other things that may trigger a migraine include:  Menstruation.  Pregnancy.  Hunger.  Stress, lack of sleep, too much sleep, or fatigue.  Weather changes.  What increases the risk? The following factors may make you more likely to experience migraine headaches:  Age. Risk increases with age.  Family history of migraine headaches.  Being Caucasian.  Depression and anxiety.  Obesity.  Being a woman.  Having a hole in the heart (patent foramen ovale) or other heart problems.  What are the signs or symptoms? The main symptom of this condition is pulsating or throbbing pain. Pain may:  Happen in any area of the head, such as on one side or both sides.  Interfere with daily activities.  Get worse with physical activity.  Get worse with exposure to bright lights or loud noises.  Other symptoms may include:  Nausea.  Vomiting.  Dizziness.  General sensitivity to bright lights, loud noises, or smells.  Before you get a migraine, you may get warning signs that a migraine is developing (aura). An aura may include:  Seeing flashing lights or having blind spots.  Seeing bright spots, halos, or zigzag lines.  Having tunnel vision or blurred vision.  Having numbness or a tingling feeling.  Having trouble  talking.  Having muscle weakness.  How is this diagnosed? A migraine headache can be diagnosed based on:  Your symptoms.  A physical exam.  Tests, such as CT scan or MRI of the head. These imaging tests can help rule out other causes of headaches.  Taking fluid from the spine (lumbar puncture) and analyzing it (cerebrospinal fluid analysis, or CSF analysis).  How is this treated? A migraine headache is usually treated with medicines that:  Relieve pain.  Relieve nausea.  Prevent migraines from coming back.  Treatment may also include:  Acupuncture.  Lifestyle changes like avoiding foods that trigger migraines.  Follow these instructions at home: Medicines  Take over-the-counter and prescription medicines only as told by your health care provider.  Do not drive or use heavy machinery while taking prescription pain medicine.  To prevent or treat constipation while you are taking prescription pain medicine, your health care provider may recommend that you: ? Drink enough fluid to keep your urine clear or pale yellow. ? Take over-the-counter or prescription medicines. ? Eat foods that are high in fiber, such as fresh fruits and vegetables, whole grains, and beans. ? Limit foods that are high in fat and processed sugars, such as fried and sweet foods. Lifestyle  Avoid alcohol use.  Do not use any products that contain nicotine or tobacco, such as cigarettes and e-cigarettes. If you need help quitting, ask your health care provider.  Get at least 8 hours of sleep every night.  Limit your stress. General instructions   Keep a journal to find out what may trigger your migraine headaches. For example,  write down: ? What you eat and drink. ? How much sleep you get. ? Any change to your diet or medicines.  If you have a migraine: ? Avoid things that make your symptoms worse, such as bright lights. ? It may help to lie down in a dark, quiet room. ? Do not drive or  use heavy machinery. ? Ask your health care provider what activities are safe for you while you are experiencing symptoms.  Keep all follow-up visits as told by your health care provider. This is important. Contact a health care provider if:  You develop symptoms that are different or more severe than your usual migraine symptoms. Get help right away if:  Your migraine becomes severe.  You have a fever.  You have a stiff neck.  You have vision loss.  Your muscles feel weak or like you cannot control them.  You start to lose your balance often.  You develop trouble walking.  You faint. This information is not intended to replace advice given to you by your health care provider. Make sure you discuss any questions you have with your health care provider. Document Released: 06/23/2005 Document Revised: 01/11/2016 Document Reviewed: 12/10/2015 Elsevier Interactive Patient Education  2017 [employer].

## 2017-08-26 NOTE — Progress Notes (Signed)
Subjective:    Patient ID: [name], female    DOB: 1980/09/16, 37 y.o.   MRN: [id number]  HPI This is a 37 yo female, accompanied by her husband, who presents today with headache. Has history of migraines in past, was better after TAH. Awoke yesterday with band around head, throbbing, light and sound sensitivity, nausea and vomiting. Has tried Excedrin Migraine, benadryl, tramadol, acetaminophen with temporary improvement of symptoms. Has had 2 migraines as well as some other headaches in last month. Has been on HRT for several months.  In the past has been on amitriptyline and sumatriptan without relief. Has used fioricet with relief.   Past Medical History:  Diagnosis Date  . ADD (attention deficit disorder)    testing done 2016  . ALLERGIC RHINITIS 10/11/2009  . Allergy to latex 04/11/2010  . ANKLE PAIN, RIGHT 04/03/2009  . BACK PAIN 03/19/2009  . COMMON MIGRAINE 03/19/2009  . FLANK PAIN, RIGHT 05/11/2009  . GLUCOSE INTOLERANCE 03/19/2009  . HYDRONEPHROSIS, RIGHT 05/11/2009  . INSOMNIA-SLEEP DISORDER-UNSPEC 03/19/2009  . Left tibial fracture 2018  . NEPHROLITHIASIS, HX OF 03/19/2009  . RENAL CALCULUS, RIGHT 05/11/2009   Past Surgical History:  Procedure Laterality Date  . ABDOMINAL HYSTERECTOMY    . APPENDECTOMY  1994  . CYSTOSCOPY W/ URETERAL STENT PLACEMENT Left 09/24/2014   Procedure: CYSTOSCOPY WITH RETROGRADE PYELOGRAM/LEFT URETERAL STENT PLACEMENT;  Surgeon: Raynelle Bring, MD;  Location: [facility];  Service: Urology;  Laterality: Left;  . EYE SURGERY Bilateral    lens replacement 2014  . hx of etopic pregnancy  2005  . LITHOTRIPSY  2011  . OOPHORECTOMY  07/2008  . s/p left knee arthroscopic  1994   Family History  Problem Relation Age of Onset  . Alcohol abuse Mother        ETOH  . Bipolar disorder Mother        manic depressive/bipolar  . Thyroid disease Mother        Low thyroid  . Hypertension Father   . Hypertension Other   . Diabetes Other   . Heart disease  Other   . Stroke Other   . Cancer Other        Lung Cancer  . Colon cancer Maternal Grandmother   . Breast cancer Paternal Grandmother    Social History   Tobacco Use  . Smoking status: Former Smoker    Types: Cigarettes  . Smokeless tobacco: Never Used  . Tobacco comment: quit with chantix 2010  Substance Use Topics  . Alcohol use: Yes    Alcohol/week: 0.0 oz    Comment: rare  . Drug use: No      Review of Systems Per HPI     Objective:   Physical Exam  Constitutional: She is oriented to person, place, and time. She appears well-developed and well-nourished. No distress.  HENT:  Head: Normocephalic and atraumatic.  Nose: Nose normal.  Mouth/Throat: Oropharynx is clear and moist.  Eyes: Conjunctivae are normal.  Neck: Normal range of motion. Neck supple.  Cardiovascular: Normal rate and normal heart sounds.  Heart rate on auscultation 100.   Pulmonary/Chest: Effort normal and breath sounds normal.  Lymphadenopathy:    She has no cervical adenopathy.  Neurological: She is alert and oriented to person, place, and time. She has normal reflexes. No cranial nerve deficit. Coordination normal.  Skin: Skin is warm and dry. She is not diaphoretic.  Psychiatric: She has a normal mood and affect. Her behavior is normal. Judgment and  thought content normal.  Vitals reviewed.     BP 128/84   Pulse (!) 123   Temp 98.2 F (36.8 C) (Oral)   Wt 213 lb 8 oz (96.8 kg)   SpO2 98%   BMI 32.46 kg/m  Wt Readings from Last 3 Encounters:  08/26/17 213 lb 8 oz (96.8 kg)  04/22/17 210 lb (95.3 kg)  04/10/17 213 lb 8 oz (96.8 kg)       Assessment & Plan:  1. Migraine with aura and without status migrainosus, not intractable - Provided written and verbal information regarding diagnosis and treatment. - encouraged her to keep a headache journal, may need neuro referral.  - ketorolac (TORADOL) injection 60 mg - butalbital-acetaminophen-caffeine (FIORICET, ESGIC) 50-325-40 MG  tablet; Take 1-2 tablets by mouth every 6 (six) hours as needed for headache.  Dispense: 20 tablet; Refill: 0   Clarene Reamer, FNP-BC  [facility]  08/26/2017 1:59 PM

## 2017-08-30 ENCOUNTER — Other Ambulatory Visit: Payer: Self-pay | Admitting: Family Medicine

## 2017-08-31 NOTE — Telephone Encounter (Signed)
Electronic refill request Last refill 05/20/17 #12-/2 Last office visit 08/26/17/acute

## 2017-09-02 NOTE — Telephone Encounter (Signed)
Sent. Thanks.   

## 2017-09-03 ENCOUNTER — Other Ambulatory Visit: Payer: Self-pay | Admitting: Family Medicine

## 2017-09-03 DIAGNOSIS — G43109 Migraine with aura, not intractable, without status migrainosus: Secondary | ICD-10-CM

## 2017-09-04 NOTE — Telephone Encounter (Signed)
Last filled 08/26/17... Please advise

## 2017-09-06 NOTE — Telephone Encounter (Signed)
Please get update on patient first.  Thanks.

## 2017-09-07 MED ORDER — BUTALBITAL-APAP-CAFFEINE 50-325-40 MG PO TABS: 1.0000 | ORAL_TABLET | Freq: Four times a day (QID) | ORAL | 0 refills | Status: DC | PRN

## 2017-09-07 NOTE — Telephone Encounter (Signed)
Left message on voicemail for patient to call back. 

## 2017-09-07 NOTE — Telephone Encounter (Signed)
Sent but if needed frequently then needs OV to discuss options re: HA to limit use of this med. Thanks.

## 2017-09-07 NOTE — Telephone Encounter (Signed)
Pt states that the medication is helping with the headaches and w/o the medication when she takes anything OTC it does not help her. She has been without the medication since Thursday.

## 2017-09-07 NOTE — Telephone Encounter (Signed)
Patient notified as instructed by telephone and verbalized understanding. Patient stated that she has an upcoming appointment already scheduled in March and will discuss this when she comes in.

## 2017-09-09 ENCOUNTER — Encounter: Payer: Self-pay | Admitting: Family Medicine

## 2017-09-09 NOTE — Telephone Encounter (Signed)
Vaccine record printed and sent to be scanned in to Epic.  I have also added this record under historical immunizations.

## 2017-09-21 ENCOUNTER — Encounter: Payer: Self-pay | Admitting: Family Medicine

## 2017-09-28 ENCOUNTER — Other Ambulatory Visit: Payer: Self-pay | Admitting: Family Medicine

## 2017-09-28 ENCOUNTER — Other Ambulatory Visit: Payer: 59

## 2017-09-28 DIAGNOSIS — E049 Nontoxic goiter, unspecified: Secondary | ICD-10-CM

## 2017-09-28 DIAGNOSIS — F988 Other specified behavioral and emotional disorders with onset usually occurring in childhood and adolescence: Secondary | ICD-10-CM

## 2017-10-01 ENCOUNTER — Encounter: Payer: Self-pay | Admitting: Family Medicine

## 2017-10-01 ENCOUNTER — Ambulatory Visit (INDEPENDENT_AMBULATORY_CARE_PROVIDER_SITE_OTHER): Payer: 59 | Admitting: Family Medicine

## 2017-10-01 VITALS — BP 130/74 | HR 102 | Temp 99.3°F | Ht 68.0 in | Wt 211.0 lb

## 2017-10-01 DIAGNOSIS — G4700 Insomnia, unspecified: Secondary | ICD-10-CM

## 2017-10-01 DIAGNOSIS — Z0001 Encounter for general adult medical examination with abnormal findings: Secondary | ICD-10-CM

## 2017-10-01 DIAGNOSIS — E049 Nontoxic goiter, unspecified: Secondary | ICD-10-CM

## 2017-10-01 DIAGNOSIS — E8941 Symptomatic postprocedural ovarian failure: Secondary | ICD-10-CM | POA: Diagnosis not present

## 2017-10-01 DIAGNOSIS — G43109 Migraine with aura, not intractable, without status migrainosus: Secondary | ICD-10-CM | POA: Diagnosis not present

## 2017-10-01 DIAGNOSIS — M545 Low back pain, unspecified: Secondary | ICD-10-CM

## 2017-10-01 DIAGNOSIS — G8929 Other chronic pain: Secondary | ICD-10-CM

## 2017-10-01 DIAGNOSIS — F988 Other specified behavioral and emotional disorders with onset usually occurring in childhood and adolescence: Secondary | ICD-10-CM

## 2017-10-01 DIAGNOSIS — Z7189 Other specified counseling: Secondary | ICD-10-CM | POA: Diagnosis not present

## 2017-10-01 DIAGNOSIS — Z780 Asymptomatic menopausal state: Secondary | ICD-10-CM | POA: Diagnosis not present

## 2017-10-01 LAB — CBC WITH DIFFERENTIAL/PLATELET
BASOS PCT: 0.2 % (ref 0.0–3.0)
Basophils Absolute: 0 10*3/uL (ref 0.0–0.1)
EOS PCT: 2 % (ref 0.0–5.0)
Eosinophils Absolute: 0.2 10*3/uL (ref 0.0–0.7)
HCT: 45.1 % (ref 36.0–46.0)
Hemoglobin: 15.1 g/dL — ABNORMAL HIGH (ref 12.0–15.0)
LYMPHS ABS: 2.8 10*3/uL (ref 0.7–4.0)
Lymphocytes Relative: 25.1 % (ref 12.0–46.0)
MCHC: 33.5 g/dL (ref 30.0–36.0)
MCV: 84.7 fl (ref 78.0–100.0)
MONOS PCT: 10.2 % (ref 3.0–12.0)
Monocytes Absolute: 1.1 10*3/uL — ABNORMAL HIGH (ref 0.1–1.0)
NEUTROS ABS: 7 10*3/uL (ref 1.4–7.7)
NEUTROS PCT: 62.5 % (ref 43.0–77.0)
Platelets: 376 10*3/uL (ref 150.0–400.0)
RBC: 5.32 Mil/uL — AB (ref 3.87–5.11)
RDW: 13.5 % (ref 11.5–15.5)
WBC: 11.2 10*3/uL — ABNORMAL HIGH (ref 4.0–10.5)

## 2017-10-01 LAB — TSH: TSH: 4.07 u[IU]/mL (ref 0.35–4.50)

## 2017-10-01 MED ORDER — METHYLPHENIDATE HCL 5 MG PO TABS: 10.0000 mg | ORAL_TABLET | Freq: Two times a day (BID) | ORAL | 0 refills | Status: DC

## 2017-10-01 MED ORDER — TIZANIDINE HCL 4 MG PO TABS: 4.0000 mg | ORAL_TABLET | Freq: Four times a day (QID) | ORAL | 3 refills | Status: DC | PRN

## 2017-10-01 NOTE — Progress Notes (Signed)
CPE- See plan.  Routine anticipatory guidance given to patient.  See health maintenance.  The possibility exists that previously documented standard health maintenance information may have been brought forward from a previous encounter into this note.  If needed, that same information has been updated to reflect the current situation based on today's encounter.    Tetanus 2015 Flu- she was able to get egg free vaccine done at work prev.  PNA and shingles not due.  Mammogram not due.  Pap not indicated. S/p hysterectomy. TAH and BSO. No hot flashes usually.   DXA reasonable to consider.  She had hysterectomy and oophorectomy 10 years ago.  She had an atypical fracture with a tibial plateau fracture prev.   Living will d/w pt. Husband designated if patient were incapacitated.  Diet and exercise d/w pt.  Exercise prev affected by her leg injury.  Diet is healthy.   Pt d/w pt re: HIV screening. Prev done 2005.   Insomnia.  Using [place] with effect w/o ADE.  No parasomnias.  Her sleep is better than a year ago.    Attention fluctuates, better with med. "better than I used to be."  She has the most trouble with switching between tasks.  Discussed behavioral interventions, she tries to limit interruptions and that is better with her department change at work.  No adverse effect of medication, it helps. She had a GI illness, along with her spouse.  She is not vomiting now.  Still with some nausea but better in the meantime.  She has been off ritalin and othe rmeds recently with GI illness.  D/w pt.     She gives blood mult times per year.  CBC pending.    She has some migraines, with HRT noted.  With visual aura.  Nonsmoker.  Diet is healthy.  She limits extra caffeine.  She can have up to 4 days per week affected by headaches, some going for mult days.  Photophobia. Can wake with headache.  She has tried to limit fioricet.    She is on hormone replacement per gynecology status help with hot  flashes. Per patient she had a conversation with the prescribing M.D. about the risks and benefits of hormone replacement. D/w pt.    Her elbow pain is better.   She is putting up with residual tibial plateau fracture pain.  She has stopped using tramadol.    She has still used tizanidine for back pain, at baseline. No ADE on med.    PMH and SH reviewed  Meds, vitals, and allergies reviewed.   ROS: Per HPI.  Unless specifically indicated otherwise in HPI, the patient denies:  General: fever. Eyes: acute vision changes ENT: sore throat Cardiovascular: chest pain Respiratory: SOB GI: vomiting GU: dysuria Musculoskeletal: acute back pain Derm: acute rash Neuro: acute motor dysfunction Psych: worsening mood Endocrine: polydipsia Heme: bleeding Allergy: hayfever  GEN: nad, alert and oriented HEENT: mucous membranes moist NECK: supple w/o LA CV: rrr. PULM: ctab, no inc wob ABD: soft, +bs EXT: no edema SKIN: no acute rash

## 2017-10-01 NOTE — Patient Instructions (Signed)
Go to the lab on the way out.  We'll contact you with your lab report. Rosaria Ferries will call about your referral. Take care.  Glad to see you.  Let me check on getting the DXA ordered.

## 2017-10-02 ENCOUNTER — Telehealth: Payer: Self-pay

## 2017-10-02 DIAGNOSIS — Z7189 Other specified counseling: Secondary | ICD-10-CM | POA: Insufficient documentation

## 2017-10-02 NOTE — Telephone Encounter (Signed)
Left message for patient to call Donnelle Rubey back in regards to a referral-Starr Engel V Jonavon Trieu, RMA   

## 2017-10-02 NOTE — Assessment & Plan Note (Signed)
Living will d/w pt.  Husband designated if patient were incapacitated.  

## 2017-10-02 NOTE — Assessment & Plan Note (Signed)
Using [place] with effect w/o ADE.  No parasomnias.  Her sleep is better than a year ago.   Continue as is.  She agrees.

## 2017-10-02 NOTE — Assessment & Plan Note (Signed)
Her migraines have gotten worse in the last year.  She was previously able to manage these without significant morbidity.  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  Given the complexity of her situation and her worsening symptoms I would like neurology input.  She agrees.  Refer.  I appreciate the help of all involved.

## 2017-10-02 NOTE — Assessment & Plan Note (Addendum)
  Attention fluctuates, better with med. "better than I used to be."  She has the most trouble with switching between tasks.  Discussed behavioral interventions, she tries to limit interruptions and that is better with her department change at work.  No adverse effect of medication, it helps.  She had a GI illness, along with her spouse.  She is not vomiting now.  Still with some nausea but better in the meantime.  She has been off ritalin and othe rmeds recently with GI illness.  D/w pt.

## 2017-10-02 NOTE — Assessment & Plan Note (Signed)
She has still used tizanidine for back pain, at baseline. No ADE on med.

## 2017-10-02 NOTE — Assessment & Plan Note (Signed)
Tetanus 2015 Flu- she was able to get egg free vaccine done at work prev.  PNA and shingles not due.  Mammogram not due.  Pap not indicated. S/p hysterectomy. TAH and BSO. No hot flashes usually.   DXA reasonable to consider.  She had hysterectomy and oophorectomy 10 years ago.  She had an atypical fracture with a tibial plateau fracture prev.   Living will d/w pt. Husband designated if patient were incapacitated.  Diet and exercise d/w pt.  Exercise prev affected by her leg injury.  Diet is healthy.   Pt d/w pt re: HIV screening. Prev done 2005.

## 2017-10-02 NOTE — Assessment & Plan Note (Addendum)
She is on hormone replacement per gynecology status help with hot flashes. Per patient she had a conversation with the prescribing M.D. about the risks and benefits of hormone replacement. D/w pt.    She is putting up with residual tibial plateau fracture pain.  She has stopped using tramadol.    DXA ordered.

## 2017-10-03 LAB — PAIN MGMT, PROFILE 8 W/CONF, U
6 Acetylmorphine: NEGATIVE ng/mL (ref <10)
ALCOHOL METABOLITES: NEGATIVE ng/mL (ref <500)
Amphetamines: NEGATIVE ng/mL (ref <500)
BENZODIAZEPINES: NEGATIVE ng/mL (ref <100)
Buprenorphine, Urine: NEGATIVE ng/mL (ref <5)
COCAINE METABOLITE: NEGATIVE ng/mL (ref <150)
CREATININE: 187.1 mg/dL
MARIJUANA METABOLITE: POSITIVE ng/mL — AB (ref <20)
MDMA: NEGATIVE ng/mL (ref <500)
Marijuana Metabolite: 8 ng/mL — ABNORMAL HIGH (ref <5)
OXIDANT: NEGATIVE ug/mL (ref <200)
OXYCODONE: NEGATIVE ng/mL (ref <100)
Opiates: NEGATIVE ng/mL (ref <100)
PH: 6.67 (ref 4.5–9.0)

## 2017-10-04 ENCOUNTER — Other Ambulatory Visit: Payer: Self-pay | Admitting: Family Medicine

## 2017-10-04 DIAGNOSIS — F988 Other specified behavioral and emotional disorders with onset usually occurring in childhood and adolescence: Secondary | ICD-10-CM

## 2017-10-06 ENCOUNTER — Encounter: Payer: Self-pay | Admitting: Family Medicine

## 2017-10-07 ENCOUNTER — Encounter: Payer: Self-pay | Admitting: Family Medicine

## 2017-10-08 ENCOUNTER — Encounter: Payer: Self-pay | Admitting: *Deleted

## 2017-10-08 ENCOUNTER — Telehealth: Payer: Self-pay | Admitting: Family Medicine

## 2017-10-08 ENCOUNTER — Other Ambulatory Visit: Payer: Self-pay | Admitting: Family Medicine

## 2017-10-08 MED ORDER — ZOLPIDEM TARTRATE ER 12.5 MG PO TBCR: 12.5000 mg | EXTENDED_RELEASE_TABLET | Freq: Every day | ORAL | 0 refills | Status: DC

## 2017-10-08 NOTE — Telephone Encounter (Signed)
See mychart message.  Needs PA on ritalin.   Thanks.

## 2017-10-09 ENCOUNTER — Telehealth: Payer: Self-pay | Admitting: Family Medicine

## 2017-10-09 ENCOUNTER — Encounter: Payer: Self-pay | Admitting: *Deleted

## 2017-10-09 NOTE — Telephone Encounter (Signed)
PA approved.

## 2017-10-09 NOTE — Telephone Encounter (Signed)
Please call the pharmacy.  Okay to give the notice about [place] leaving the country re: early refill.  Thanks.

## 2017-10-09 NOTE — Telephone Encounter (Signed)
PA for Ritalin submitted thru CMM, awaiting response.

## 2017-10-09 NOTE — Telephone Encounter (Signed)
Left detailed message on voicemail of [facility] [address] and asked if there were any questions, please call us.

## 2017-11-13 ENCOUNTER — Other Ambulatory Visit: Payer: Self-pay | Admitting: Family Medicine

## 2017-11-16 NOTE — Telephone Encounter (Signed)
Electronic refill request Last refill 10/08/17 #30 Last office visit 10/01/17

## 2017-11-17 ENCOUNTER — Encounter: Payer: Self-pay | Admitting: Family Medicine

## 2017-11-17 NOTE — Telephone Encounter (Signed)
Sent. Thanks.   

## 2017-11-18 ENCOUNTER — Ambulatory Visit
Admission: RE | Admit: 2017-11-18 | Discharge: 2017-11-18 | Disposition: A | Discharge status: Home / Self Care | Payer: 59 | Source: Ambulatory Visit | Attending: Family Medicine | Admitting: Family Medicine

## 2017-11-18 DIAGNOSIS — Z780 Asymptomatic menopausal state: Secondary | ICD-10-CM | POA: Diagnosis not present

## 2017-11-18 DIAGNOSIS — Z13820 Encounter for screening for osteoporosis: Secondary | ICD-10-CM | POA: Diagnosis not present

## 2017-11-20 ENCOUNTER — Ambulatory Visit: Payer: Self-pay | Admitting: Internal Medicine

## 2017-11-20 DIAGNOSIS — Z0289 Encounter for other administrative examinations: Secondary | ICD-10-CM

## 2017-12-22 ENCOUNTER — Encounter: Payer: Self-pay | Admitting: Neurology

## 2017-12-22 ENCOUNTER — Ambulatory Visit: Payer: 59 | Admitting: Neurology

## 2017-12-22 VITALS — BP 147/95 | HR 90 | Ht 68.0 in | Wt 221.0 lb

## 2017-12-22 DIAGNOSIS — G43709 Chronic migraine without aura, not intractable, without status migrainosus: Secondary | ICD-10-CM

## 2017-12-22 DIAGNOSIS — IMO0002 Reserved for concepts with insufficient information to code with codable children: Secondary | ICD-10-CM | POA: Insufficient documentation

## 2017-12-22 MED ORDER — NORTRIPTYLINE HCL 10 MG PO CAPS: 20.0000 mg | ORAL_CAPSULE | Freq: Every day | ORAL | 11 refills | Status: DC

## 2017-12-22 MED ORDER — SUMATRIPTAN SUCCINATE 50 MG PO TABS: 50.0000 mg | ORAL_TABLET | ORAL | 6 refills | Status: DC | PRN

## 2017-12-22 NOTE — Progress Notes (Signed)
PATIENT: [name] DOB: [birth year]  Chief Complaint  Patient presents with  . Migraine    Reports having migraines in her teenage years that became problematic again six months ago.  Her PCP had provided her with Fioricet that was helpful (no longer has refills).  She is allergic to NSAIDS (rash, sweliing, itching).  When she was younger, she tried amitriptyline, sumatriptan and rizatriptan.    Marland Kitchen PCP    Tonia Ghent, MD     HISTORICAL  [name] is a 37 year old female, seen in refer by her primary care physician Dr. Tonia Ghent for evaluation of migraine headache initial evaluation was on December 22, 2017.  She reported history of migraine headaches since middle school, her typical migraine are lateralized severe pounding headache with associated light, noise sensitivity, nauseous, previously tried Imitrex, and Maxalt, which has been helpful, for a while, she was giving preventive medication amitriptyline, she seems to benefit from that,  She has not bothered by her migraine headache for a while until January 2019, she began to have increased a headache, since April 2019, she had almost daily headaches, she has been taking Tylenol once or even more times every day to control her headache, daily headaches are bilateral frontal, vortex, occipital area burning sensation, pressure, sometimes with exacerbated to migraine-like headaches, with associated ringing in her ears, blurry vision, light sensitivity, Fioricet as needed was helpful  REVIEW OF SYSTEMS: Full 14 system review of systems performed and notable only for as above  ALLERGIES: Allergies  Allergen Reactions  . Amoxicillin-Pot Clavulanate Swelling    Facial swelling Has patient had a PCN reaction causing immediate rash, facial/tongue/throat swelling, SOB or lightheadedness with hypotension: Yes Has patient had a PCN reaction causing severe rash involving mucus membranes or skin necrosis:Unknown Has patient  had a PCN reaction that required hospitalization: No Has patient had a PCN reaction occurring within the last 10 years: No If all of the above answers are "NO", then may proceed with Cephalosporin use.   . Latex     Carries epipen  . Aspirin Other (See Comments)    Swelling, itching, rash  . Eggs Or Egg-Derived Products Other (See Comments)    Prev positive allergy testing   . Ibuprofen     Patient reports swelling, rash and itching.   . Nsaids     Patient reports swelling, rash and itching.  . Other Other (See Comments)    Skin lotion- skin allergy  . Penicillins     Has patient had a PCN reaction causing immediate rash, facial/tongue/throat swelling, SOB or lightheadedness with hypotension: Yes as patient had a PCN reaction causing severe rash involving mucus membranes or skin necrosis: Unknown Has patient had a PCN reaction that required hospitalization: Yes Has patient had a PCN reaction occurring within the last 10 years:No If all of the above answers are "NO", then may proceed with Cephalosporin use.   . Red Dye     rash    HOME MEDICATIONS: Current Outpatient Medications  Medication Sig Dispense Refill  . butalbital-acetaminophen-caffeine (FIORICET, ESGIC) 50-325-40 MG tablet Take 1-2 tablets by mouth every 6 (six) hours as needed for headache. 20 tablet 0  . DIVIGEL 1 MG/GM GEL Apply topically daily. as directed  6  . EPINEPHrine 0.3 mg/0.3 mL IJ SOAJ injection Inject 0.3 mLs (0.3 mg total) into the muscle once. Okay to fill wit comparable epinephrine auto injector 2 Device 1  . fluticasone (FLONASE) 50 MCG/ACT nasal  spray Place 2 sprays into both nostrils daily. 16 g 6  . methylphenidate (RITALIN) 5 MG tablet Take 2-3 tablets (10-15 mg total) by mouth 2 (two) times daily. 180 tablet 0  . tiZANidine (ZANAFLEX) 4 MG tablet Take 1 tablet (4 mg total) by mouth every 6 (six) hours as needed. 60 tablet 3  . zolpidem (AMBIEN CR) 12.5 MG CR tablet TAKE 1 TABLET (12.5 MG TOTAL)  BY MOUTH AT BEDTIME. 30 tablet 1   No current facility-administered medications for this visit.     PAST MEDICAL HISTORY: Past Medical History:  Diagnosis Date  . ADD (attention deficit disorder)    testing done 2016  . ALLERGIC RHINITIS 10/11/2009  . Allergy to latex 04/11/2010  . ANKLE PAIN, RIGHT 04/03/2009  . BACK PAIN 03/19/2009  . COMMON MIGRAINE 03/19/2009  . FLANK PAIN, RIGHT 05/11/2009  . GLUCOSE INTOLERANCE 03/19/2009  . HYDRONEPHROSIS, RIGHT 05/11/2009  . INSOMNIA-SLEEP DISORDER-UNSPEC 03/19/2009  . Left tibial fracture 2018  . Migraine   . NEPHROLITHIASIS, HX OF 03/19/2009  . RENAL CALCULUS, RIGHT 05/11/2009    PAST SURGICAL HISTORY: Past Surgical History:  Procedure Laterality Date  . ABDOMINAL HYSTERECTOMY    . APPENDECTOMY  1994  . CYSTOSCOPY W/ URETERAL STENT PLACEMENT Left 09/24/2014   Procedure: CYSTOSCOPY WITH RETROGRADE PYELOGRAM/LEFT URETERAL STENT PLACEMENT;  Surgeon: Raynelle Bring, MD;  Location: [facility];  Service: Urology;  Laterality: Left;  . EYE SURGERY Bilateral    lens replacement 2014  . hx of etopic pregnancy  2005  . LITHOTRIPSY  2011  . OOPHORECTOMY  07/2008  . s/p left knee arthroscopic  1994    FAMILY HISTORY: Family History  Problem Relation Age of Onset  . Alcohol abuse Mother        ETOH  . Bipolar disorder Mother        manic depressive/bipolar  . Thyroid disease Mother        Low thyroid  . Hypertension Father   . Hypertension Other   . Diabetes Other   . Heart disease Other   . Stroke Other   . Cancer Other        Lung Cancer  . Colon cancer Maternal Grandmother   . Breast cancer Paternal Grandmother     SOCIAL HISTORY:  Social History   Socioeconomic History  . Marital status: Married    Spouse name: Not on file  . Number of children: 0  . Years of education: some college  . Highest education level: Not on file  Occupational History  . Occupation: Teacher, early years/pre  Social Needs  . Financial resource strain: Not on file   . Food insecurity:    Worry: Not on file    Inability: Not on file  . Transportation needs:    Medical: Not on file    Non-medical: Not on file  Tobacco Use  . Smoking status: Former Smoker    Types: Cigarettes    Last attempt to quit: 2017    Years since quitting: 2.4  . Smokeless tobacco: Never Used  Substance and Sexual Activity  . Alcohol use: Yes    Alcohol/week: 0.0 oz    Comment: rare  . Drug use: No  . Sexual activity: Yes  Lifestyle  . Physical activity:    Days per week: Not on file    Minutes per session: Not on file  . Stress: Not on file  Relationships  . Social connections:    Talks on phone: Not on file  Gets together: Not on file    Attends religious service: Not on file    Active member of club or organization: Not on file    Attends meetings of clubs or organizations: Not on file    Relationship status: Not on file  . Intimate partner violence:    Fear of current or ex partner: Not on file    Emotionally abused: Not on file    Physically abused: Not on file    Forced sexual activity: Not on file  Other Topics Concern  . Not on file  Social History Narrative   Married to [employer]   Works at [employer], Teacher, early years/pre   Enjoys reading   No kids.   Right-handed.   1-2 cups caffeine per day     PHYSICAL EXAM   Vitals:   12/22/17 0813  BP: (!) 147/95  Pulse: 90  Weight: 221 lb (100.2 kg)  Height: 5\' 8"  (1.727 m)    Not recorded      Body mass index is 33.6 kg/m.  PHYSICAL EXAMNIATION:  Gen: NAD, conversant, well nourised, obese, well groomed                     Cardiovascular: Regular rate rhythm, no peripheral edema, warm, nontender. Eyes: Conjunctivae clear without exudates or hemorrhage Neck: Supple, no carotid bruits. Pulmonary: Clear to auscultation bilaterally   NEUROLOGICAL EXAM:  MENTAL STATUS: Speech:    Speech is normal; fluent and spontaneous with normal comprehension.  Cognition:     Orientation to time,  place and person     Normal recent and remote memory     Normal Attention span and concentration     Normal Language, naming, repeating,spontaneous speech     Fund of knowledge   CRANIAL NERVES: CN II: Visual fields are full to confrontation. Fundoscopic exam is normal with sharp discs and no vascular changes. Pupils are round equal and briskly reactive to light. CN III, IV, VI: extraocular movement are normal. No ptosis. CN V: Facial sensation is intact to pinprick in all 3 divisions bilaterally. Corneal responses are intact.  CN VII: Face is symmetric with normal eye closure and smile. CN VIII: Hearing is normal to rubbing fingers CN IX, X: Palate elevates symmetrically. Phonation is normal. CN XI: Head turning and shoulder shrug are intact CN XII: Tongue is midline with normal movements and no atrophy.  MOTOR: There is no pronator drift of out-stretched arms. Muscle bulk and tone are normal. Muscle strength is normal.  REFLEXES: Reflexes are 2+ and symmetric at the biceps, triceps, knees, and ankles. Plantar responses are flexor.  SENSORY: Intact to light touch, pinprick, positional sensation and vibratory sensation are intact in fingers and toes.  COORDINATION: Rapid alternating movements and fine finger movements are intact. There is no dysmetria on finger-to-nose and heel-knee-shin.    GAIT/STANCE: Posture is normal. Gait is steady with normal steps, base, arm swing, and turning. Heel and toe walking are normal. Tandem gait is normal.  Romberg is absent.   DIAGNOSTIC DATA (LABS, IMAGING, TESTING) - I reviewed patient records, labs, notes, testing and imaging myself where available.   ASSESSMENT AND PLAN  [name] is a 37 y.o. female   Chronic migraine headaches  New onset daily headaches with some migraine features, could also have a component of medicine rebound headaches  I have suggested her stop daily Tylenol use  Nortriptyline 10 mg titrating to 20 mg  every night as preventive medications,  May consider magnesium oxide 400 mg twice a day, riboflavin 100 mg twice a day as migraine prevention  Imitrex 50 mg as needed   Marcial Pacas, M.D. Ph.D.  [facility] [address] [place], [place] [address] Ph: [phone] Fax: [phone]  CC: Tonia Ghent, MD

## 2017-12-22 NOTE — Patient Instructions (Signed)
Magnesium oxide 400 mg twice a day Riboflavin  100 mg twice a day 

## 2018-01-04 ENCOUNTER — Ambulatory Visit: Payer: Self-pay | Admitting: *Deleted

## 2018-01-04 NOTE — Telephone Encounter (Signed)
I spoke with pt and since 01/01/18 pt has had a fast heart rate ranging from laying HR 75-80, sitting HR 90-95 and standing the highest it has gone is 135.pt does not have CP or SOB. Pt does have H/A and dizziness from the sitting to standing position. Pt does not have a way to ck her BP. Pt has not been taking any decongestants. Pt is going to UC now. FYI to Dr Damita Dunnings.

## 2018-01-04 NOTE — Telephone Encounter (Signed)
  Patient is calling to report that she has been having increased heart rate- 110 since Friday- it gets better- slows down when she lays down- 75. She states she gets a headache as well.  Attempted to schedule appointment with PCP or [facility] office- but patient stated the process took too long and she could go to UC or call back for appointment later. Patient disconnected call. Reason for Disposition . [1] Palpitations AND [2] no improvement after using CARE ADVICE  Answer Assessment - Initial Assessment Questions 1. DESCRIPTION: "Please describe your heart rate or heart beat that you are having" (e.g., fast/slow, regular/irregular, skipped or extra beats, "palpitations")     Fast/slow 2. ONSET: "When did it start?" (Minutes, hours or days)      Since Friday- Saturday was bad 3. DURATION: "How long does it last" (e.g., seconds, minutes, hours)     Increase stays up when she is up- it might decrease by 10 beats 4. PATTERN "Does it come and go, or has it been constant since it started?"  "Does it get worse with exertion?"   "Are you feeling it now?"     Constant since started- it does slow when patient lays down- it increases with exertion, patient can feel it in her chest- and she can monitor it with her watch 5. TAP: "Using your hand, can you tap out what you are feeling on a chair or table in front of you, so that I can hear?" (Note: not all patients can do this)       Patient feels her heart rate is reguler 6. HEART RATE: "Can you tell me your heart rate?" "How many beats in 15 seconds?"  (Note: not all patients can do this)       109 7. RECURRENT SYMPTOM: "Have you ever had this before?" If so, ask: "When was the last time?" and "What happened that time?"      Patient has had issues with decongestants  8. CAUSE: "What do you think is causing the palpitations?"     Headaches, change in medication 9. CARDIAC HISTORY: "Do you have any history of heart disease?" (e.g., heart attack, angina, bypass  surgery, angioplasty, arrhythmia)      Family history 10. OTHER SYMPTOMS: "Do you have any other symptoms?" (e.g., dizziness, chest pain, sweating, difficulty breathing)       Some dizziness changing positions, headcahe 11. PREGNANCY: "Is there any chance you are pregnant?" "When was your last menstrual period?"       No- hysterectomy  Protocols used: HEART RATE AND HEARTBEAT QUESTIONS-A-AH

## 2018-01-04 NOTE — Telephone Encounter (Signed)
Noted.  I'll await the notes from UC.  Thanks.

## 2018-01-05 ENCOUNTER — Emergency Department (HOSPITAL_BASED_OUTPATIENT_CLINIC_OR_DEPARTMENT_OTHER)
Admission: EM | Admit: 2018-01-05 | Discharge: 2018-01-05 | Disposition: A | Discharge status: Home / Self Care | Payer: 59 | Attending: Emergency Medicine | Admitting: Emergency Medicine

## 2018-01-05 ENCOUNTER — Other Ambulatory Visit: Payer: Self-pay

## 2018-01-05 ENCOUNTER — Encounter (HOSPITAL_BASED_OUTPATIENT_CLINIC_OR_DEPARTMENT_OTHER): Payer: Self-pay | Admitting: Emergency Medicine

## 2018-01-05 DIAGNOSIS — Z87891 Personal history of nicotine dependence: Secondary | ICD-10-CM | POA: Diagnosis not present

## 2018-01-05 DIAGNOSIS — Z79899 Other long term (current) drug therapy: Secondary | ICD-10-CM | POA: Diagnosis not present

## 2018-01-05 DIAGNOSIS — Z91040 Latex allergy status: Secondary | ICD-10-CM | POA: Diagnosis not present

## 2018-01-05 DIAGNOSIS — G43909 Migraine, unspecified, not intractable, without status migrainosus: Secondary | ICD-10-CM | POA: Diagnosis not present

## 2018-01-05 DIAGNOSIS — R002 Palpitations: Secondary | ICD-10-CM | POA: Diagnosis not present

## 2018-01-05 DIAGNOSIS — R519 Headache, unspecified: Secondary | ICD-10-CM

## 2018-01-05 DIAGNOSIS — R51 Headache: Secondary | ICD-10-CM | POA: Diagnosis not present

## 2018-01-05 LAB — COMPREHENSIVE METABOLIC PANEL
ALBUMIN: 4.2 g/dL (ref 3.5–5.0)
ALT: 36 U/L (ref 0–44)
ANION GAP: 7 (ref 5–15)
AST: 31 U/L (ref 15–41)
Alkaline Phosphatase: 95 U/L (ref 38–126)
BUN: 17 mg/dL (ref 6–20)
CHLORIDE: 105 mmol/L (ref 98–111)
CO2: 28 mmol/L (ref 22–32)
Calcium: 9.3 mg/dL (ref 8.9–10.3)
Creatinine, Ser: 0.79 mg/dL (ref 0.44–1.00)
GFR calc non Af Amer: >60 mL/min (ref >60)
Glucose, Bld: 94 mg/dL (ref 70–99)
POTASSIUM: 3.9 mmol/L (ref 3.5–5.1)
SODIUM: 140 mmol/L (ref 135–145)
Total Bilirubin: 0.4 mg/dL (ref 0.3–1.2)
Total Protein: 7.9 g/dL (ref 6.5–8.1)

## 2018-01-05 LAB — CBC WITH DIFFERENTIAL/PLATELET
Basophils Absolute: 0 10*3/uL (ref 0.0–0.1)
Basophils Relative: 0 %
Eosinophils Absolute: 0.3 10*3/uL (ref 0.0–0.7)
Eosinophils Relative: 3 %
HEMATOCRIT: 44.1 % (ref 36.0–46.0)
HEMOGLOBIN: 15 g/dL (ref 12.0–15.0)
LYMPHS ABS: 3.4 10*3/uL (ref 0.7–4.0)
LYMPHS PCT: 32 %
MCH: 28 pg (ref 26.0–34.0)
MCHC: 34 g/dL (ref 30.0–36.0)
MCV: 82.4 fL (ref 78.0–100.0)
MONOS PCT: 9 %
Monocytes Absolute: 1 10*3/uL (ref 0.1–1.0)
NEUTROS ABS: 6 10*3/uL (ref 1.7–7.7)
NEUTROS PCT: 56 %
Platelets: 408 10*3/uL — ABNORMAL HIGH (ref 150–400)
RBC: 5.35 MIL/uL — ABNORMAL HIGH (ref 3.87–5.11)
RDW: 13.5 % (ref 11.5–15.5)
WBC: 10.6 10*3/uL — ABNORMAL HIGH (ref 4.0–10.5)

## 2018-01-05 LAB — D-DIMER, QUANTITATIVE: D-Dimer, Quant: <0.27 ug/mL-FEU (ref 0.00–0.50)

## 2018-01-05 LAB — TROPONIN I

## 2018-01-05 MED ORDER — SODIUM CHLORIDE 0.9 % IV BOLUS
1000.0000 mL | Freq: Once | INTRAVENOUS | Status: AC
  Administered 2018-01-05: 1000 mL via INTRAVENOUS

## 2018-01-05 MED ORDER — METOCLOPRAMIDE HCL 5 MG/ML IJ SOLN
10.0000 mg | Freq: Once | INTRAMUSCULAR | Status: AC
  Administered 2018-01-05: 10 mg via INTRAVENOUS
  Filled 2018-01-05: qty 2

## 2018-01-05 MED ORDER — DEXAMETHASONE 4 MG PO TABS
10.0000 mg | ORAL_TABLET | Freq: Once | ORAL | Status: AC
  Administered 2018-01-05: 10 mg via ORAL
  Filled 2018-01-05: qty 1

## 2018-01-05 NOTE — ED Provider Notes (Signed)
[facility] EMERGENCY DEPARTMENT Provider Note   CSN: [id number] Arrival date & time: 01/05/18  1005     History   Chief Complaint Chief Complaint  Patient presents with  . Headache  . Palpitations    HPI [name] is a 37 y.o. female.  The history is provided by the patient. No language interpreter was used.  Headache   Associated symptoms include palpitations.  Palpitations   Associated symptoms include headaches.   [name] is a 37 y.o. female who presents to the Emergency Department complaining of headache, palpitations. He has a history of recurrent headache and is followed by neurology. About a week ago she developed a occipital headache that is constant and pounding in nature. It was gradual and onset and is overall improving. Around the same time she also developed palpitations with a fluttering and pounding sensation in her chest. Palpitations are worse when she goes from sitting to standing or with activity. She does have a mild shortness of breath with the palpitations are present. She denies any fever, chest pain, cough, nausea, vomiting, leg swelling or pain. She does have a history of hysterectomy. She does take hormone replacement therapy and is a smoker. Symptoms are moderate in nature. Past Medical History:  Diagnosis Date  . ADD (attention deficit disorder)    testing done 2016  . ALLERGIC RHINITIS 10/11/2009  . Allergy to latex 04/11/2010  . ANKLE PAIN, RIGHT 04/03/2009  . BACK PAIN 03/19/2009  . COMMON MIGRAINE 03/19/2009  . FLANK PAIN, RIGHT 05/11/2009  . GLUCOSE INTOLERANCE 03/19/2009  . HYDRONEPHROSIS, RIGHT 05/11/2009  . INSOMNIA-SLEEP DISORDER-UNSPEC 03/19/2009  . Left tibial fracture 2018  . Migraine   . NEPHROLITHIASIS, HX OF 03/19/2009  . RENAL CALCULUS, RIGHT 05/11/2009    Patient Active Problem List   Diagnosis Date Noted  . Chronic migraine 12/22/2017  . Advance care planning 10/02/2017  . Symptomatic postsurgical menopause  11/20/2016  . Neck mass 09/30/2016  . BRBPR (bright red blood per rectum) 09/30/2016  . Encounter for general adult medical examination with abnormal findings 08/30/2015  . Pain in joint, shoulder region 08/30/2015  . Rash and nonspecific skin eruption 11/29/2014  . Ureteral stone 09/24/2014  . ADD (attention deficit disorder) 06/20/2014  . Obesity (BMI 30-39.9) 08/15/2013  . Stress incontinence 08/15/2013  . Anxiety state, unspecified 08/15/2013  . Chronic back pain 03/23/2013  . Nephrolithiasis 12/31/2012  . Unspecified constipation 12/29/2012  . LATEX ALLERGY 04/11/2010  . ALLERGIC RHINITIS 10/11/2009  . Migraine with aura 03/19/2009  . Insomnia 03/19/2009    Past Surgical History:  Procedure Laterality Date  . ABDOMINAL HYSTERECTOMY    . APPENDECTOMY  1994  . CYSTOSCOPY W/ URETERAL STENT PLACEMENT Left 09/24/2014   Procedure: CYSTOSCOPY WITH RETROGRADE PYELOGRAM/LEFT URETERAL STENT PLACEMENT;  Surgeon: Raynelle Bring, MD;  Location: [facility];  Service: Urology;  Laterality: Left;  . EYE SURGERY Bilateral    lens replacement 2014  . hx of etopic pregnancy  2005  . LITHOTRIPSY  2011  . OOPHORECTOMY  07/2008  . s/p left knee arthroscopic  1994     OB History   None      Home Medications    Prior to Admission medications   Medication Sig Start Date End Date Taking? Authorizing Provider  methylphenidate (RITALIN) 5 MG tablet Take 2-3 tablets (10-15 mg total) by mouth 2 (two) times daily. 10/01/17  Yes Tonia Ghent, MD  tiZANidine (ZANAFLEX) 4 MG tablet Take 1 tablet (4  mg total) by mouth every 6 (six) hours as needed. 10/01/17  Yes Tonia Ghent, MD  zolpidem (AMBIEN CR) 12.5 MG CR tablet TAKE 1 TABLET (12.5 MG TOTAL) BY MOUTH AT BEDTIME. 11/17/17  Yes Tonia Ghent, MD  DIVIGEL 1 MG/GM GEL Apply topically daily. as directed 03/13/17   [provider]  EPINEPHrine 0.3 mg/0.3 mL IJ SOAJ injection Inject 0.3 mLs (0.3 mg total) into the muscle once. Okay to fill  wit comparable epinephrine auto injector 08/29/15   Tonia Ghent, MD  fluticasone [facility]) 50 MCG/ACT nasal spray Place 2 sprays into both nostrils daily. 04/10/17   Elby Beck, FNP  nortriptyline (PAMELOR) 10 MG capsule Take 2 capsules (20 mg total) by mouth at bedtime. 12/22/17   Marcial Pacas, MD  SUMAtriptan (IMITREX) 50 MG tablet Take 1 tablet (50 mg total) by mouth every 2 (two) hours as needed for migraine. May repeat in 2 hours if headache persists or recurs. 12/22/17   Marcial Pacas, MD    Family History Family History  Problem Relation Age of Onset  . Alcohol abuse Mother        ETOH  . Bipolar disorder Mother        manic depressive/bipolar  . Thyroid disease Mother        Low thyroid  . Hypertension Father   . Hypertension Other   . Diabetes Other   . Heart disease Other   . Stroke Other   . Cancer Other        Lung Cancer  . Colon cancer Maternal Grandmother   . Breast cancer Paternal Grandmother     Social History Social History   Tobacco Use  . Smoking status: Former Smoker    Types: Cigarettes    Last attempt to quit: 2017    Years since quitting: 2.4  . Smokeless tobacco: Never Used  Substance Use Topics  . Alcohol use: Yes    Alcohol/week: 0.0 oz    Comment: rare  . Drug use: No     Allergies   Amoxicillin-pot clavulanate; Latex; Aspirin; Eggs or egg-derived products; Ibuprofen; Nsaids; Other; Penicillins; and Red dye   Review of Systems Review of Systems  Cardiovascular: Positive for palpitations.  Neurological: Positive for headaches.  All other systems reviewed and are negative.    Physical Exam Updated Vital Signs BP (!) 119/58   Pulse 86   Temp 97.7 F (36.5 C)   Resp 11   Ht 5\' 8"  (1.727 m)   Wt 100.2 kg (221 lb)   SpO2 94%   BMI 33.60 kg/m   Physical Exam  Constitutional: She is oriented to person, place, and time. She appears well-developed and well-nourished.  HENT:  Head: Normocephalic and atraumatic.    Cardiovascular: Normal rate and regular rhythm.  No murmur heard. Pulmonary/Chest: Effort normal and breath sounds normal. No respiratory distress.  Abdominal: Soft. There is no tenderness. There is no rebound and no guarding.  Musculoskeletal: She exhibits no edema or tenderness.  Neurological: She is alert and oriented to person, place, and time. No cranial nerve deficit.  5/5 strength in all four extremities with sensation to light touch intact in all four extremities.  No pronator drift.    Skin: Skin is warm and dry.  Psychiatric: She has a normal mood and affect. Her behavior is normal.  Nursing note and vitals reviewed.    ED Treatments / Results  Labs (all labs ordered are listed, but only abnormal results are  displayed) Labs Reviewed  CBC WITH DIFFERENTIAL/PLATELET - Abnormal; Notable for the following components:      Result Value   WBC 10.6 (*)    RBC 5.35 (*)    Platelets 408 (*)    All other components within normal limits  COMPREHENSIVE METABOLIC PANEL  D-DIMER, QUANTITATIVE (NOT AT [facility])  TROPONIN I    EKG EKG Interpretation  Date/Time:  Tuesday January 05 2018 10:20:36 EDT Ventricular Rate:  85 PR Interval:    QRS Duration: 93 QT Interval:  349 QTC Calculation: 415 R Axis:   87 Text Interpretation:  Sinus rhythm Borderline T abnormalities, diffuse leads No significant change since last tracing Confirmed by Quintella Reichert [id number]) on 01/05/2018 10:26:03 AM   Radiology No results found.  Procedures Procedures (including critical care time)  Medications Ordered in ED Medications  dexamethasone (DECADRON) tablet 10 mg (has no administration in time range)  sodium chloride 0.9 % bolus 1,000 mL (1,000 mLs Intravenous New Bag/Given 01/05/18 1124)  metoCLOPramide (REGLAN) injection 10 mg (10 mg Intravenous Given 01/05/18 1121)     Initial Impression / Assessment and Plan / ED Course  I have reviewed the triage vital signs and the nursing notes.  Pertinent  labs & imaging results that were available during my care of the patient were reviewed by me and considered in my medical decision making (see chart for details).     He has a history of recurrent headache and reports headache for the last week as well as intermittent palpitations. She does have mild or the stasis on standing. Labs with no evidence of anemia or acute electrolyte abnormality. She has a non-focal neurologic exam. Current presentation is not consistent with subarachnoid hemorrhage, intracranial mass, dural sinus thrombosis, PE, ACS. Her headache is resolved on repeat assessment in the emergency department. Discussed with patient home care for headache as well as palpitations. Recommend neurology follow-up regarding headache as well as cardiology follow-up regarding recurrent palpitations. Return precautions discussed.  Final Clinical Impressions(s) / ED Diagnoses   Final diagnoses:  Palpitations  Bad headache    ED Discharge Orders    None       Quintella Reichert, MD 01/05/18 1231

## 2018-01-05 NOTE — ED Triage Notes (Signed)
Pt having headache for several months.  Pt states Saturday she started having heart palpitations.

## 2018-01-06 ENCOUNTER — Telehealth: Payer: Self-pay | Admitting: Family Medicine

## 2018-01-06 NOTE — Telephone Encounter (Signed)
Left message on voicemail for patient to call back.  Okay for PEC/triage nurse to relay message to patient, CRM done.

## 2018-01-06 NOTE — Telephone Encounter (Signed)
Please check on patient after ER eval. ER doc recommend neurology follow-up regarding headache as well as cardiology follow-up regarding recurrent palpitations.  Let me know if she needs help getting set up.  Thanks.

## 2018-01-08 NOTE — Telephone Encounter (Signed)
Left another detailed message asking for a call back with information.  CRM was created previously.

## 2018-01-11 ENCOUNTER — Encounter: Payer: Self-pay | Admitting: *Deleted

## 2018-01-11 NOTE — Telephone Encounter (Signed)
Left detailed message on voicemail. Letter mailed.  °

## 2018-01-12 ENCOUNTER — Encounter: Payer: Self-pay | Admitting: Family Medicine

## 2018-01-13 ENCOUNTER — Other Ambulatory Visit: Payer: Self-pay | Admitting: Neurology

## 2018-01-17 ENCOUNTER — Other Ambulatory Visit: Payer: Self-pay | Admitting: Family Medicine

## 2018-01-18 ENCOUNTER — Other Ambulatory Visit: Payer: Self-pay | Admitting: Family Medicine

## 2018-01-18 NOTE — Telephone Encounter (Addendum)
Name of Medication: Methylphenidate Name of Pharmacy: [facility] [place] or Written Date and Quantity:  180 tablet 0 10/01/2017  Last Office Visit and Type: 10/01/17 CPE Next Office Visit and Type: None Last Controlled Substance Agreement Date: 04/25/15 Last UDS: 04/25/15    .

## 2018-01-18 NOTE — Telephone Encounter (Signed)
Electronic refill request. Zolpidem Last office visit:   10/01/17 CPE Last Filled:     30 tablet 1 11/17/2017  Please advise.

## 2018-01-19 MED ORDER — METHYLPHENIDATE HCL 5 MG PO TABS: 10.0000 mg | ORAL_TABLET | Freq: Two times a day (BID) | ORAL | 0 refills | Status: DC

## 2018-01-19 NOTE — Telephone Encounter (Signed)
Sent. Thanks.   

## 2018-02-06 ENCOUNTER — Other Ambulatory Visit: Payer: Self-pay | Admitting: Family Medicine

## 2018-02-08 NOTE — Telephone Encounter (Signed)
Electronic refill request LOV 10/01/17 Last refill 10/01/17 #60/3

## 2018-02-17 DIAGNOSIS — J029 Acute pharyngitis, unspecified: Secondary | ICD-10-CM | POA: Diagnosis not present

## 2018-03-12 ENCOUNTER — Other Ambulatory Visit: Payer: Self-pay | Admitting: *Deleted

## 2018-03-12 ENCOUNTER — Encounter: Payer: Self-pay | Admitting: *Deleted

## 2018-03-12 ENCOUNTER — Encounter: Payer: Self-pay | Admitting: Family Medicine

## 2018-03-12 NOTE — Telephone Encounter (Signed)
Patient requested refill through MyChart Zolpidem Last office visit:   10/01/17 Last Filled:    30 tablet 1 01/19/2018  Please advise.

## 2018-03-14 MED ORDER — ZOLPIDEM TARTRATE ER 12.5 MG PO TBCR: 12.5000 mg | EXTENDED_RELEASE_TABLET | Freq: Every day | ORAL | 1 refills | Status: DC

## 2018-03-14 NOTE — Telephone Encounter (Signed)
Sent. Thanks.   

## 2018-03-21 ENCOUNTER — Other Ambulatory Visit: Payer: Self-pay | Admitting: Family Medicine

## 2018-03-31 ENCOUNTER — Telehealth: Payer: Self-pay | Admitting: *Deleted

## 2018-03-31 ENCOUNTER — Ambulatory Visit: Payer: 59 | Admitting: Neurology

## 2018-03-31 NOTE — Telephone Encounter (Signed)
No showed follow up appointment. 

## 2018-04-01 ENCOUNTER — Other Ambulatory Visit: Payer: Self-pay | Admitting: Family Medicine

## 2018-04-01 ENCOUNTER — Encounter: Payer: Self-pay | Admitting: Neurology

## 2018-04-01 MED ORDER — METHYLPHENIDATE HCL 5 MG PO TABS: 10.0000 mg | ORAL_TABLET | Freq: Two times a day (BID) | ORAL | 0 refills | Status: DC

## 2018-04-01 NOTE — Telephone Encounter (Signed)
Electronic refill request. Methylphenidate Last office visit:   10/01/17 CPE Last Filled:     180 tablet 0 01/19/2018  Please advise.

## 2018-04-01 NOTE — Telephone Encounter (Signed)
Sent. Thanks.   

## 2018-04-11 ENCOUNTER — Other Ambulatory Visit: Payer: Self-pay | Admitting: Family Medicine

## 2018-04-12 NOTE — Telephone Encounter (Signed)
Tizanidine Last filled:  03/18/18, #60 Last OV:  10/01/17, CPE Next OV:  none

## 2018-04-13 NOTE — Telephone Encounter (Signed)
Sent. Thanks.   

## 2018-05-20 ENCOUNTER — Other Ambulatory Visit: Payer: Self-pay | Admitting: Family Medicine

## 2018-05-20 NOTE — Telephone Encounter (Signed)
Electronic refill request. Zolpidem Last office visit:   10/01/17 CPE Last Filled:    30 tablet 1 03/14/2018  Please advise.

## 2018-05-21 NOTE — Telephone Encounter (Signed)
Sent. Thanks.   

## 2018-06-06 ENCOUNTER — Other Ambulatory Visit: Payer: Self-pay | Admitting: Family Medicine

## 2018-06-07 NOTE — Telephone Encounter (Signed)
Electronic refill request Tizanidine Last office visit 10/01/17 Last refill 04/13/18 #60/1

## 2018-06-08 NOTE — Telephone Encounter (Signed)
Sent. Thanks.   

## 2018-06-15 ENCOUNTER — Other Ambulatory Visit: Payer: Self-pay | Admitting: Family Medicine

## 2018-06-15 NOTE — Telephone Encounter (Signed)
MyChart Refill Request:  Methylphenidate Last office visit:   10/01/17 Last Filled:    180 tablet 0 04/01/2018  Please advise.

## 2018-06-16 MED ORDER — METHYLPHENIDATE HCL 5 MG PO TABS: 10.0000 mg | ORAL_TABLET | Freq: Two times a day (BID) | ORAL | 0 refills | Status: DC

## 2018-06-16 NOTE — Telephone Encounter (Signed)
Sent. Thanks.   

## 2018-07-14 DIAGNOSIS — J019 Acute sinusitis, unspecified: Secondary | ICD-10-CM | POA: Diagnosis not present

## 2018-07-26 ENCOUNTER — Other Ambulatory Visit: Payer: Self-pay | Admitting: Family Medicine

## 2018-07-26 NOTE — Telephone Encounter (Signed)
Sent.  Needs yearly visit when possible.  Thanks.

## 2018-07-26 NOTE — Telephone Encounter (Signed)
Electronic refill request. Zolpidem Last office visit:   10/01/17 Last Filled:    30 tablet 1 05/21/2018  Please advise.

## 2018-07-27 ENCOUNTER — Encounter: Payer: Self-pay | Admitting: *Deleted

## 2018-07-27 ENCOUNTER — Other Ambulatory Visit: Payer: Self-pay | Admitting: Family Medicine

## 2018-07-27 NOTE — Telephone Encounter (Signed)
Letter mailed

## 2018-07-27 NOTE — Telephone Encounter (Signed)
Electronic refill request. Tizanidine Last office visit:   10/01/17 Last Filled:    60 tablet 1 06/08/2018  Please advise.

## 2018-07-28 NOTE — Telephone Encounter (Signed)
Sent. Thanks.   

## 2018-08-30 ENCOUNTER — Other Ambulatory Visit: Payer: Self-pay | Admitting: Family Medicine

## 2018-08-30 NOTE — Telephone Encounter (Signed)
Name of Medication: Ritalin Name of [facility] Last Fill or Written Date and Quantity: 06/16/18 #180 Last Office Visit and Type: 10/01/17 Next Office Visit and Type: 10/04/18 Medication review Last Controlled Substance Agreement Date: 04/15/15 Last UDS: 10/01/17

## 2018-08-31 MED ORDER — METHYLPHENIDATE HCL 5 MG PO TABS: 10.0000 mg | ORAL_TABLET | Freq: Two times a day (BID) | ORAL | 0 refills | Status: DC

## 2018-08-31 NOTE — Telephone Encounter (Signed)
Sent. Thanks.   

## 2018-09-11 ENCOUNTER — Other Ambulatory Visit: Payer: Self-pay | Admitting: Family Medicine

## 2018-09-13 NOTE — Telephone Encounter (Signed)
Electronic refill request. Tizanidine Last office visit:   10/01/17 Last Filled:    60 tablet 1 07/28/2018  Upcoming appointments scheduled but not CPE.  Please advise.

## 2018-09-14 ENCOUNTER — Encounter: Payer: Self-pay | Admitting: *Deleted

## 2018-09-14 NOTE — Telephone Encounter (Signed)
Sent. Thanks.  CPE when possible, would not schedule in flu season.

## 2018-09-14 NOTE — Telephone Encounter (Signed)
Letter mailed

## 2018-09-16 ENCOUNTER — Encounter: Payer: Self-pay | Admitting: Family Medicine

## 2018-09-16 ENCOUNTER — Other Ambulatory Visit: Payer: Self-pay

## 2018-09-16 ENCOUNTER — Ambulatory Visit: Payer: 59 | Admitting: Family Medicine

## 2018-09-16 DIAGNOSIS — M542 Cervicalgia: Secondary | ICD-10-CM

## 2018-09-20 DIAGNOSIS — M542 Cervicalgia: Secondary | ICD-10-CM | POA: Insufficient documentation

## 2018-09-20 NOTE — Assessment & Plan Note (Signed)
See scanned copy.  EMR down at time of office visit. 

## 2018-09-20 NOTE — Progress Notes (Signed)
See scanned copy.  EMR down at time of office visit. 

## 2018-09-21 ENCOUNTER — Encounter: Payer: Self-pay | Admitting: Family Medicine

## 2018-09-21 ENCOUNTER — Other Ambulatory Visit: Payer: Self-pay | Admitting: Family Medicine

## 2018-09-21 NOTE — Telephone Encounter (Signed)
Electronic refill request. Zolpidem Last office visit:   09/16/2018 Last Filled:    30 tablet 1 07/26/2018  Please advise.

## 2018-09-22 NOTE — Telephone Encounter (Signed)
Sent. Thanks.   

## 2018-09-25 ENCOUNTER — Other Ambulatory Visit: Payer: Self-pay | Admitting: Family Medicine

## 2018-09-27 NOTE — Telephone Encounter (Signed)
Electronic refill request. Tizanidine Last office visit:   09/16/2018 Neck pain Last Filled:    60 tablet 1 09/14/2018  Please advise.

## 2018-09-28 NOTE — Telephone Encounter (Signed)
Sent. Thanks.   

## 2018-10-04 ENCOUNTER — Ambulatory Visit (INDEPENDENT_AMBULATORY_CARE_PROVIDER_SITE_OTHER): Payer: 59 | Admitting: Family Medicine

## 2018-10-04 ENCOUNTER — Other Ambulatory Visit: Payer: Self-pay

## 2018-10-04 DIAGNOSIS — G43109 Migraine with aura, not intractable, without status migrainosus: Secondary | ICD-10-CM | POA: Diagnosis not present

## 2018-10-04 DIAGNOSIS — G4700 Insomnia, unspecified: Secondary | ICD-10-CM

## 2018-10-04 DIAGNOSIS — F988 Other specified behavioral and emotional disorders with onset usually occurring in childhood and adolescence: Secondary | ICD-10-CM | POA: Diagnosis not present

## 2018-10-04 MED ORDER — EPINEPHRINE 0.3 MG/0.3ML IJ SOAJ: 0.3000 mg | Freq: Once | INTRAMUSCULAR | 1 refills | Status: AC

## 2018-10-04 MED ORDER — METHYLPHENIDATE HCL 10 MG PO TABS: 15.0000 mg | ORAL_TABLET | Freq: Two times a day (BID) | ORAL | 0 refills | Status: DC

## 2018-10-04 NOTE — Assessment & Plan Note (Signed)
D/w pt about inc total dose to 15-20 mg BID.  She can continue to skip doses on the weekends.  Continue work on exercise. She may be able to use lower dose in the first few days of the workweek after skipping dose on the weekend.  adderall isn't affecting her sleep. rx sent.  She'll update me as needed.  Routine cautions given to patient.

## 2018-10-04 NOTE — Assessment & Plan Note (Signed)
Better recently, using prn imitrex.  She'll update me as needed.  No need to change meds at this point as overall frequency is lower than prev.

## 2018-10-04 NOTE — Progress Notes (Signed)
Virtual visit completed through WebEx.  Patient location: home  Provider location: [place] at [facility], office   She is working from home.  She is dealing with social changes due to pandemic.    Insomnia.  Taking ambien. No ADE on med.  Compliant. Her sleep is better with med recently.  She is getting 6-7 hours per night with med.  No troubles the next day.  No parasomnias.   ADD.  Still on adderall 15mg  BID w/o ADE and with some effect.  She had some dec in effect over the years.  She has noted more trouble with multitasking and refocusing/finishing tasks.  This is better working from home.  She skips doses on the weekends when not working.  She is working 6 AM to 3 PM and she has occ skipped her afternoon dose, if her schedule allows.  She has more effect on Mondays after taking the weekend off. No tremor.  She is doing on line exercise classes. D/w pt about diet and exercise.    Migraines d/w pt. Some occ HA but not severe recently.  She is managing and trying to limit triggers (flonase for allergies).  Using imitrex occ, prn, "not very often."  imitrex works when used.    Meds, vitals, and allergies reviewed.   ROS: Per HPI unless specifically indicated in ROS section   nad ncat Speech and respiration wnl.   Affect wnl.

## 2018-10-04 NOTE — Assessment & Plan Note (Signed)
Chronic issue.  Does better with ambien, no ADE on med. Is exercising.  Continue as is.  She'll update me as needed.  Routine cautions given to patient.

## 2018-10-12 ENCOUNTER — Other Ambulatory Visit: Payer: Self-pay | Admitting: Family Medicine

## 2018-10-13 NOTE — Telephone Encounter (Signed)
This was recently done.  What is the issue on the refill at this point?

## 2018-10-13 NOTE — Telephone Encounter (Signed)
Electronic refill request Zanaflex Last refill 09/28/18 #60/1 Last office visit 10/04/18

## 2018-10-30 ENCOUNTER — Other Ambulatory Visit: Payer: Self-pay | Admitting: Family Medicine

## 2018-11-01 NOTE — Telephone Encounter (Signed)
Pharmacy requests refill on: Tizanidine 4mg    LAST REFILL: #60, 1 refill on 09/28/18 LAST OV: 10/04/18  NEXT OV: None scheduled PHARMACY: [facility]    Rx refill requested too soon.  I have LM for patient (okay per DPR) to find out if she is out of medication or if pharmacy just requesting early as they also requested a fill on 10/12/18.   If patient is out of medication, would like to know how often she is using and if she is having an increase in pain symptoms.   FYI to Dr. Damita Dunnings.

## 2018-11-02 NOTE — Telephone Encounter (Signed)
Patient states that pharmacy has this on automatic refill and she does not need at this time, has plenty.  She will call in the future if and when she needs another refill.   Currently, pain is managed very well and she has no concerns and has not increased frequency of use.   I will refuse R/X as refill not appropriate at this time.  FYI only to Dr. Damita Dunnings.

## 2018-11-02 NOTE — Telephone Encounter (Signed)
Please see below.  Let me know.  Thanks.

## 2018-11-03 NOTE — Telephone Encounter (Signed)
Noted. Thanks.

## 2018-11-17 ENCOUNTER — Other Ambulatory Visit: Payer: Self-pay | Admitting: Family Medicine

## 2018-11-17 NOTE — Telephone Encounter (Signed)
Electronic refill request Tizanidine Last refill 09/28/18 #30/1 Last office visit 10/04/18

## 2018-11-17 NOTE — Telephone Encounter (Signed)
Sent. Thanks.   

## 2018-11-19 ENCOUNTER — Other Ambulatory Visit: Payer: Self-pay | Admitting: Family Medicine

## 2018-11-22 NOTE — Telephone Encounter (Signed)
Electronic refill request Ambien Last office visit 10/04/18 Last refill 09/22/18 #30/1

## 2018-11-23 NOTE — Telephone Encounter (Signed)
Sent. Thanks.   

## 2018-11-30 ENCOUNTER — Other Ambulatory Visit: Payer: Self-pay | Admitting: Family Medicine

## 2018-11-30 NOTE — Telephone Encounter (Signed)
Electronic refill request. Tizanidine Last office visit:   10/04/2018 Last Filled:    60 tablet 1 11/17/2018  Please advise.

## 2018-12-01 NOTE — Telephone Encounter (Signed)
Please check with pharmacy.  I denied the prescription in the meantime.  This was an automatic refill issue previously.  Thanks.

## 2018-12-01 NOTE — Telephone Encounter (Signed)
Was filled on 11/17/2018 with additional refill. Too early

## 2018-12-20 ENCOUNTER — Other Ambulatory Visit: Payer: Self-pay | Admitting: Neurology

## 2018-12-30 ENCOUNTER — Other Ambulatory Visit: Payer: Self-pay | Admitting: Family Medicine

## 2018-12-31 NOTE — Telephone Encounter (Signed)
Refill request for Tizanidine. LOV 10/04/2018. Last refilled on 11/17/2018 for 60 tablets with 1 refill

## 2019-01-01 NOTE — Telephone Encounter (Signed)
Sent. Thanks.   

## 2019-01-10 ENCOUNTER — Other Ambulatory Visit: Payer: Self-pay | Admitting: Family Medicine

## 2019-01-10 NOTE — Telephone Encounter (Signed)
Name of Medication:methylphenidate 10 mg Name of Pharmacy: [place] or Written Date and Quantity: #120 on 10/04/18. Last Office Visit and Type: 10/04/18 ADD Next Office Visit and Type: none scheduled Last Controlled Substance Agreement Date: none Last UDS:10/01/17  Dr Damita Dunnings is out of office until 01/17/19.

## 2019-01-10 NOTE — Telephone Encounter (Signed)
Will send to Dr Danise Mina to see if able to refill in Dr Reather Littler absence.

## 2019-01-11 MED ORDER — METHYLPHENIDATE HCL 10 MG PO TABS: 15.0000 mg | ORAL_TABLET | Freq: Two times a day (BID) | ORAL | 0 refills | Status: DC

## 2019-01-11 NOTE — Telephone Encounter (Signed)
Eprescribed.

## 2019-01-19 ENCOUNTER — Other Ambulatory Visit: Payer: Self-pay | Admitting: Family Medicine

## 2019-01-19 NOTE — Telephone Encounter (Signed)
Sent. Thanks.   

## 2019-01-19 NOTE — Telephone Encounter (Signed)
Electronic refill request Ambien Last office visit 10/04/18 Last refill 11/23/18 #30 x 1

## 2019-01-21 ENCOUNTER — Other Ambulatory Visit: Payer: Self-pay | Admitting: Family Medicine

## 2019-01-21 NOTE — Telephone Encounter (Signed)
Electronic refill request. Tizanidine Last office visit:   09/10/2018 Last Filled:    60 tablet 1 01/01/2019  Please advise.

## 2019-01-23 NOTE — Telephone Encounter (Signed)
This was recently sent.  Why did she need a refill so soon?  Please check with pharmacy.

## 2019-02-01 ENCOUNTER — Other Ambulatory Visit: Payer: Self-pay | Admitting: *Deleted

## 2019-02-01 NOTE — Telephone Encounter (Signed)
Faxed refill request. Tizanidine Last office visit:   10/04/2018 Last Filled:    60 tablet 1 01/01/2019  Please advise.

## 2019-02-02 NOTE — Telephone Encounter (Signed)
Thanks

## 2019-02-02 NOTE — Telephone Encounter (Signed)
Spoke with patient. [facility] to [facility] and to take it of automatic refill request. She does not need this refilled. This lasts her about a month, sometimes a little over just depends. [facility] do not [facility] this medication to be set on refill, not sure what is going on with the system. They have refills on file. He will reset their system and hopefully this will resolve. If medication request comes through again to ignore it, not sure of the issue.  FYI to Dr. Damita Dunnings

## 2019-02-02 NOTE — Telephone Encounter (Signed)
Please check with pharmacy.  I think we have gotten multiple requests about this since the last refill.  She should have 1 refill left.  Thanks.

## 2019-02-27 ENCOUNTER — Other Ambulatory Visit: Payer: Self-pay | Admitting: Family Medicine

## 2019-02-28 ENCOUNTER — Encounter: Payer: Self-pay | Admitting: Family Medicine

## 2019-02-28 NOTE — Telephone Encounter (Signed)
Electronic refill request. Tizanidine Last office visit:   10/04/2018 Last Filled:   60 tablet Refills: 1 Start: 01/01/2019 Please advise.

## 2019-03-01 NOTE — Telephone Encounter (Signed)
Sent. Thanks.  This was prev on auto refill but this is about the time she would normally be due.

## 2019-03-03 ENCOUNTER — Encounter: Payer: Self-pay | Admitting: Family Medicine

## 2019-03-03 NOTE — Progress Notes (Signed)
Please send a copy of this letter to patient after I have signed it.  Thanks.

## 2019-03-04 ENCOUNTER — Encounter: Payer: Self-pay | Admitting: Family Medicine

## 2019-03-13 ENCOUNTER — Other Ambulatory Visit: Payer: Self-pay | Admitting: Family Medicine

## 2019-03-19 ENCOUNTER — Other Ambulatory Visit: Payer: Self-pay | Admitting: Family Medicine

## 2019-03-21 ENCOUNTER — Other Ambulatory Visit: Payer: Self-pay | Admitting: Family Medicine

## 2019-03-21 NOTE — Telephone Encounter (Signed)
Last filled 02-17-19 #30 Last OV 10-04-18 No Future OV [facility]

## 2019-03-22 NOTE — Telephone Encounter (Signed)
Sent. Thanks.   

## 2019-03-22 NOTE — Telephone Encounter (Signed)
Electronic refill request. Methylphenidate Last office visit:   10/04/2018 Last Filled:    120 tablet 0 01/11/2019  Please advise.

## 2019-03-23 ENCOUNTER — Other Ambulatory Visit: Payer: Self-pay | Admitting: *Deleted

## 2019-03-23 MED ORDER — METHYLPHENIDATE HCL 10 MG PO TABS: 15.0000 mg | ORAL_TABLET | Freq: Two times a day (BID) | ORAL | 0 refills | Status: DC

## 2019-03-23 NOTE — Telephone Encounter (Signed)
Last office visit 10/04/2018 for ADD, Insomnia & Migraine.  Last refilled 03/01/2019 for #60 with 1 refill.  No future appointments.  Refill?

## 2019-03-23 NOTE — Telephone Encounter (Signed)
I think this is an auto refill issue that should not have come in already.  Please check with patient.  Thanks.

## 2019-03-23 NOTE — Telephone Encounter (Signed)
Sent. Thanks.   

## 2019-03-29 ENCOUNTER — Other Ambulatory Visit: Payer: Self-pay | Admitting: Family Medicine

## 2019-03-29 NOTE — Telephone Encounter (Signed)
Electronic refill request. Tizanidine Last office visit:   10/04/2018 Last Filled:    60 tablet 1 03/01/2019  Please advise.

## 2019-03-30 NOTE — Telephone Encounter (Signed)
I think we have gotten multiple requests about this (where it was on auto refill) before the patient actually needed a refill.  Please verify this with the patient.  Thanks.

## 2019-05-07 ENCOUNTER — Other Ambulatory Visit: Payer: Self-pay | Admitting: Family Medicine

## 2019-05-09 NOTE — Telephone Encounter (Signed)
Electronic refill request. Tizanidine Last office visit:   10/04/2018 Last Filled:   03/01/2019 Please advise.

## 2019-05-09 NOTE — Telephone Encounter (Signed)
Sent. Thanks.   

## 2019-05-24 ENCOUNTER — Other Ambulatory Visit: Payer: Self-pay | Admitting: Family Medicine

## 2019-05-24 NOTE — Telephone Encounter (Signed)
Electronic refill request. Tizanidine Last office visit:   10/04/2018 Last Filled:    60 tablet 1 05/09/2019  Please advise.

## 2019-06-09 ENCOUNTER — Other Ambulatory Visit: Payer: Self-pay | Admitting: Family Medicine

## 2019-06-09 NOTE — Telephone Encounter (Signed)
Electronic refill request. Methylphenidate Last office visit:   10/04/2018 Last Filled:    120 tablet 0 03/21/2019  Please advise.

## 2019-06-10 MED ORDER — METHYLPHENIDATE HCL 10 MG PO TABS: 15.0000 mg | ORAL_TABLET | Freq: Two times a day (BID) | ORAL | 0 refills | Status: DC

## 2019-06-10 NOTE — Telephone Encounter (Signed)
Sent. Thanks.   

## 2019-06-19 ENCOUNTER — Other Ambulatory Visit: Payer: Self-pay | Admitting: Family Medicine

## 2019-06-19 ENCOUNTER — Other Ambulatory Visit: Payer: Self-pay | Admitting: Neurology

## 2019-06-20 NOTE — Telephone Encounter (Signed)
Electronic refill request. Zolpidem Last office visit:   10/04/2018 Last Filled:    30 tablet 2 03/22/2019  Please advise.

## 2019-06-21 NOTE — Telephone Encounter (Signed)
Sent. Thanks.   

## 2019-06-25 ENCOUNTER — Other Ambulatory Visit: Payer: Self-pay | Admitting: Family Medicine

## 2019-06-25 NOTE — Telephone Encounter (Signed)
Last filled 06-10-19 #60 Last OV 10-04-18 No Future OV [facility]

## 2019-06-26 NOTE — Telephone Encounter (Signed)
Sent. Thanks.   

## 2019-07-17 ENCOUNTER — Other Ambulatory Visit: Payer: Self-pay | Admitting: Family Medicine

## 2019-07-18 NOTE — Telephone Encounter (Signed)
LOV 10/04/2018  No future appointments made. Last filled on 06/26/2019 #60 with 1 refill

## 2019-07-19 NOTE — Telephone Encounter (Signed)
Please check with pharmacy/patient and see if this is on auto refill again.  I think this is an early request.  Thanks.

## 2019-08-03 ENCOUNTER — Other Ambulatory Visit: Payer: Self-pay | Admitting: Family Medicine

## 2019-08-03 MED ORDER — METHYLPHENIDATE HCL 10 MG PO TABS: 15.0000 mg | ORAL_TABLET | Freq: Two times a day (BID) | ORAL | 0 refills | Status: DC

## 2019-08-03 NOTE — Telephone Encounter (Signed)
Sent. Thanks.  Needs CPE the spring when possible.

## 2019-08-03 NOTE — Telephone Encounter (Signed)
Name of Medication: Ritalin Name of Pharmacy: [place] or Written Date and Quantity: 06/10/2019 #120 with 0 refill Last Office Visit and Type: LOV 10/04/2018 Next Office Visit and Type: no future appointments Last Controlled Substance Agreement Date: 04/25/2015 Last UDS: 10/01/2017

## 2019-08-04 NOTE — Telephone Encounter (Signed)
Left a voicemail for patient. Still needs to schedule CPE and Labs.

## 2019-08-09 NOTE — Telephone Encounter (Signed)
Patient scheduled.

## 2019-09-10 ENCOUNTER — Other Ambulatory Visit: Payer: Self-pay | Admitting: Family Medicine

## 2019-09-12 NOTE — Telephone Encounter (Signed)
Last refilled on 06/26/2019 #60 with 1 refill  LOV 10/04/2018 Next appointment on 10/13/2019 for Physical

## 2019-09-13 NOTE — Telephone Encounter (Signed)
Sent. Thanks.   

## 2019-09-25 ENCOUNTER — Other Ambulatory Visit: Payer: Self-pay | Admitting: Family Medicine

## 2019-09-26 ENCOUNTER — Other Ambulatory Visit: Payer: Self-pay | Admitting: Family Medicine

## 2019-09-26 NOTE — Telephone Encounter (Signed)
Electronic refill request. Methylphenidate Last office visit:   10/04/2018 04/25/2015  Controlled Substance Contract signed Last Filled:    120 tablet 0 08/03/2019  Please advise.

## 2019-09-27 ENCOUNTER — Other Ambulatory Visit: Payer: Self-pay | Admitting: Family Medicine

## 2019-09-27 MED ORDER — METHYLPHENIDATE HCL 10 MG PO TABS: 15.0000 mg | ORAL_TABLET | Freq: Two times a day (BID) | ORAL | 0 refills | Status: DC

## 2019-09-27 NOTE — Telephone Encounter (Signed)
Electronic refill request. Zolpidem Last office visit:   10/04/2018 Last Filled:    30 tablet 2 06/21/2019  Please advise.

## 2019-09-27 NOTE — Telephone Encounter (Signed)
Electronic refill request. Tizanidine Last office visit:   10/04/2018 Last Filled:    60 tablet 1 09/13/2019  Please advise.

## 2019-09-27 NOTE — Telephone Encounter (Signed)
Sent. Thanks.  Has follow-up pending 

## 2019-09-28 NOTE — Telephone Encounter (Signed)
Check with pharmacy.  We often get auto refill request and she likely does not need this redone at this point.  Thanks.

## 2019-09-28 NOTE — Telephone Encounter (Signed)
Sent. Thanks.   

## 2019-09-28 NOTE — Telephone Encounter (Signed)
Spoke to [name] at the pharmacy and was advised that she does not know why the refill request went out because the patient does have a refill left. [name] stated that she will get the refill ready for the patient today. [name] stated to disregard the request.

## 2019-10-05 ENCOUNTER — Other Ambulatory Visit: Payer: Self-pay | Admitting: Family Medicine

## 2019-10-05 NOTE — Telephone Encounter (Signed)
She should have enough in the meantime.  I declined it.  Thanks.

## 2019-10-05 NOTE — Telephone Encounter (Signed)
Electronic refill request Tizanidine  Last office visit 10/04/18 Upcoming appointment 10/13/19 Last refill 09/13/19 #60/1 Spoke with pharmacist and was advised that she picked a script up on 09/20/19 and last refill on 09/28/19

## 2019-10-10 ENCOUNTER — Encounter: Payer: Self-pay | Admitting: Family Medicine

## 2019-10-13 ENCOUNTER — Telehealth (INDEPENDENT_AMBULATORY_CARE_PROVIDER_SITE_OTHER): Payer: 59 | Admitting: Family Medicine

## 2019-10-13 ENCOUNTER — Encounter: Payer: Self-pay | Admitting: Family Medicine

## 2019-10-13 ENCOUNTER — Other Ambulatory Visit: Payer: Self-pay

## 2019-10-13 VITALS — Temp 97.5°F | Ht 68.0 in | Wt 211.0 lb

## 2019-10-13 DIAGNOSIS — R0683 Snoring: Secondary | ICD-10-CM

## 2019-10-13 DIAGNOSIS — M545 Low back pain, unspecified: Secondary | ICD-10-CM

## 2019-10-13 DIAGNOSIS — G4700 Insomnia, unspecified: Secondary | ICD-10-CM | POA: Diagnosis not present

## 2019-10-13 DIAGNOSIS — Z0001 Encounter for general adult medical examination with abnormal findings: Secondary | ICD-10-CM

## 2019-10-13 DIAGNOSIS — G8929 Other chronic pain: Secondary | ICD-10-CM

## 2019-10-13 DIAGNOSIS — G43109 Migraine with aura, not intractable, without status migrainosus: Secondary | ICD-10-CM | POA: Diagnosis not present

## 2019-10-13 DIAGNOSIS — F988 Other specified behavioral and emotional disorders with onset usually occurring in childhood and adolescence: Secondary | ICD-10-CM | POA: Diagnosis not present

## 2019-10-13 DIAGNOSIS — Z13220 Encounter for screening for lipoid disorders: Secondary | ICD-10-CM

## 2019-10-13 DIAGNOSIS — Z131 Encounter for screening for diabetes mellitus: Secondary | ICD-10-CM

## 2019-10-13 DIAGNOSIS — E049 Nontoxic goiter, unspecified: Secondary | ICD-10-CM

## 2019-10-13 DIAGNOSIS — Z7189 Other specified counseling: Secondary | ICD-10-CM

## 2019-10-13 NOTE — Progress Notes (Signed)
Interactive audio and video telecommunications were attempted between this provider and patient, however failed, due to patient having technical difficulties OR patient did not have access to video capability.  We continued and completed visit with audio only.   Virtual Visit via Telephone Note  I connected with patient on 10/13/19  at 8:38 AM  by telephone and verified that I am speaking with the correct person using two identifiers.  Location of patient:  Home  Location of MD: [facility] Name of referring provider (if blank then none associated): Names per persons and role in encounter:  MD: Earlyne Iba, Patient: name listed above.    I discussed the limitations, risks, security and privacy concerns of performing an evaluation and management service by telephone and the availability of in person appointments. I also discussed with the patient that there may be a patient responsible charge related to this service. The patient expressed understanding and agreed to proceed.  CC: CPE.    History of Present Illness:   ADD.  Still on ritalin.  No AE on med.  It helps.  Her work situation would be "night and day different" off the medicine.  She clearly sees a difference if she missed a dose.  She uses reminders.  No tremor.    Insomnia.  No ADE on ambien.  No ADE on med. Discussed sleep hygiene and sleep apnea. She has some snoring with pause noted by husband.  She has some HA and fatigue.  Pulmonary referral ordered.  She had trouble with autorefill with tizanidine.  Med works w/o ADE.  She is checked with pharmacy about correcting auto refill request.  H/o migraines.  Had used imitrex prn but ubrelvy worked better.  Discussed options.  Discussed referral.  She wanted to see migraine clinic in [place], [place] [facility].    She has some thyroid enlargement at baseline.  No change per patient report.    She wanted to get her hearing checked.  She has seasonal  allergies and h/o noise exposure.  We talked about getting her routine labs checked.  Labs are ordered.  We will call about setting up a nurse visit/lab visit.  Tetanus 2015 Flu- she was able to get egg free vaccine done at work prev.  PNA and shingles not due.  Mammogram d/w pt.  Pap not indicated. S/p hysterectomy. TAH and BSO. No hot flashes usually.   DXA 2019. Living will d/w pt. Husband designated if patient were incapacitated.  Diet and exercise d/w pt.   Pt d/w pt re: HIV screening. Prev done 2005. covid vaccine d/w pt.  She should be able to get vaccine done, d/w pt.   I gave her contact info for [place] at [facility].  [place]  Observations/Objective: nad Speech wnl  Assessment and Plan:  ADD.  Still on ritalin.  No AE on med.  It helps.  Her work situation would be "night and day different" off the medicine.  She clearly sees a difference if she missed a dose.  She uses reminders.  No tremor.  Would continue as is.  Insomnia.  No ADE on ambien.  No ADE on med. Discussed sleep hygiene and sleep apnea. She has some snoring with pause noted by husband.  She has some HA and fatigue.  Pulmonary referral ordered.  Would continue Ambien as is.  She had trouble with autorefill with tizanidine.  Med works  w/o ADE.  She is checked with pharmacy about correcting auto refill request.  Continue tizanidine as is.  H/o migraines.  Had used imitrex prn but ubrelvy worked better.  Discussed options.  Discussed referral.  She wanted to see migraine clinic in [place], [place] [facility].  Referral ordered.  She has some thyroid enlargement at baseline.  No change per patient report.  She can update me as needed.  She wanted to get her hearing checked.  She has seasonal allergies and h/o noise exposure.  We talked about getting her routine labs checked.  Labs are ordered.  We will call about  setting up a nurse visit/lab visit.  Tetanus 2015 Flu- she was able to get egg free vaccine done at work prev.  PNA and shingles not due.  Mammogram d/w pt.  Pap not indicated. S/p hysterectomy. TAH and BSO. No hot flashes usually.   DXA 2019. Living will d/w pt. Husband designated if patient were incapacitated.  Diet and exercise d/w pt.   Pt d/w pt re: HIV screening. Prev done 2005. covid vaccine d/w pt.  She should be able to get vaccine done, d/w pt.   I gave her contact info for [place] at [facility].  [place]  Follow Up Instructions: See above.   I discussed the assessment and treatment plan with the patient. The patient was provided an opportunity to ask questions and all were answered. The patient agreed with the plan and demonstrated an understanding of the instructions.   The patient was advised to call back or seek an in-person evaluation if the symptoms worsen or if the condition fails to improve as anticipated.  I provided 30 minutes of non-face-to-face time during this encounter.  Elsie Stain, MD

## 2019-10-16 DIAGNOSIS — R0683 Snoring: Secondary | ICD-10-CM | POA: Insufficient documentation

## 2019-10-16 DIAGNOSIS — G4733 Obstructive sleep apnea (adult) (pediatric): Secondary | ICD-10-CM | POA: Insufficient documentation

## 2019-10-16 NOTE — Assessment & Plan Note (Signed)
Tetanus 2015 Flu- she was able to get egg free vaccine done at work prev.  PNA and shingles not due.  Mammogram d/w pt.  Pap not indicated. S/p hysterectomy. TAH and BSO. No hot flashes usually.   DXA 2019. Living will d/w pt. Husband designated if patient were incapacitated.  Diet and exercise d/w pt.   Pt d/w pt re: HIV screening. Prev done 2005. covid vaccine d/w pt.  She should be able to get vaccine done, d/w pt.   I gave her contact info for [place] at [facility].  [place]

## 2019-10-16 NOTE — Assessment & Plan Note (Signed)
Refer for sleep apnea testing.

## 2019-10-16 NOTE — Assessment & Plan Note (Signed)
Refer.  She agrees.

## 2019-10-16 NOTE — Assessment & Plan Note (Signed)
Continue Ritalin.  She agrees.

## 2019-10-16 NOTE — Assessment & Plan Note (Signed)
Living will d/w pt.  Husband designated if patient were incapacitated.  

## 2019-10-16 NOTE — Assessment & Plan Note (Signed)
Continue tizanidine.  Update me as needed.  She agrees.

## 2019-10-16 NOTE — Assessment & Plan Note (Signed)
Continue Ambien.  No adverse effect on medication.

## 2019-11-23 ENCOUNTER — Ambulatory Visit: Payer: 59 | Admitting: Acute Care

## 2019-11-23 ENCOUNTER — Encounter: Payer: Self-pay | Admitting: Acute Care

## 2019-11-23 ENCOUNTER — Other Ambulatory Visit: Payer: Self-pay

## 2019-11-23 VITALS — BP 124/84 | HR 95 | Temp 97.5°F | Ht 68.0 in | Wt 231.8 lb

## 2019-11-23 DIAGNOSIS — R0683 Snoring: Secondary | ICD-10-CM | POA: Diagnosis not present

## 2019-11-23 NOTE — Patient Instructions (Signed)
It is good to meet you today. We will place an order for a home sleep study. You will get a call to get this scheduled.  We will schedule a tele visit after sleep study to discuss results. Please call the office once the study is done, so you can be scheduled for a follow tele visit.  Continue to work on weight loss, as the link between excess weight  and sleep apnea is well established.   Remember to establish a good bedtime routine, and work on sleep hygiene.  Limit daytime naps , avoid stimulants such as caffeine and nicotine close to bedtime, exercise daily to promote sleep quality, avoid heavy , spicy, fried , or rich foods before bed. Ensure adequate exposure to natural light during the day,establish a relaxing bedtime routine with a pleasant sleep environment ( Bedroom between 60 and 67 degrees, turn off bright lights , TV or device screens , consider black out curtains or white noise machines) Do not drive if sleepy.  Follow up with [name]  tele visit to discuss results  once your sleep study has been completed  or before as needed.

## 2019-11-23 NOTE — Progress Notes (Signed)
History of Present Illness [name] is a 39 y.o. female former smoker ( Quit 2017) with a 17 pack year smoking history. She has headaches and daytime  fatigue. She is here for evaluation of OSA. She will be followed by Dr. Mortimer Fries.   11/23/2019 Sleep Consult. Pt. States he has had trouble with sleep since she was 27 after she had a hysterectomy. She states she had weight gain after her hysterectomy. She has a Epworth score of 4 based on her questionnaire today. She sleeps restlessly with loud snoring and wakes up gasping for air. She wakes up 3-4 times every night. She goes to bed at 10 pm. It takes at least one hour to fall asleep. She gets out of bed at 6 am.She states her weight has been stable. She does have a latex allergy Her father has OSA and he uses a CPAP machine. 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  She has a history of migraine headaches, fatigue . She states she wakes up with a headache most days. Her PCP is aware of the link between OSA and migraines, and this is why she has been sent for referral. He feels there may be cross over OSA.   Test Results:  CBC Latest Ref Rng & Units 01/05/2018 10/01/2017 04/22/2017  WBC 4.0 - 10.5 K/uL 10.6(H) 11.2(H) 11.7(H)  Hemoglobin 12.0 - 15.0 g/dL 15.0 15.1(H) 12.0  Hematocrit 36.0 - 46.0 % 44.1 45.1 35.5(L)  Platelets 150 - 400 K/uL 408(H) 376.0 341    BMP Latest Ref Rng & Units 01/05/2018 04/22/2017 09/29/2016  Glucose 70 - 99 mg/dL 94 89 94  BUN 6 - 20 mg/dL 17 15 16   Creatinine 0.44 - 1.00 mg/dL 0.79 0.80 0.97  Sodium 135 - 145 mmol/L 140 138 139  Potassium 3.5 - 5.1 mmol/L 3.9 4.5 4.4  Chloride 98 - 111 mmol/L 105 103 105  CO2 22 - 32 mmol/L 28 26 29   Calcium 8.9 - 10.3 mg/dL 9.3 9.2 9.8    BNP No results found for: BNP  ProBNP No results found for: PROBNP  PFT No results found for: FEV1PRE, FEV1POST, FVCPRE,  FVCPOST, TLC, DLCOUNC, PREFEV1FVCRT, PSTFEV1FVCRT  No results found.   Past medical hx Past Medical History:  Diagnosis Date  . ADD (attention deficit disorder)    testing done 2016  . ALLERGIC RHINITIS 10/11/2009  . Allergy to latex 04/11/2010  . ANKLE PAIN, RIGHT 04/03/2009  . BACK PAIN 03/19/2009  . COMMON MIGRAINE 03/19/2009  . FLANK PAIN, RIGHT 05/11/2009  . GLUCOSE INTOLERANCE 03/19/2009  . HYDRONEPHROSIS, RIGHT 05/11/2009  . INSOMNIA-SLEEP DISORDER-UNSPEC 03/19/2009  . Left tibial fracture 2018  . Migraine   . NEPHROLITHIASIS, HX OF 03/19/2009  . RENAL CALCULUS, RIGHT 05/11/2009     Social History   Tobacco Use  . Smoking status: Former Smoker    Types: Cigarettes    Quit date: 2017    Years since quitting: 4.3  . Smokeless tobacco: Never Used  Substance Use Topics  . Alcohol use: Yes    Alcohol/week: 0.0 standard drinks    Comment: rare  . Drug use: No    [name] reports that she quit smoking about 4 years ago. Her smoking use included cigarettes. She has never used smokeless tobacco. She reports current alcohol use. She reports that she does not use drugs.  Tobacco Cessation:  Former smoker , quit 2017 with a 17 pack year smoking history  Past surgical hx, Family hx, Social hx all reviewed.  Current Outpatient Medications on File Prior to Visit  Medication Sig  . calcium-vitamin D (OSCAL WITH D) 500-200 MG-UNIT tablet Take 1 tablet by mouth daily with breakfast.  . EPINEPHrine 0.3 mg/0.3 mL IJ SOAJ injection Inject 0.3 mg into the muscle as needed for anaphylaxis.  . fluticasone (FLONASE) 50 MCG/ACT nasal spray Place 2 sprays into both nostrils daily.  . methylphenidate (RITALIN) 10 MG tablet Take 1.5-2 tablets (15-20 mg total) by mouth 2 (two) times daily.  Marland Kitchen tiZANidine (ZANAFLEX) 4 MG tablet TAKE 1 TABLET BY MOUTH EVERY 6 HOURS AS NEEDED  . zolpidem (AMBIEN CR) 12.5 MG CR tablet TAKE 1 TABLET (12.5 MG TOTAL) BY MOUTH AT BEDTIME.   No current  facility-administered medications on file prior to visit.     Allergies  Allergen Reactions  . Amoxicillin-Pot Clavulanate Swelling    Facial swelling Has patient had a PCN reaction causing immediate rash, facial/tongue/throat swelling, SOB or lightheadedness with hypotension: Yes Has patient had a PCN reaction causing severe rash involving mucus membranes or skin necrosis:Unknown Has patient had a PCN reaction that required hospitalization: No Has patient had a PCN reaction occurring within the last 10 years: No If all of the above answers are "NO", then may proceed with Cephalosporin use.   . Latex     Carries epipen  . Aspirin Other (See Comments)    Swelling, itching, rash  . Eggs Or Egg-Derived Products Other (See Comments)    Prev positive allergy testing   . Ibuprofen     Patient reports swelling, rash and itching.   . Nsaids     Patient reports swelling, rash and itching.  . Other Other (See Comments)    Skin lotion- skin allergy  . Red Dye     rash    Review Of Systems:  Constitutional:   No  weight loss, night sweats,  Fevers, chills,+  fatigue, or  lassitude.  HEENT:   + headaches,  Difficulty swallowing,  Tooth/dental problems, or  Sore throat,                No sneezing, itching, ear ache, nasal congestion, post nasal drip,   CV:  No chest pain,  Orthopnea, PND, swelling in lower extremities, anasarca, dizziness, palpitations, syncope.   GI  No heartburn, indigestion, abdominal pain, nausea, vomiting, diarrhea, change in bowel habits, loss of appetite, bloody stools.   Resp: No shortness of breath with exertion or at rest.  No excess mucus, no productive cough,  No non-productive cough,  No coughing up of blood.  No change in color of mucus.  No wheezing.  No chest wall deformity  Skin: no rash or lesions.  GU: no dysuria, change in color of urine, no urgency or frequency.  No flank pain, no hematuria   MS:  No joint pain or swelling.  No decreased range of  motion.  No back pain.  Psych:  No change in mood or affect. No depression or anxiety.  No memory loss.   Vital Signs BP 124/84 (BP Location: Left Arm, Cuff Size: Normal)   Pulse 95   Temp (!) 97.5 F (36.4 C) (Temporal)   Ht 5\' 8"  (1.727 m)   Wt 231 lb 12.8 oz (105.1 kg)   SpO2 95%   BMI 35.25 kg/m    Physical Exam:  General- No distress,  A&Ox3,  pleasant ENT: No sinus tenderness, TM clear, pale nasal mucosa, no oral exudate,no post nasal drip, no LAN Cardiac: S1, S2, regular rate and rhythm, no murmur Chest: No wheeze/ rales/ dullness; no accessory muscle use, no nasal flaring, no sternal retractions Abd.: Soft Non-tender, ND, BS +, Body mass index is 35.25 kg/m. Ext: No clubbing cyanosis, no edema, no obvious deformities Neuro:  normal strength, MAE x 4, A&O x 3, appropriate Skin: No rashes, No lesions, warm and dry Psych: normal mood and behavior   Assessment/Plan  Suspected OSA in patient with Migraines Plan We will place an order for a home sleep study. You will get a call to get this scheduled.  We will schedule a tele visit after sleep study to discuss results. Please call the office once the study is done, so you can be scheduled for a follow tele visit.  Continue to work on weight loss, as the link between excess weight  and sleep apnea is well established.   Remember to establish a good bedtime routine, and work on sleep hygiene.  Limit daytime naps , avoid stimulants such as caffeine and nicotine close to bedtime, exercise daily to promote sleep quality, avoid heavy , spicy, fried , or rich foods before bed. Ensure adequate exposure to natural light during the day,establish a relaxing bedtime routine with a pleasant sleep environment ( Bedroom between 60 and 67 degrees, turn off bright lights , TV or device screens , consider black out curtains or white noise machines) Do not drive if sleepy.  Follow up with Judson Roch NP  tele visit to discuss results  once your  sleep study has been completed  or before as needed.    This appointment was 30 min long with over 50% of the time in direct face-to-face patient care, assessment, plan of care, and follow-up.    Magdalen Spatz, NP 11/23/2019  3:54 PM

## 2019-11-24 ENCOUNTER — Encounter: Payer: Self-pay | Admitting: Acute Care

## 2019-11-25 ENCOUNTER — Ambulatory Visit: Payer: 59 | Attending: Internal Medicine

## 2019-11-25 DIAGNOSIS — Z23 Encounter for immunization: Secondary | ICD-10-CM

## 2019-11-25 NOTE — Progress Notes (Signed)
   Covid-19 Vaccination Clinic  Name:  [name]    MRN: [id number] DOB: July 31, 1980  11/25/2019  [name] was observed post Covid-19 immunization for 15 minutes without incident. She was provided with Vaccine Information Sheet and instruction to access the V-Safe system.   [name] was instructed to call 911 with any severe reactions post vaccine: Marland Kitchen Difficulty breathing  . Swelling of face and throat  . A fast heartbeat  . A bad rash all over body  . Dizziness and weakness   Immunizations Administered    Name Date Dose VIS Date Route   Pfizer COVID-19 Vaccine 11/25/2019  8:39 AM 0.3 mL 08/31/2018 Intramuscular   Manufacturer: [place]   Lot: [id number]   [place]: [id number]

## 2019-11-28 ENCOUNTER — Other Ambulatory Visit: Payer: Self-pay | Admitting: Family Medicine

## 2019-11-29 ENCOUNTER — Encounter: Payer: Self-pay | Admitting: Family Medicine

## 2019-11-29 NOTE — Telephone Encounter (Signed)
Refill request tizanidine Last refill 09/13/19 #60/1 Last office visit 10/23/19 virtual visit

## 2019-11-30 ENCOUNTER — Other Ambulatory Visit: Payer: Self-pay | Admitting: Family Medicine

## 2019-11-30 MED ORDER — BENZONATATE 200 MG PO CAPS: 200.0000 mg | ORAL_CAPSULE | Freq: Three times a day (TID) | ORAL | 1 refills | Status: DC | PRN

## 2019-11-30 NOTE — Telephone Encounter (Signed)
Noted.  Thanks.  See my chart message sent to patient.

## 2019-11-30 NOTE — Telephone Encounter (Signed)
Please call patient and triage her about her recent symptoms.  Thanks.

## 2019-11-30 NOTE — Telephone Encounter (Signed)
I spoke with pt; pt had first [place] covid vaccine on 11/25/19, on 11/27/19 developed prod cough with clear phlegm; on 11/30/19 pt developed H/A at forehead; pain level 2 now.pt has had runny nose, S/T, and pt has a lot of chest congestion with wheezing at times. Pt does not have fever.pt [facility] and had a rapid covid test which was negative. Pt has been taking Tylenol and Mucinex DM Max for cough. Pt does not have CP when takes a deep breath. Mucinex DM Max is not helping cough; pt said the [facility] at [facility] listened to her lungs and pt was advised she had a lot of congestion in her lungs with wheezing; when pt coughs up the phlegm the wheezing stops until congestion builds up in lungs again. Pt said pulse ox was OK at [facility]. (pt does not know what pulse ox was only that it was OK.) Now on phone when pt coughs, the cough sounds very deep. . Pt does not have any other covid symptoms. Pt is drinking plenty of fluids. Pt wants to know if a prescription cough med could be sent to [place]. Pt did not schedule a virtual appt until Dr Damita Dunnings reviews this note to advise her what to do; take med and see how does, schedule virtual or come into office. Pt said since she has had her lungs listened to today she does not think she needs to go to UC. I did give UC & ED precautions and pt voiced understanding.

## 2019-11-30 NOTE — Telephone Encounter (Signed)
Sent. Thanks.   

## 2019-12-01 ENCOUNTER — Institutional Professional Consult (permissible substitution): Payer: 59 | Admitting: Pulmonary Disease

## 2019-12-08 ENCOUNTER — Other Ambulatory Visit: Payer: Self-pay | Admitting: Family Medicine

## 2019-12-14 ENCOUNTER — Other Ambulatory Visit: Payer: Self-pay | Admitting: Family Medicine

## 2019-12-14 MED ORDER — METHYLPHENIDATE HCL 10 MG PO TABS: 15.0000 mg | ORAL_TABLET | Freq: Two times a day (BID) | ORAL | 0 refills | Status: DC

## 2019-12-14 NOTE — Telephone Encounter (Signed)
Last office visit 10/13/2019 for CPE.  Last refilled 09/27/2019 for #120 with no refills.  No future appointments.

## 2019-12-14 NOTE — Telephone Encounter (Signed)
Sent. Thanks.   

## 2019-12-14 NOTE — Telephone Encounter (Signed)
Refill request Epinephrine Last office visit 10/13/19 virtual Last refill 10/04/18

## 2019-12-20 ENCOUNTER — Ambulatory Visit: Payer: Self-pay

## 2019-12-20 ENCOUNTER — Ambulatory Visit: Payer: 59 | Attending: Internal Medicine

## 2019-12-20 DIAGNOSIS — Z23 Encounter for immunization: Secondary | ICD-10-CM

## 2019-12-20 NOTE — Progress Notes (Addendum)
   Covid-19 Vaccination Clinic  Name:  [name]    MRN: [id number] DOB: Dec 06, 1980  12/20/2019  [name] was observed post Covid-19 immunization for 30 minutes without incident. She was provided with Vaccine Information Sheet and instruction to access the V-Safe system.   [name] was instructed to call 911 with any severe reactions post vaccine: Marland Kitchen Difficulty breathing  . Swelling of face and throat  . A fast heartbeat  . A bad rash all over body  . Dizziness and weakness   Immunizations Administered    Name Date Dose VIS Date Route   Pfizer COVID-19 Vaccine 12/20/2019  3:39 PM 0.3 mL 08/31/2018 Intramuscular   Manufacturer: [place]   Lot: [id number]   [place]: [address]-3

## 2019-12-26 ENCOUNTER — Other Ambulatory Visit: Payer: Self-pay | Admitting: Family Medicine

## 2019-12-26 ENCOUNTER — Telehealth: Payer: Self-pay | Admitting: Acute Care

## 2019-12-26 NOTE — Telephone Encounter (Signed)
She was actually seen in [place] by Judson Roch but when I called her I offered for her to pick up the HST machine her in [place] . She is scheduled to pick up machine 12/27/2019 @ 3:30pm

## 2019-12-27 ENCOUNTER — Ambulatory Visit: Payer: 59

## 2019-12-27 ENCOUNTER — Telehealth: Payer: Self-pay | Admitting: Acute Care

## 2019-12-27 NOTE — Telephone Encounter (Signed)
Electronic refill request. Zolpidem Last office visit:   10/04/2018   No upcoming appts scheduled. Last Filled:    30 tablet 2 09/28/2019  Please advise.

## 2019-12-28 ENCOUNTER — Ambulatory Visit: Payer: 59

## 2019-12-28 ENCOUNTER — Other Ambulatory Visit: Payer: Self-pay | Admitting: Family Medicine

## 2019-12-28 ENCOUNTER — Other Ambulatory Visit: Payer: Self-pay

## 2019-12-28 DIAGNOSIS — R0683 Snoring: Secondary | ICD-10-CM

## 2019-12-28 DIAGNOSIS — G4733 Obstructive sleep apnea (adult) (pediatric): Secondary | ICD-10-CM | POA: Diagnosis not present

## 2019-12-28 NOTE — Telephone Encounter (Signed)
This may be on autorefill.  Please verify need with patient.  Thanks.

## 2019-12-28 NOTE — Telephone Encounter (Signed)
Last office visit 10/13/2019 for CPE.  Last refilled 11/30/2019 for #60 with 1 refill.  No future appointment with PCP.

## 2019-12-28 NOTE — Telephone Encounter (Signed)
Sent. Thanks.   

## 2019-12-28 NOTE — Telephone Encounter (Signed)
Spoke with [employer].  She states she doesn't need a refill currently but will probably need one in about 15 days.  I told her I was going to deny this request and for her to let us know when she does actually need a refill.

## 2019-12-28 NOTE — Telephone Encounter (Signed)
Agreed.  Thanks.  

## 2020-01-03 NOTE — Telephone Encounter (Signed)
Patient calling to confirm appointment is a televisit, confirmed.

## 2020-01-06 ENCOUNTER — Other Ambulatory Visit: Payer: Self-pay

## 2020-01-06 ENCOUNTER — Ambulatory Visit: Payer: 59 | Admitting: Acute Care

## 2020-01-09 DIAGNOSIS — G4733 Obstructive sleep apnea (adult) (pediatric): Secondary | ICD-10-CM | POA: Diagnosis not present

## 2020-01-19 ENCOUNTER — Encounter: Payer: Self-pay | Admitting: Adult Health

## 2020-01-19 ENCOUNTER — Other Ambulatory Visit: Payer: Self-pay

## 2020-01-19 ENCOUNTER — Ambulatory Visit (INDEPENDENT_AMBULATORY_CARE_PROVIDER_SITE_OTHER): Payer: 59 | Admitting: Adult Health

## 2020-01-19 DIAGNOSIS — G4733 Obstructive sleep apnea (adult) (pediatric): Secondary | ICD-10-CM | POA: Diagnosis not present

## 2020-01-19 NOTE — Progress Notes (Signed)
Virtual Visit via Telephone Note  I connected with [name] on 01/19/20 at 12:00 PM EDT by telephone and verified that I am speaking with the correct person using two identifiers.  Location: Patient: Home  Provider: Office    I discussed the limitations, risks, security and privacy concerns of performing an evaluation and management service by telephone and the availability of in person appointments. I also discussed with the patient that there may be a patient responsible charge related to this service. The patient expressed understanding and agreed to proceed.   History of Present Illness: 39 year old female seen for sleep consult Nov 23, 2019 for daytime fatigue, headaches and concern for possible underlying sleep apnea.  Today's televisit is a follow-up to discuss home sleep study results.. Patient was seen last visit for a sleep consult for daytime fatigue and headaches.  There was concern for a possible underlying sleep apnea.  Patient was set up for a home sleep study.  Home sleep study on December 28, 2019 that showed severe sleep apnea with an AHI at 60.8, SPO2 low 59%.  We discussed her sleep study results.  Went over recommendations for CPAP titration study. Patient does have a history of attention deficit disorder and is on daily Ritalin.  She also has insomnia and takes Ambien at bedtime.  Patient Active Problem List   Diagnosis Date Noted   Snoring 10/16/2019   Chronic migraine 12/22/2017   Advance care planning 10/02/2017   BRBPR (bright red blood per rectum) 09/30/2016   Encounter for general adult medical examination with abnormal findings 08/30/2015   Pain in joint, shoulder region 08/30/2015   ADD (attention deficit disorder) 06/20/2014   Obesity (BMI 30-39.9) 08/15/2013   Stress incontinence 08/15/2013   Anxiety state, unspecified 08/15/2013   Chronic back pain 03/23/2013   Nephrolithiasis 12/31/2012   LATEX ALLERGY 04/11/2010   ALLERGIC RHINITIS  10/11/2009   Migraine with aura 03/19/2009   Insomnia 03/19/2009    Current Outpatient Medications on File Prior to Visit  Medication Sig Dispense Refill   benzonatate (TESSALON) 200 MG capsule Take 1 capsule (200 mg total) by mouth 3 (three) times daily as needed. 30 capsule 1   calcium-vitamin D (OSCAL WITH D) 500-200 MG-UNIT tablet Take 1 tablet by mouth daily with breakfast.     EPINEPHRINE 0.3 mg/0.3 mL IJ SOAJ injection INJECT 0.3 MLS INTO THE MUSCLE ONCE FOR 1 DOSE. 2 each 1   fluticasone (FLONASE) 50 MCG/ACT nasal spray Place 2 sprays into both nostrils daily. 16 g 6   methylphenidate (RITALIN) 10 MG tablet Take 1.5-2 tablets (15-20 mg total) by mouth 2 (two) times daily. 120 tablet 0   tiZANidine (ZANAFLEX) 4 MG tablet TAKE 1 TABLET BY MOUTH EVERY 6 HOURS AS NEEDED 60 tablet 1   zolpidem (AMBIEN CR) 12.5 MG CR tablet TAKE 1 TABLET (12.5 MG TOTAL) BY MOUTH AT BEDTIME. 30 tablet 2   No current facility-administered medications on file prior to visit.       Observations/Objective: Speaks in full sentenes   Assessment and Plan: OSA -severe-patient has severe sleep apnea with hypoxemia.  We will set patient up for CPAP titration study. Advised on healthy sleep regimen.  Work on healthy weight loss.  Plan  Patient Instructions  Healthy sleep regimen. Set up for a CPAP titration study. Do not drive if sleepy Work on healthy weight loss. Follow-up in 3 months with Dr. Halford Chessman or APP in [place] or [place] and As needed  Follow Up Instructions: Follow up in 3 months and As needed      I discussed the assessment and treatment plan with the patient. The patient was provided an opportunity to ask questions and all were answered. The patient agreed with the plan and demonstrated an understanding of the instructions.   The patient was advised to call back or seek an in-person evaluation if the symptoms worsen or if the condition fails to improve as  anticipated.  I provided 22  minutes of non-face-to-face time during this encounter.   Rexene Edison, NP

## 2020-01-19 NOTE — Progress Notes (Signed)
Reviewed and agree with assessment/plan.   Chesley Mires, MD [facility] Pulmonary/Critical Care 01/19/2020, 12:20 PM Pager:  [phone]

## 2020-01-19 NOTE — Patient Instructions (Addendum)
Healthy sleep regimen. Set up for a CPAP titration study. Do not drive if sleepy Work on healthy weight loss. Follow-up in 3 months with Dr. Halford Chessman or APP in [place] or [place] and As needed

## 2020-01-27 ENCOUNTER — Other Ambulatory Visit: Payer: Self-pay | Admitting: Family Medicine

## 2020-01-27 DIAGNOSIS — Z1231 Encounter for screening mammogram for malignant neoplasm of breast: Secondary | ICD-10-CM

## 2020-01-29 ENCOUNTER — Other Ambulatory Visit: Payer: Self-pay | Admitting: Family Medicine

## 2020-01-30 NOTE — Telephone Encounter (Signed)
Electronic refill request. Tizanidine Last office visit:   10/04/2018 Last Filled:    60 tablet 1 11/30/2019  Please advise.

## 2020-01-31 NOTE — Telephone Encounter (Signed)
Sent. Thanks.   

## 2020-02-03 ENCOUNTER — Ambulatory Visit
Admission: RE | Admit: 2020-02-03 | Discharge: 2020-02-03 | Disposition: A | Discharge status: Home / Self Care | Payer: 59 | Source: Ambulatory Visit

## 2020-02-03 ENCOUNTER — Other Ambulatory Visit: Payer: Self-pay

## 2020-02-03 DIAGNOSIS — Z1231 Encounter for screening mammogram for malignant neoplasm of breast: Secondary | ICD-10-CM

## 2020-02-07 ENCOUNTER — Other Ambulatory Visit: Payer: Self-pay | Admitting: Family Medicine

## 2020-02-21 ENCOUNTER — Ambulatory Visit: Payer: 59 | Attending: Pulmonary Disease

## 2020-02-21 DIAGNOSIS — G4733 Obstructive sleep apnea (adult) (pediatric): Secondary | ICD-10-CM | POA: Insufficient documentation

## 2020-02-21 DIAGNOSIS — G43909 Migraine, unspecified, not intractable, without status migrainosus: Secondary | ICD-10-CM | POA: Diagnosis not present

## 2020-02-21 DIAGNOSIS — G4700 Insomnia, unspecified: Secondary | ICD-10-CM | POA: Insufficient documentation

## 2020-02-22 ENCOUNTER — Other Ambulatory Visit: Payer: Self-pay

## 2020-02-23 ENCOUNTER — Other Ambulatory Visit: Payer: Self-pay | Admitting: Family Medicine

## 2020-02-23 NOTE — Telephone Encounter (Signed)
Name of Medication: Methylphenidate [facility] Last Fill or Written Date and Quantity:   120 tablet 0 12/14/2019  Last Office Visit and Type: 10/04/2018 Next Office Visit and Type: None Last Controlled Substance Agreement Date: 04/25/2015 Last UDS:  10/01/2017

## 2020-02-24 ENCOUNTER — Telehealth: Payer: Self-pay | Admitting: Adult Health

## 2020-02-24 DIAGNOSIS — G4733 Obstructive sleep apnea (adult) (pediatric): Secondary | ICD-10-CM

## 2020-02-24 MED ORDER — METHYLPHENIDATE HCL 10 MG PO TABS: 15.0000 mg | ORAL_TABLET | Freq: Two times a day (BID) | ORAL | 0 refills | Status: DC

## 2020-02-24 NOTE — Telephone Encounter (Signed)
Spoke with patient and advised that the test had not been read by the provider, however, I would send a message to Tammy Parrett letting her know that the patient was requesting the results.  Patient told they would be called after we had heard back from the provider.  Patient verbalized understanding.  Nothing further needed.

## 2020-02-24 NOTE — Telephone Encounter (Signed)
Sent. Thanks.   

## 2020-02-24 NOTE — Telephone Encounter (Signed)
Test has not been read yet , will call when available

## 2020-02-28 DIAGNOSIS — G4733 Obstructive sleep apnea (adult) (pediatric): Secondary | ICD-10-CM | POA: Diagnosis not present

## 2020-03-02 NOTE — Telephone Encounter (Signed)
Titration study has been reviewed by Dr. Elsworth Soho.  recommend cpap 12cm h2O, avoid bedtime alcohol and sedatives, follow up with referring physician and maintain lead body weight.   Left message for patient.

## 2020-03-05 ENCOUNTER — Encounter: Payer: Self-pay | Admitting: Family Medicine

## 2020-03-05 NOTE — Telephone Encounter (Signed)
Spoke with the pt and notified of results/recs  She verbalized understanding  Order sent to [facility]  Recall for f/u was placed

## 2020-03-05 NOTE — Telephone Encounter (Signed)
Patient is returning phone call. Patient phone number is [phone].

## 2020-03-06 ENCOUNTER — Telehealth: Payer: Self-pay | Admitting: Adult Health

## 2020-03-06 ENCOUNTER — Other Ambulatory Visit: Payer: Self-pay | Admitting: Family Medicine

## 2020-03-06 NOTE — Telephone Encounter (Signed)
Electronic refill request. Tizanidine Last office visit:   10/04/2018 Last Filled:    60 tablet 1 01/31/2020

## 2020-03-06 NOTE — Telephone Encounter (Signed)
Titration study shows optimal control at CPAP 12 cm, sm FF mask   Please begin CPAP at 12cm , sm FF mask  Enroll in [place].   Ov in 2 months for f/up

## 2020-03-07 ENCOUNTER — Other Ambulatory Visit: Payer: Self-pay | Admitting: Family Medicine

## 2020-03-07 DIAGNOSIS — R748 Abnormal levels of other serum enzymes: Secondary | ICD-10-CM

## 2020-03-07 NOTE — Telephone Encounter (Signed)
Sent. Thanks.   

## 2020-03-07 NOTE — Telephone Encounter (Signed)
Called and left message for patient to return call.  

## 2020-03-07 NOTE — Telephone Encounter (Signed)
Pt informed of results again  See phone note 02/24/20--already ordered CPAP based on these results  Recall placed for f/u

## 2020-03-19 ENCOUNTER — Other Ambulatory Visit: Payer: Self-pay

## 2020-03-19 ENCOUNTER — Ambulatory Visit
Admission: RE | Admit: 2020-03-19 | Discharge: 2020-03-19 | Disposition: A | Discharge status: Home / Self Care | Payer: 59 | Source: Ambulatory Visit | Attending: Family Medicine | Admitting: Family Medicine

## 2020-03-19 DIAGNOSIS — R748 Abnormal levels of other serum enzymes: Secondary | ICD-10-CM | POA: Insufficient documentation

## 2020-03-24 ENCOUNTER — Other Ambulatory Visit: Payer: Self-pay | Admitting: Family Medicine

## 2020-03-26 NOTE — Telephone Encounter (Signed)
Electronic refill request. Tizanidine Last office visit:   10/04/2018  No upcoming appts scheduled. Last Filled:    60 tablet 1 03/07/2020

## 2020-03-27 NOTE — Telephone Encounter (Signed)
This is likely on autorefill and she shouldn't need refill yet at this point.

## 2020-03-28 ENCOUNTER — Other Ambulatory Visit: Payer: Self-pay | Admitting: Family Medicine

## 2020-03-28 DIAGNOSIS — R739 Hyperglycemia, unspecified: Secondary | ICD-10-CM

## 2020-04-03 ENCOUNTER — Other Ambulatory Visit: Payer: Self-pay | Admitting: Family Medicine

## 2020-04-03 NOTE — Telephone Encounter (Signed)
Electronic refill request. Tizanidine Last office visit:   10/04/2018 Last Filled:    60 tablet 1 03/07/2020

## 2020-04-04 ENCOUNTER — Other Ambulatory Visit: Payer: Self-pay | Admitting: Family Medicine

## 2020-04-04 NOTE — Telephone Encounter (Signed)
Prev sent.

## 2020-04-05 NOTE — Telephone Encounter (Signed)
Electronic refill request. Zolpidem Last office visit:   10/04/2018 Last Filled:    30 tablet 2 12/28/2019

## 2020-04-06 NOTE — Telephone Encounter (Signed)
Sent. Thanks.   

## 2020-04-10 ENCOUNTER — Other Ambulatory Visit (INDEPENDENT_AMBULATORY_CARE_PROVIDER_SITE_OTHER): Payer: 59

## 2020-04-10 ENCOUNTER — Other Ambulatory Visit: Payer: Self-pay

## 2020-04-10 DIAGNOSIS — R739 Hyperglycemia, unspecified: Secondary | ICD-10-CM | POA: Diagnosis not present

## 2020-04-10 LAB — COMPREHENSIVE METABOLIC PANEL
ALT: 24 U/L (ref 0–35)
AST: 19 U/L (ref 0–37)
Albumin: 4.4 g/dL (ref 3.5–5.2)
Alkaline Phosphatase: 103 U/L (ref 39–117)
BUN: 17 mg/dL (ref 6–23)
CO2: 26 mEq/L (ref 19–32)
Calcium: 9.6 mg/dL (ref 8.4–10.5)
Chloride: 106 mEq/L (ref 96–112)
Creatinine, Ser: 1.21 mg/dL — ABNORMAL HIGH (ref 0.40–1.20)
GFR: 55.84 mL/min — ABNORMAL LOW (ref >60.00)
Glucose, Bld: 97 mg/dL (ref 70–99)
Potassium: 4.1 mEq/L (ref 3.5–5.1)
Sodium: 139 mEq/L (ref 135–145)
Total Bilirubin: 0.3 mg/dL (ref 0.2–1.2)
Total Protein: 7.7 g/dL (ref 6.0–8.3)

## 2020-04-10 LAB — LIPID PANEL
Cholesterol: 205 mg/dL — ABNORMAL HIGH (ref 0–200)
HDL: 35.3 mg/dL — ABNORMAL LOW (ref >39.00)
LDL Cholesterol: 143 mg/dL — ABNORMAL HIGH (ref 0–99)
NonHDL: 170.19
Total CHOL/HDL Ratio: 6
Triglycerides: 134 mg/dL (ref 0.0–149.0)
VLDL: 26.8 mg/dL (ref 0.0–40.0)

## 2020-04-15 LAB — INSULIN, FREE AND TOTAL
Free Insulin: 23 uU/mL — ABNORMAL HIGH
Total Insulin: 23 uU/mL

## 2020-04-16 ENCOUNTER — Encounter: Payer: Self-pay | Admitting: Family Medicine

## 2020-04-18 ENCOUNTER — Other Ambulatory Visit: Payer: Self-pay | Admitting: Family Medicine

## 2020-04-18 DIAGNOSIS — R7989 Other specified abnormal findings of blood chemistry: Secondary | ICD-10-CM

## 2020-04-20 ENCOUNTER — Other Ambulatory Visit: Payer: Self-pay | Admitting: Family Medicine

## 2020-04-20 NOTE — Telephone Encounter (Signed)
Electronic refill request. Methylphenidate Last office visit:   10/04/2018 Last Filled:    120 tablet 0 02/24/2020

## 2020-04-23 MED ORDER — METHYLPHENIDATE HCL 10 MG PO TABS: 15.0000 mg | ORAL_TABLET | Freq: Two times a day (BID) | ORAL | 0 refills | Status: DC

## 2020-04-23 NOTE — Telephone Encounter (Signed)
Sent. Thanks.   

## 2020-06-04 ENCOUNTER — Other Ambulatory Visit: Payer: Self-pay | Admitting: Family Medicine

## 2020-06-04 NOTE — Telephone Encounter (Signed)
Please Advise

## 2020-06-05 MED ORDER — METHYLPHENIDATE HCL 10 MG PO TABS: 15.0000 mg | ORAL_TABLET | Freq: Two times a day (BID) | ORAL | 0 refills | Status: DC

## 2020-06-05 NOTE — Telephone Encounter (Signed)
Sent. Thanks.   

## 2020-06-07 ENCOUNTER — Emergency Department
Admission: EM | Admit: 2020-06-07 | Discharge: 2020-06-07 | Disposition: A | Discharge status: Home / Self Care | Payer: 59 | Attending: Emergency Medicine | Admitting: Emergency Medicine

## 2020-06-07 ENCOUNTER — Telehealth: Payer: Self-pay | Admitting: Family Medicine

## 2020-06-07 ENCOUNTER — Other Ambulatory Visit: Payer: Self-pay

## 2020-06-07 ENCOUNTER — Emergency Department: Payer: 59

## 2020-06-07 DIAGNOSIS — Z87891 Personal history of nicotine dependence: Secondary | ICD-10-CM | POA: Diagnosis not present

## 2020-06-07 DIAGNOSIS — Z91040 Latex allergy status: Secondary | ICD-10-CM | POA: Insufficient documentation

## 2020-06-07 DIAGNOSIS — Z20822 Contact with and (suspected) exposure to covid-19: Secondary | ICD-10-CM | POA: Diagnosis not present

## 2020-06-07 DIAGNOSIS — K8500 Idiopathic acute pancreatitis without necrosis or infection: Secondary | ICD-10-CM | POA: Diagnosis not present

## 2020-06-07 DIAGNOSIS — R1013 Epigastric pain: Secondary | ICD-10-CM | POA: Diagnosis present

## 2020-06-07 LAB — COMPREHENSIVE METABOLIC PANEL
ALT: 36 U/L (ref 0–44)
AST: 24 U/L (ref 15–41)
Albumin: 4.2 g/dL (ref 3.5–5.0)
Alkaline Phosphatase: 92 U/L (ref 38–126)
Anion gap: 10 (ref 5–15)
BUN: 14 mg/dL (ref 6–20)
CO2: 24 mmol/L (ref 22–32)
Calcium: 9.3 mg/dL (ref 8.9–10.3)
Chloride: 106 mmol/L (ref 98–111)
Creatinine, Ser: 0.91 mg/dL (ref 0.44–1.00)
GFR, Estimated: >60 mL/min (ref >60)
Glucose, Bld: 100 mg/dL — ABNORMAL HIGH (ref 70–99)
Potassium: 3.9 mmol/L (ref 3.5–5.1)
Sodium: 140 mmol/L (ref 135–145)
Total Bilirubin: 0.5 mg/dL (ref 0.3–1.2)
Total Protein: 7.6 g/dL (ref 6.5–8.1)

## 2020-06-07 LAB — RESP PANEL BY RT-PCR (FLU A&B, COVID) ARPGX2
Influenza A by PCR: NEGATIVE
Influenza B by PCR: NEGATIVE
SARS Coronavirus 2 by RT PCR: NEGATIVE

## 2020-06-07 LAB — URINALYSIS, COMPLETE (UACMP) WITH MICROSCOPIC
Bilirubin Urine: NEGATIVE
Glucose, UA: NEGATIVE mg/dL
Ketones, ur: NEGATIVE mg/dL
Leukocytes,Ua: NEGATIVE
Nitrite: NEGATIVE
Protein, ur: NEGATIVE mg/dL
Specific Gravity, Urine: 1.008 (ref 1.005–1.030)
pH: 6 (ref 5.0–8.0)

## 2020-06-07 LAB — CBC
HCT: 45.2 % (ref 36.0–46.0)
Hemoglobin: 14.8 g/dL (ref 12.0–15.0)
MCH: 28.4 pg (ref 26.0–34.0)
MCHC: 32.7 g/dL (ref 30.0–36.0)
MCV: 86.6 fL (ref 80.0–100.0)
Platelets: 413 10*3/uL — ABNORMAL HIGH (ref 150–400)
RBC: 5.22 MIL/uL — ABNORMAL HIGH (ref 3.87–5.11)
RDW: 13.2 % (ref 11.5–15.5)
WBC: 12.2 10*3/uL — ABNORMAL HIGH (ref 4.0–10.5)
nRBC: 0 % (ref 0.0–0.2)

## 2020-06-07 LAB — LIPASE, BLOOD: Lipase: 248 U/L — ABNORMAL HIGH (ref 11–51)

## 2020-06-07 MED ORDER — ONDANSETRON HCL 4 MG/2ML IJ SOLN
4.0000 mg | Freq: Once | INTRAMUSCULAR | Status: AC
  Administered 2020-06-07: 4 mg via INTRAVENOUS
  Filled 2020-06-07: qty 2

## 2020-06-07 MED ORDER — ONDANSETRON 4 MG PO TBDP: 4.0000 mg | ORAL_TABLET | Freq: Three times a day (TID) | ORAL | 0 refills | Status: DC | PRN

## 2020-06-07 MED ORDER — MORPHINE SULFATE (PF) 4 MG/ML IV SOLN
4.0000 mg | Freq: Once | INTRAVENOUS | Status: AC
  Administered 2020-06-07: 4 mg via INTRAVENOUS
  Filled 2020-06-07: qty 1

## 2020-06-07 MED ORDER — OXYCODONE-ACETAMINOPHEN 5-325 MG PO TABS: 1.0000 | ORAL_TABLET | ORAL | 0 refills | Status: DC | PRN

## 2020-06-07 MED ORDER — IOHEXOL 300 MG/ML  SOLN
100.0000 mL | Freq: Once | INTRAMUSCULAR | Status: AC | PRN
  Administered 2020-06-07: 100 mL via INTRAVENOUS
  Filled 2020-06-07: qty 100

## 2020-06-07 NOTE — ED Provider Notes (Signed)
[facility] Emergency Department Provider Note  ____________________________________________   First MD Initiated Contact with Patient 06/07/20 1757     (approximate)  I have reviewed the triage vital signs and the nursing notes.   HISTORY  Chief Complaint Abdominal Pain    HPI Kylani Wires is a 39 y.o. female presents emergency department sudden onset of abdominal pain.  States had some burning with urination also.  Patient has history of kidney stones.  She was recently started on medication for high cholesterol.  States they did an ultrasound and did not see any gallstones prior to placing her on the medication. denies etoh use   Past Medical History:  Diagnosis Date  . ADD (attention deficit disorder)    testing done 2016  . ALLERGIC RHINITIS 10/11/2009  . Allergy to latex 04/11/2010  . ANKLE PAIN, RIGHT 04/03/2009  . BACK PAIN 03/19/2009  . COMMON MIGRAINE 03/19/2009  . FLANK PAIN, RIGHT 05/11/2009  . GLUCOSE INTOLERANCE 03/19/2009  . HYDRONEPHROSIS, RIGHT 05/11/2009  . INSOMNIA-SLEEP DISORDER-UNSPEC 03/19/2009  . Left tibial fracture 2018  . Migraine   . NEPHROLITHIASIS, HX OF 03/19/2009  . RENAL CALCULUS, RIGHT 05/11/2009    Patient Active Problem List   Diagnosis Date Noted  . Snoring 10/16/2019  . Chronic migraine 12/22/2017  . Advance care planning 10/02/2017  . BRBPR (bright red blood per rectum) 09/30/2016  . Encounter for general adult medical examination with abnormal findings 08/30/2015  . Pain in joint, shoulder region 08/30/2015  . ADD (attention deficit disorder) 06/20/2014  . Obesity (BMI 30-39.9) 08/15/2013  . Stress incontinence 08/15/2013  . Anxiety state, unspecified 08/15/2013  . Chronic back pain 03/23/2013  . Nephrolithiasis 12/31/2012  . LATEX ALLERGY 04/11/2010  . ALLERGIC RHINITIS 10/11/2009  . Migraine with aura 03/19/2009  . Insomnia 03/19/2009    Past Surgical History:  Procedure Laterality Date  .  ABDOMINAL HYSTERECTOMY    . APPENDECTOMY  1994  . CYSTOSCOPY W/ URETERAL STENT PLACEMENT Left 09/24/2014   Procedure: CYSTOSCOPY WITH RETROGRADE PYELOGRAM/LEFT URETERAL STENT PLACEMENT;  Surgeon: Raynelle Bring, MD;  Location: [facility] ORS;  Service: Urology;  Laterality: Left;  . EYE SURGERY Bilateral    lens replacement 2014  . hx of etopic pregnancy  2005  . LITHOTRIPSY  2011  . OOPHORECTOMY  07/2008  . s/p left knee arthroscopic  1994    Prior to Admission medications   Medication Sig Start Date End Date Taking? Authorizing Provider  benzonatate (TESSALON) 200 MG capsule Take 1 capsule (200 mg total) by mouth 3 (three) times daily as needed. Patient not taking: Reported on 01/19/2020 11/30/19   Tonia Ghent, MD  calcium-vitamin D (OSCAL WITH D) 500-200 MG-UNIT tablet Take 1 tablet by mouth daily with breakfast.    [provider]  EPINEPHRINE 0.3 mg/0.3 mL IJ SOAJ injection INJECT 0.3 MLS INTO THE MUSCLE ONCE FOR 1 DOSE. 12/14/19   Tonia Ghent, MD  fluticasone (FLONASE) 50 MCG/ACT nasal spray Place 2 sprays into both nostrils daily. 04/10/17   Elby Beck, FNP  methylphenidate (RITALIN) 10 MG tablet Take 1.5-2 tablets (15-20 mg total) by mouth 2 (two) times daily. 06/05/20   Tonia Ghent, MD  ondansetron (ZOFRAN-ODT) 4 MG disintegrating tablet Take 1 tablet (4 mg total) by mouth every 8 (eight) hours as needed. 06/07/20   Crystelle Ferrufino, Linden Dolin, PA-C  oxyCODONE-acetaminophen (PERCOCET) 5-325 MG tablet Take 1 tablet by mouth every 4 (four) hours as needed for severe pain. 06/07/20  06/07/21  Adair Lauderback, Linden Dolin, PA-C  tiZANidine (ZANAFLEX) 4 MG tablet TAKE 1 TABLET BY MOUTH EVERY 6 HOURS AS NEEDED 03/07/20   Tonia Ghent, MD  zolpidem (AMBIEN CR) 12.5 MG CR tablet TAKE 1 TABLET BY MOUTH AT BEDTIME. 04/06/20   Tonia Ghent, MD    Allergies Amoxicillin-pot clavulanate, Latex, Aspirin, Eggs or egg-derived products, Ibuprofen, Nsaids, Other, and Red dye  Family History  Problem  Relation Age of Onset  . Alcohol abuse Mother        ETOH  . Bipolar disorder Mother        manic depressive/bipolar  . Thyroid disease Mother        Low thyroid  . Hypertension Father   . Hypertension Other   . Diabetes Other   . Heart disease Other   . Stroke Other   . Cancer Other        Lung Cancer  . Colon cancer Maternal Grandmother   . Breast cancer Paternal Grandmother     Social History Social History   Tobacco Use  . Smoking status: Former Smoker    Packs/day: 1.00    Years: 17.00    Pack years: 17.00    Types: Cigarettes    Quit date: 2017    Years since quitting: 4.9  . Smokeless tobacco: Never Used  Vaping Use  . Vaping Use: Never used  Substance Use Topics  . Alcohol use: Yes    Alcohol/week: 0.0 standard drinks    Comment: rare  . Drug use: No    Review of Systems  Constitutional: No fever/chills Eyes: No visual changes. ENT: No sore throat. Respiratory: Denies cough Cardiovascular: Denies chest pain Gastrointestinal: Positive abdominal pain Genitourinary: Positive for dysuria. Musculoskeletal: Negative for back pain. Skin: Negative for rash. Psychiatric: no mood changes,     ____________________________________________   PHYSICAL EXAM:  VITAL SIGNS: ED Triage Vitals  Enc Vitals Group     BP 06/07/20 1553 135/73     Pulse Rate 06/07/20 1553 88     Resp 06/07/20 1553 18     Temp 06/07/20 1553 98.6 F (37 C)     Temp src --      SpO2 06/07/20 1553 100 %     Weight --      Height --      Head Circumference --      Peak Flow --      Pain Score 06/07/20 1555 4     Pain Loc --      Pain Edu? --      Excl. in [place]? --     Constitutional: Alert and oriented. Well appearing and in no acute distress. Eyes: Conjunctivae are normal.  Head: Atraumatic. Nose: No congestion/rhinnorhea. Mouth/Throat: Mucous membranes are moist.   Neck:  supple no lymphadenopathy noted Cardiovascular: Normal rate, regular rhythm. Heart sounds are  normal Respiratory: Normal respiratory effort.  No retractions, lungs c t a  Abd: soft tender in epigastric to mid abdomen, bs normal all 4 quad GU: deferred Musculoskeletal: FROM all extremities, warm and well perfused Neurologic:  Normal speech and language.  Skin:  Skin is warm, dry and intact. No rash noted. Psychiatric: Mood and affect are normal. Speech and behavior are normal.  ____________________________________________   LABS (all labs ordered are listed, but only abnormal results are displayed)  Labs Reviewed  LIPASE, BLOOD - Abnormal; Notable for the following components:      Result Value   Lipase 248 (*)  All other components within normal limits  COMPREHENSIVE METABOLIC PANEL - Abnormal; Notable for the following components:   Glucose, Bld 100 (*)    All other components within normal limits  CBC - Abnormal; Notable for the following components:   WBC 12.2 (*)    RBC 5.22 (*)    Platelets 413 (*)    All other components within normal limits  URINALYSIS, COMPLETE (UACMP) WITH MICROSCOPIC - Abnormal; Notable for the following components:   Color, Urine YELLOW (*)    APPearance HAZY (*)    Hgb urine dipstick SMALL (*)    Bacteria, UA RARE (*)    All other components within normal limits  RESP PANEL BY RT-PCR (FLU A&B, COVID) [id number]   ____________________________________________   ____________________________________________  RADIOLOGY  CT abdomen/pelvis with IV contrast  ____________________________________________   PROCEDURES  Procedure(s) performed: No  Procedures    ____________________________________________   INITIAL IMPRESSION / ASSESSMENT AND PLAN / ED COURSE  Pertinent labs & imaging results that were available during my care of the patient were reviewed by me and considered in my medical decision making (see chart for details).   Patient is a 39 year old female presents emergency department complaining of abdominal pain.  See HPI.   Physical exam shows abdomen to be tender.  DDx: Acute pancreatitis, acute cholecystitis, acute appendicitis, kidney stone  Lipase elevated 248, CBC with elevated WBC of 12.2, comprehensive metabolic panel has increased glucose of 100, urinalysis is basically normal Covid test ordered   CT abdomen/pelvis showed benign renal calculi that are nonobstructing. I did review the images and the radiology report  On recheck of the patient she still appears to be stable.  No increasing abdominal pain.  I did explain the CT findings to the patient.  Explained to her that she has pancreatitis and it may be due to the new medication that she had started ozmepic.  She should stop taking this medication until she is seen her regular physician.  She can take the pain medication as needed for pain.  Zofran for nausea as needed.  Drink plenty of fluids.  Return the emergency department if worsening.  She was given strict instructions to return if increasing abdominal pain, nausea or vomiting.  Explained to her this could be signs of worsening pancreatitis.  She states she understands.  The patient was discharged in stable condition in the care of her husband.  [name] was evaluated in Emergency Department on 06/07/2020 for the symptoms described in the history of present illness. She was evaluated in the context of the global COVID-19 pandemic, which necessitated consideration that the patient might be at risk for infection with the SARS-CoV-2 virus that causes COVID-19. Institutional protocols and algorithms that pertain to the evaluation of patients at risk for COVID-19 are in a state of rapid change based on information released by regulatory bodies including the CDC and federal and state organizations. These policies and algorithms were followed during the patient's care in the ED.    As part of my medical decision making, I reviewed the following data within the [facility]  notes reviewed and incorporated, Labs reviewed , Old chart reviewed, Radiograph reviewed , Notes from prior ED visits and [place] Controlled Substance Database  ____________________________________________   FINAL CLINICAL IMPRESSION(S) / ED DIAGNOSES  Final diagnoses:  Idiopathic acute pancreatitis without infection or necrosis      NEW MEDICATIONS STARTED DURING THIS VISIT:  New Prescriptions   ONDANSETRON (ZOFRAN-ODT) 4  MG DISINTEGRATING TABLET    Take 1 tablet (4 mg total) by mouth every 8 (eight) hours as needed.   OXYCODONE-ACETAMINOPHEN (PERCOCET) 5-325 MG TABLET    Take 1 tablet by mouth every 4 (four) hours as needed for severe pain.     Note:  This document was prepared using Dragon voice recognition software and may include unintentional dictation errors.    Versie Starks, PA-C 06/07/20 1919    Merlyn Lot, MD 06/07/20 Curly Rim

## 2020-06-07 NOTE — Discharge Instructions (Addendum)
Stop taking the ozempic, call your regular doctor for a follow up appointment Return to the ER if worsening Take the pain medication as needed, zofran for nausea as needed, drink plenty of water

## 2020-06-07 NOTE — ED Triage Notes (Signed)
Pt comes via POV from home with c/o abdominal pain. Pt states some burning with urination. Pt unsure of kidney stone.  Pt states some loose stools last week.

## 2020-06-07 NOTE — ED Notes (Signed)
E-signature pad not functional. 

## 2020-06-07 NOTE — Telephone Encounter (Signed)
Was seen in ER.  Please get update on patient early 06/08/20.  Please triage patient and update me about her current sx/condition.  Thanks.

## 2020-06-08 NOTE — Telephone Encounter (Addendum)
I spoke with pt; pt is doing better than yesterday; pain med is helping the abd pain. Pt took oxy apap last night and another this morning; but pt is not needing to take q4h prn as prescribed. Pt said ED provider advised pt that pain is probably due to pancreatitis. Pt scheduled ED FU on 06/11/20 at 8 AM. Pt is OK with this appt. UC & ED precautions given and pt voiced  Understanding. FYI to Dr Damita Dunnings.

## 2020-06-08 NOTE — Telephone Encounter (Signed)
Noted. Thanks.

## 2020-06-11 ENCOUNTER — Encounter: Payer: Self-pay | Admitting: Family Medicine

## 2020-06-11 ENCOUNTER — Other Ambulatory Visit: Payer: Self-pay

## 2020-06-11 ENCOUNTER — Ambulatory Visit: Payer: 59 | Admitting: Family Medicine

## 2020-06-11 VITALS — BP 120/90 | HR 91 | Temp 96.4°F | Ht 67.0 in | Wt 196.4 lb

## 2020-06-11 DIAGNOSIS — K859 Acute pancreatitis without necrosis or infection, unspecified: Secondary | ICD-10-CM

## 2020-06-11 DIAGNOSIS — R300 Dysuria: Secondary | ICD-10-CM

## 2020-06-11 LAB — COMPREHENSIVE METABOLIC PANEL
ALT: 27 U/L (ref 0–35)
AST: 22 U/L (ref 0–37)
Albumin: 4.5 g/dL (ref 3.5–5.2)
Alkaline Phosphatase: 82 U/L (ref 39–117)
BUN: 14 mg/dL (ref 6–23)
CO2: 26 mEq/L (ref 19–32)
Calcium: 9.7 mg/dL (ref 8.4–10.5)
Chloride: 106 mEq/L (ref 96–112)
Creatinine, Ser: 1.04 mg/dL (ref 0.40–1.20)
GFR: 67.29 mL/min (ref >60.00)
Glucose, Bld: 89 mg/dL (ref 70–99)
Potassium: 4.3 mEq/L (ref 3.5–5.1)
Sodium: 140 mEq/L (ref 135–145)
Total Bilirubin: 0.3 mg/dL (ref 0.2–1.2)
Total Protein: 7.7 g/dL (ref 6.0–8.3)

## 2020-06-11 LAB — CBC WITH DIFFERENTIAL/PLATELET
Basophils Absolute: 0.1 10*3/uL (ref 0.0–0.1)
Basophils Relative: 0.5 % (ref 0.0–3.0)
Eosinophils Absolute: 0.4 10*3/uL (ref 0.0–0.7)
Eosinophils Relative: 4 % (ref 0.0–5.0)
HCT: 45.2 % (ref 36.0–46.0)
Hemoglobin: 15.1 g/dL — ABNORMAL HIGH (ref 12.0–15.0)
Lymphocytes Relative: 29.5 % (ref 12.0–46.0)
Lymphs Abs: 2.9 10*3/uL (ref 0.7–4.0)
MCHC: 33.5 g/dL (ref 30.0–36.0)
MCV: 85 fl (ref 78.0–100.0)
Monocytes Absolute: 0.7 10*3/uL (ref 0.1–1.0)
Monocytes Relative: 7.4 % (ref 3.0–12.0)
Neutro Abs: 5.7 10*3/uL (ref 1.4–7.7)
Neutrophils Relative %: 58.6 % (ref 43.0–77.0)
Platelets: 397 10*3/uL (ref 150.0–400.0)
RBC: 5.32 Mil/uL — ABNORMAL HIGH (ref 3.87–5.11)
RDW: 13.4 % (ref 11.5–15.5)
WBC: 9.7 10*3/uL (ref 4.0–10.5)

## 2020-06-11 LAB — LIPASE: Lipase: 93 U/L — ABNORMAL HIGH (ref 11.0–59.0)

## 2020-06-11 NOTE — Progress Notes (Signed)
This visit occurred during the SARS-CoV-2 public health emergency.  Safety protocols were in place, including screening questions prior to the visit, additional usage of staff PPE, and extensive cleaning of exam room while observing appropriate contact time as indicated for disinfecting solutions.  ER f/u.  Pain is still present but better. More pain eating/drinking broth but not drinking water.  Had been on ozempic through outside clinic/work.  Off med in the meantime. She isn't on metformin.    Recent CT with IMPRESSION: 1. Small bilateral nonobstructing renal calculi. 2. Otherwise unremarkable exam. Pancreas: Unremarkable. No pancreatic ductal dilatation or surrounding inflammatory changes. Hepatobiliary: No focal liver abnormality is seen. No gallstones, gallbladder wall thickening, or biliary dilatation.  Recent labs d/w pt.    No FCANV.  She has h/o alternating from diarrhea to constipation at baseline.  Still with abd pain from umbilicus up to the epigastric area.  Less pain sitting.  More pain standing and laying flat.  No leg pain. Some back pain.    There were times she had pain prior but she attributed it to a renal stone.  Some dysuria.  No blood in stool.   Oxycodone helps with pain, she can tolerate that.  Routine cautions d/w pt.  She is taking about 1-2 tabs a day.  Last ozempic use was 6 days ago.    Ate chicken last night and that didn't hurt more than cracker and broth.  Her appetite is coming back.    Rare etoh.  She is on topamax and that helped with migraines, rxd per per Ashok Pall.    Meds, vitals, and allergies reviewed.   ROS: Per HPI unless specifically indicated in ROS section   GEN: nad, alert and oriented HEENT: ncat NECK: supple w/o LA CV: rrr PULM: ctab, no inc wob ABD: soft, +bs, slightly tender to palpation in the upper abdomen in the midline, superior to the umbilicus.  No rebound.  Not tender otherwise. EXT: no edema SKIN: no acute rash  At  least 30 minutes were devoted to patient care in this encounter (this can include time spent reviewing the patient's file/history, interviewing and examining the patient, counseling/reviewing plan with patient, ordering referrals, ordering tests, reviewing relevant laboratory or x-ray data, and documenting the encounter).

## 2020-06-11 NOTE — Patient Instructions (Signed)
We'll update neurology.  Call them in the meantime about see about options re: topamax.   Stop ozempic in the meantime.  Go to the lab on the way out.   If you have mychart we'll likely use that to update you.    Rest and fluids, pain meds as needed. Update me about your symptoms in 1-2 days.   Take care.  Glad to see you.

## 2020-06-12 LAB — URINE CULTURE
MICRO NUMBER:: 11280944
Result:: NO GROWTH
SPECIMEN QUALITY:: ADEQUATE

## 2020-06-13 DIAGNOSIS — K859 Acute pancreatitis without necrosis or infection, unspecified: Secondary | ICD-10-CM | POA: Insufficient documentation

## 2020-06-13 NOTE — Assessment & Plan Note (Signed)
With lipase elevation noted but pancreas was unremarkable on the CT scan.  Discussed options. Would hold Ozempic in the meantime.  I need input from neurology about Topamax.  Both could theoretically contribute. We'll update neurology.  I asked her to call them in the meantime about see about options re: topamax.  She agreed See notes on labs. Rest and fluids, advised to use pain meds as needed.  I want her to update me about her symptoms in 1-2 days.  She agrees with plan.  Okay for outpatient follow-up.

## 2020-06-14 ENCOUNTER — Other Ambulatory Visit: Payer: Self-pay | Admitting: Family Medicine

## 2020-06-14 ENCOUNTER — Encounter: Payer: Self-pay | Admitting: Family Medicine

## 2020-06-28 ENCOUNTER — Other Ambulatory Visit: Payer: Self-pay | Admitting: Family Medicine

## 2020-06-28 NOTE — Telephone Encounter (Signed)
Pharmacy requests refill on: Tizanidine 4 mg   LAST REFILL: 06/14/2020 (Q-60, R-1) LAST OV: 06/11/2020 NEXT OV: Not Scheduled  PHARMACY: [place], [place]

## 2020-07-05 ENCOUNTER — Other Ambulatory Visit: Payer: Self-pay | Admitting: Family Medicine

## 2020-07-05 NOTE — Telephone Encounter (Signed)
Called and got update on patient. She stated that she is doing much better. She only has minimal abdominal pain occasionally after eating, but overall she feels better.

## 2020-07-05 NOTE — Telephone Encounter (Signed)
Please Advise

## 2020-07-05 NOTE — Telephone Encounter (Signed)
Sent.  Please get update on patient.  What is her status with topamax use and with her abdominal pain/symptoms?  If still with discomfort, then let me know.  Thanks.

## 2020-07-08 NOTE — Telephone Encounter (Signed)
Noted. Thanks.

## 2020-07-13 ENCOUNTER — Other Ambulatory Visit: Payer: Self-pay | Admitting: Family Medicine

## 2020-07-13 NOTE — Telephone Encounter (Signed)
Pharmacy requests refill on: Tizanidine HCL 4 mg   LAST REFILL: 06/14/2020 (Q-60, R-1) LAST OV: 06/11/2020 NEXT OV: Not Scheduled  PHARMACY: [place], [place]

## 2020-07-16 NOTE — Telephone Encounter (Signed)
Please check with patient.  I think this is all an automatic refill and I doubt she needs the refill right now.

## 2020-08-05 ENCOUNTER — Other Ambulatory Visit: Payer: Self-pay | Admitting: Family Medicine

## 2020-08-06 NOTE — Telephone Encounter (Signed)
Refill request for zanaflex 4 mg tabs   LOV - 06/11/20 Next OV - not scheduled Last refilled - 06/14/20 #60/1

## 2020-08-07 NOTE — Telephone Encounter (Signed)
Sent. Thanks.   

## 2020-08-14 ENCOUNTER — Other Ambulatory Visit: Payer: Self-pay | Admitting: Family Medicine

## 2020-08-14 NOTE — Telephone Encounter (Signed)
Pharmacy requests refill on: Tizanidine 4 mg    LAST REFILL: 08/07/2020 (Q-60, R-1) LAST OV: 06/11/2020 NEXT OV: Not Scheduled  PHARMACY: [place], [place]   Earliest Fill Date: 09/04/2020

## 2020-08-15 ENCOUNTER — Other Ambulatory Visit: Payer: Self-pay | Admitting: Family Medicine

## 2020-08-15 MED ORDER — METHYLPHENIDATE HCL 10 MG PO TABS: 15.0000 mg | ORAL_TABLET | Freq: Two times a day (BID) | ORAL | 0 refills | Status: DC

## 2020-08-15 NOTE — Telephone Encounter (Signed)
Refill request for Ritalin 10 mg tabs  LOV - 06/11/20 Next OV - not scheduled Last refilled - 06/05/20 #120/0

## 2020-08-15 NOTE — Telephone Encounter (Signed)
Sent. Thanks.   

## 2020-09-05 ENCOUNTER — Other Ambulatory Visit: Payer: Self-pay | Admitting: Family Medicine

## 2020-10-03 ENCOUNTER — Encounter (HOSPITAL_COMMUNITY): Payer: Self-pay | Admitting: Emergency Medicine

## 2020-10-03 ENCOUNTER — Emergency Department (HOSPITAL_COMMUNITY)
Admission: EM | Admit: 2020-10-03 | Discharge: 2020-10-03 | Disposition: A | Discharge status: Home / Self Care | Payer: 59 | Attending: Emergency Medicine | Admitting: Emergency Medicine

## 2020-10-03 ENCOUNTER — Emergency Department (HOSPITAL_COMMUNITY): Payer: 59

## 2020-10-03 DIAGNOSIS — R1030 Lower abdominal pain, unspecified: Secondary | ICD-10-CM | POA: Insufficient documentation

## 2020-10-03 DIAGNOSIS — R109 Unspecified abdominal pain: Secondary | ICD-10-CM

## 2020-10-03 DIAGNOSIS — R350 Frequency of micturition: Secondary | ICD-10-CM | POA: Insufficient documentation

## 2020-10-03 DIAGNOSIS — Z87442 Personal history of urinary calculi: Secondary | ICD-10-CM | POA: Diagnosis not present

## 2020-10-03 DIAGNOSIS — Z91040 Latex allergy status: Secondary | ICD-10-CM | POA: Insufficient documentation

## 2020-10-03 DIAGNOSIS — Z87891 Personal history of nicotine dependence: Secondary | ICD-10-CM | POA: Diagnosis not present

## 2020-10-03 DIAGNOSIS — N12 Tubulo-interstitial nephritis, not specified as acute or chronic: Secondary | ICD-10-CM

## 2020-10-03 DIAGNOSIS — R3915 Urgency of urination: Secondary | ICD-10-CM | POA: Insufficient documentation

## 2020-10-03 LAB — HEPATIC FUNCTION PANEL
ALT: 27 U/L (ref 0–44)
AST: 22 U/L (ref 15–41)
Albumin: 4.1 g/dL (ref 3.5–5.0)
Alkaline Phosphatase: 85 U/L (ref 38–126)
Bilirubin, Direct: <0.1 mg/dL (ref 0.0–0.2)
Total Bilirubin: 0.7 mg/dL (ref 0.3–1.2)
Total Protein: 7.2 g/dL (ref 6.5–8.1)

## 2020-10-03 LAB — URINALYSIS, ROUTINE W REFLEX MICROSCOPIC
Bacteria, UA: NONE SEEN
Bilirubin Urine: NEGATIVE
Glucose, UA: NEGATIVE mg/dL
Ketones, ur: NEGATIVE mg/dL
Nitrite: POSITIVE — AB
Protein, ur: NEGATIVE mg/dL
Specific Gravity, Urine: 1.008 (ref 1.005–1.030)
pH: 6 (ref 5.0–8.0)

## 2020-10-03 LAB — BASIC METABOLIC PANEL
Anion gap: 7 (ref 5–15)
BUN: 12 mg/dL (ref 6–20)
CO2: 26 mmol/L (ref 22–32)
Calcium: 9.3 mg/dL (ref 8.9–10.3)
Chloride: 105 mmol/L (ref 98–111)
Creatinine, Ser: 1.01 mg/dL — ABNORMAL HIGH (ref 0.44–1.00)
GFR, Estimated: >60 mL/min (ref >60)
Glucose, Bld: 95 mg/dL (ref 70–99)
Potassium: 3.7 mmol/L (ref 3.5–5.1)
Sodium: 138 mmol/L (ref 135–145)

## 2020-10-03 LAB — CBC
HCT: 42.4 % (ref 36.0–46.0)
Hemoglobin: 13.9 g/dL (ref 12.0–15.0)
MCH: 28.7 pg (ref 26.0–34.0)
MCHC: 32.8 g/dL (ref 30.0–36.0)
MCV: 87.6 fL (ref 80.0–100.0)
Platelets: 413 10*3/uL — ABNORMAL HIGH (ref 150–400)
RBC: 4.84 MIL/uL (ref 3.87–5.11)
RDW: 12.8 % (ref 11.5–15.5)
WBC: 12.7 10*3/uL — ABNORMAL HIGH (ref 4.0–10.5)
nRBC: 0 % (ref 0.0–0.2)

## 2020-10-03 LAB — LIPASE, BLOOD: Lipase: 77 U/L — ABNORMAL HIGH (ref 11–51)

## 2020-10-03 MED ORDER — KETOROLAC TROMETHAMINE 15 MG/ML IJ SOLN
15.0000 mg | Freq: Once | INTRAMUSCULAR | Status: AC
  Administered 2020-10-03: 15 mg via INTRAVENOUS
  Filled 2020-10-03: qty 1

## 2020-10-03 MED ORDER — SODIUM CHLORIDE 0.9 % IV SOLN: Freq: Once | INTRAVENOUS | Status: AC

## 2020-10-03 MED ORDER — ONDANSETRON HCL 4 MG/2ML IJ SOLN
4.0000 mg | Freq: Once | INTRAMUSCULAR | Status: AC
  Administered 2020-10-03: 4 mg via INTRAVENOUS
  Filled 2020-10-03: qty 2

## 2020-10-03 MED ORDER — OXYCODONE-ACETAMINOPHEN 5-325 MG PO TABS
1.0000 | ORAL_TABLET | ORAL | 0 refills | Status: AC | PRN
Start: 2020-10-03 — End: 2020-10-06

## 2020-10-03 MED ORDER — MORPHINE SULFATE (PF) 4 MG/ML IV SOLN
4.0000 mg | Freq: Once | INTRAVENOUS | Status: AC
  Administered 2020-10-03: 4 mg via INTRAVENOUS
  Filled 2020-10-03: qty 1

## 2020-10-03 MED ORDER — TAMSULOSIN HCL 0.4 MG PO CAPS: 0.4000 mg | ORAL_CAPSULE | Freq: Every day | ORAL | 0 refills | Status: AC

## 2020-10-03 MED ORDER — SULFAMETHOXAZOLE-TRIMETHOPRIM 800-160 MG PO TABS: 1.0000 | ORAL_TABLET | Freq: Two times a day (BID) | ORAL | 0 refills | Status: AC

## 2020-10-03 NOTE — ED Provider Notes (Signed)
[facility] DEPT Provider Note   CSN: [id number] Arrival date & time: 10/03/20  1512     History Chief Complaint  Patient presents with  . Flank Pain    Minnetta Sandora is a 40 y.o. female with a past medical history of kidney stones, pancreatitis, who presents today for evaluation of left-sided flank pain. She states that a week ago she started having lower abdominal discomfort frequency/urgency.  She started taking Macrobid on Saturday.  She states that she frequently gets kidney stones and this pain is in the same location however feels slightly different.  Her pain waxes and wanes.  She denies upper abdominal pain.    No abnormal discharge.  She denies concern for STI.  She is status post hysterectomy.  HPI     Past Medical History:  Diagnosis Date  . ADD (attention deficit disorder)    testing done 2016  . ALLERGIC RHINITIS 10/11/2009  . Allergy to latex 04/11/2010  . ANKLE PAIN, RIGHT 04/03/2009  . BACK PAIN 03/19/2009  . COMMON MIGRAINE 03/19/2009  . FLANK PAIN, RIGHT 05/11/2009  . GLUCOSE INTOLERANCE 03/19/2009  . HYDRONEPHROSIS, RIGHT 05/11/2009  . INSOMNIA-SLEEP DISORDER-UNSPEC 03/19/2009  . Left tibial fracture 2018  . Migraine   . NEPHROLITHIASIS, HX OF 03/19/2009  . RENAL CALCULUS, RIGHT 05/11/2009    Patient Active Problem List   Diagnosis Date Noted  . Pancreatitis 06/13/2020  . Snoring 10/16/2019  . Chronic migraine 12/22/2017  . Advance care planning 10/02/2017  . BRBPR (bright red blood per rectum) 09/30/2016  . Encounter for general adult medical examination with abnormal findings 08/30/2015  . Pain in joint, shoulder region 08/30/2015  . ADD (attention deficit disorder) 06/20/2014  . Obesity (BMI 30-39.9) 08/15/2013  . Stress incontinence 08/15/2013  . Anxiety state, unspecified 08/15/2013  . Chronic back pain 03/23/2013  . Nephrolithiasis 12/31/2012  . LATEX ALLERGY 04/11/2010  . ALLERGIC RHINITIS 10/11/2009  .  Migraine with aura 03/19/2009  . Insomnia 03/19/2009    Past Surgical History:  Procedure Laterality Date  . ABDOMINAL HYSTERECTOMY    . APPENDECTOMY  1994  . CYSTOSCOPY W/ URETERAL STENT PLACEMENT Left 09/24/2014   Procedure: CYSTOSCOPY WITH RETROGRADE PYELOGRAM/LEFT URETERAL STENT PLACEMENT;  Surgeon: Raynelle Bring, MD;  Location: [facility];  Service: Urology;  Laterality: Left;  . EYE SURGERY Bilateral    lens replacement 2014  . hx of etopic pregnancy  2005  . LITHOTRIPSY  2011  . OOPHORECTOMY  07/2008  . s/p left knee arthroscopic  1994     OB History   No obstetric history on file.     Family History  Problem Relation Age of Onset  . Alcohol abuse Mother        ETOH  . Bipolar disorder Mother        manic depressive/bipolar  . Thyroid disease Mother        Low thyroid  . Hypertension Father   . Hypertension Other   . Diabetes Other   . Heart disease Other   . Stroke Other   . Cancer Other        Lung Cancer  . Colon cancer Maternal Grandmother   . Breast cancer Paternal Grandmother     Social History   Tobacco Use  . Smoking status: Former Smoker    Packs/day: 1.00    Years: 17.00    Pack years: 17.00    Types: Cigarettes    Quit date: 2017    Years since  quitting: 5.2  . Smokeless tobacco: Never Used  Vaping Use  . Vaping Use: Never used  Substance Use Topics  . Alcohol use: Yes    Alcohol/week: 0.0 standard drinks    Comment: rare  . Drug use: No    Home Medications Prior to Admission medications   Medication Sig Start Date End Date Taking? Authorizing Provider  benzonatate (TESSALON) 200 MG capsule Take 1 capsule (200 mg total) by mouth 3 (three) times daily as needed. 11/30/19   Tonia Ghent, MD  calcium-vitamin D (OSCAL WITH D) 500-200 MG-UNIT tablet Take 1 tablet by mouth daily with breakfast.    [provider]  EPINEPHRINE 0.3 mg/0.3 mL IJ SOAJ injection INJECT 0.3 MLS INTO THE MUSCLE ONCE FOR 1 DOSE. 12/14/19   Tonia Ghent,  MD  methylphenidate (RITALIN) 10 MG tablet Take 1.5-2 tablets (15-20 mg total) by mouth 2 (two) times daily. 08/15/20   Tonia Ghent, MD  ondansetron (ZOFRAN-ODT) 4 MG disintegrating tablet Take 1 tablet (4 mg total) by mouth every 8 (eight) hours as needed. 06/07/20   Fisher, Linden Dolin, PA-C  oxyCODONE-acetaminophen (PERCOCET) 5-325 MG tablet Take 1 tablet by mouth every 4 (four) hours as needed for severe pain. 06/07/20 06/07/21  Caryn Section Linden Dolin, PA-C  prochlorperazine (COMPAZINE) 10 MG tablet Take by mouth. 04/20/20 04/21/21  [provider]  tiZANidine (ZANAFLEX) 4 MG tablet TAKE 1 TABLET BY MOUTH EVERY 6 HOURS AS NEEDED 08/07/20   Tonia Ghent, MD  topiramate (TOPAMAX) 25 MG tablet PLEASE SEE ATTACHED FOR DETAILED DIRECTIONS 03/18/20   [provider]  zolpidem (AMBIEN CR) 12.5 MG CR tablet TAKE 1 TABLET BY MOUTH EVERYDAY AT BEDTIME 07/05/20   Tonia Ghent, MD    Allergies    Amoxicillin-pot clavulanate, Latex, Aspirin, Eggs or egg-derived products, Ibuprofen, Nsaids, Other, Ozempic (0.25 or 0.5 mg-dose) [semaglutide(0.25 or 0.5mg -dos)], and Red dye  Review of Systems   Review of Systems  Constitutional: Negative for chills and fever.  Respiratory: Negative for shortness of breath.   Cardiovascular: Negative for chest pain.  Gastrointestinal: Positive for abdominal pain and nausea. Negative for diarrhea and vomiting.  Genitourinary: Positive for frequency and urgency. Negative for dysuria and vaginal discharge.  Skin: Negative for color change and rash.  Neurological: Negative for weakness and headaches.  All other systems reviewed and are negative.   Physical Exam Updated Vital Signs BP (!) 138/91 (BP Location: Right Arm)   Pulse 93   Temp 97.6 F (36.4 C) (Oral)   Resp 17   Ht 5\' 8"  (1.727 m)   Wt 81.6 kg   SpO2 100%   BMI 27.37 kg/m   Physical Exam Vitals and nursing note reviewed.  Constitutional:      General: She is not in acute distress.     Appearance: She is not diaphoretic.  HENT:     Head: Normocephalic and atraumatic.  Eyes:     General: No scleral icterus.       Right eye: No discharge.        Left eye: No discharge.     Conjunctiva/sclera: Conjunctivae normal.  Cardiovascular:     Rate and Rhythm: Normal rate and regular rhythm.     Pulses: Normal pulses.  Pulmonary:     Effort: Pulmonary effort is normal. No respiratory distress.     Breath sounds: No stridor.  Abdominal:     General: There is no distension.     Tenderness: There is no abdominal  tenderness. There is left CVA tenderness. There is no right CVA tenderness or guarding.  Musculoskeletal:        General: No deformity.     Cervical back: Normal range of motion.  Skin:    General: Skin is warm and dry.  Neurological:     General: No focal deficit present.     Mental Status: She is alert.     Cranial Nerves: No cranial nerve deficit.     Motor: No abnormal muscle tone.  Psychiatric:        Behavior: Behavior normal.     ED Results / Procedures / Treatments   Labs (all labs ordered are listed, but only abnormal results are displayed) Labs Reviewed  URINALYSIS, ROUTINE W REFLEX MICROSCOPIC - Abnormal; Notable for the following components:      Result Value   Color, Urine ORANGE (*)    Hgb urine dipstick SMALL (*)    Nitrite POSITIVE (*)    Leukocytes,Ua SMALL (*)    All other components within normal limits  BASIC METABOLIC PANEL  CBC  LIPASE, BLOOD  HEPATIC FUNCTION PANEL    EKG None  Radiology No results found.  Procedures Procedures   Medications Ordered in ED Medications  ondansetron (ZOFRAN) injection 4 mg (4 mg Intravenous Given 10/03/20 1729)  morphine 4 MG/ML injection 4 mg (4 mg Intravenous Given 10/03/20 1729)  0.9 %  sodium chloride infusion ( Intravenous New Bag/Given 10/03/20 1730)    ED Course  I have reviewed the triage vital signs and the nursing notes.  Pertinent labs & imaging results that were available  during my care of the patient were reviewed by me and considered in my medical decision making (see chart for details).    MDM Rules/Calculators/A&P                         Patient is a 40 year old woman who presents today for evaluation of left-sided flank pain.  She has been being treated with Macrobid and Pyridium for UTI.  Her symptoms started a week ago today and she started on antibiotics on Saturday. Her pain has been spreading into her left-sided back. She does have left-sided CVA tenderness to percussion.  Here she is afebrile, not tachycardic or tachypneic and 100% on room air. Pain medication and fluids are ordered.  CT renal study, labs and UA are ordered.    At shift change care was transferred to [facility] who will follow pending studies, re-evaulate and determine disposition.      Final Clinical Impression(s) / ED Diagnoses Final diagnoses:  Left flank pain    Rx / DC Orders ED Discharge Orders    None       Ollen Gross 10/03/20 1745    Carmin Muskrat, MD 10/05/20 [phone]

## 2020-10-03 NOTE — ED Provider Notes (Signed)
  Physical Exam  BP 117/81   Pulse 86   Temp 97.6 F (36.4 C) (Oral)   Resp 16   Ht 5\' 8"  (1.727 m)   Wt 81.6 kg   SpO2 100%   BMI 27.37 kg/m   Physical Exam  ED Course/Procedures   Clinical Course as of 10/03/20 2009  Wed Oct 03, 2020  1758 Hgb urine dipstick(!): SMALL [JS]  1758 Nitrite(!): POSITIVE [JS]  1758 Chalmers Guest): SMALL [JS]    Clinical Course User Index [JS] Janeece Fitting, PA-C    Procedures  MDM  Patient care received from [facility]. PA at shift change, please see her note for full HPI.  Briefly, patient here with week of lower abdominal pain, urinary frequency and urgency.  Started on Macrobid and Pyridium.  Today endorses flank pain, prior history of kidney stones. Plan is for patient pending CT renal to evaluate for infected stone.  She has received morphine along with Zofran for symptomatic control.  CT Renal Stone showed:  1. Mild to moderate right hydroureteronephrosis to the level of the  right ureterovesicular junction where there are 2 small calculi  positioned at the ureteral orifice, the largest measuring up to 3 mm  in size and the smaller focus measuring up to 2 mm in size more  proximally. Please note, this is contralateral to the indicated area  of concern on order requisition. Please verify laterality of  patient's symptoms.  2. Few nonobstructing calculi in the upper and lower pole left  kidney without evidence of a left urinary tract obstruction.     7:03 PM results of the CT imaging was discussed with patient at length.  She is aware that stones are present on the right kidney.  She reports most of her pain is located to the left.  She was started on Macrobid on Saturday, only has 1 dose left of the course.  We discussed treatment for likely pyelonephritis at this time with the UTI along with flank pain.  She remains afebrile.  Creatinine level is slightly increased but she received fluids, will attempt Toradol to help with pain  control.  8:09 PM reevaluated by me, reports resolution symptoms at this time.  We discussed symptomatic treatment with antibiotics, Flomax, pain control.  She is agreeable to discharge home at this time.  Return precautions discussed at length   Portions of this note were generated with Dragon dictation software. Dictation errors may occur despite best attempts at proofreading.        Janeece Fitting, PA-C 10/03/20 2009    Carmin Muskrat, MD 10/05/20 [phone]

## 2020-10-03 NOTE — Discharge Instructions (Addendum)
I have prescribed antibiotics to help treat your pyelonephritis.  Please take 1 tablet twice a day for the next 7 days.  I have also provided medication to help with severe pain, please take this only for severe pain.  You were also given a prescription for Flomax, please take this daily until stone passage.

## 2020-10-03 NOTE — ED Triage Notes (Signed)
Per pt, states she has been on Macrobid and pyridium since last week for a UTI-states she has been having increased left flank pain since middle of last week-states she had a history of kidney stones-

## 2020-10-13 ENCOUNTER — Other Ambulatory Visit: Payer: Self-pay | Admitting: Family Medicine

## 2020-10-15 ENCOUNTER — Other Ambulatory Visit: Payer: Self-pay | Admitting: Family Medicine

## 2020-10-15 NOTE — Telephone Encounter (Signed)
Refill request for Ambien 12.5 mg tablets  LOV - 06/11/20 Next OV - not scheduled Last refill - 07/05/20 #30/2

## 2020-10-16 NOTE — Telephone Encounter (Signed)
Refill request for Ritalin 10 mg tablets  LOV - 06/11/20 Next OV - not scheduled Last refill - 08/15/20 #120/0

## 2020-10-16 NOTE — Telephone Encounter (Signed)
Sent. Thanks.   

## 2020-10-17 MED ORDER — METHYLPHENIDATE HCL 10 MG PO TABS: 15.0000 mg | ORAL_TABLET | Freq: Two times a day (BID) | ORAL | 0 refills | Status: DC

## 2020-10-17 NOTE — Telephone Encounter (Signed)
Sent. Thanks.   

## 2020-10-22 ENCOUNTER — Other Ambulatory Visit: Payer: Self-pay | Admitting: Family Medicine

## 2020-10-22 NOTE — Telephone Encounter (Signed)
Sent. Thanks.   

## 2020-10-22 NOTE — Telephone Encounter (Signed)
Last office visit 06/11/20 for abd pain.  Last refilled 08/07/2020 for #60 with 1 refill.  No future appointments with PCP.

## 2020-10-23 ENCOUNTER — Telehealth: Payer: Self-pay | Admitting: Family Medicine

## 2020-10-23 NOTE — Telephone Encounter (Signed)
Patient called and states that her ADD med required PA Methylphenidate 10mg   Please advise, thanks.

## 2020-10-23 NOTE — Telephone Encounter (Signed)
Suriah Slates Key: [id number] - PA Case ID: [id number] - Rx #: [id number] Need help? Call us at [phone] Status Sent to [facility] Methylphenidate HCl 10MG  tablets Form [employer] Electronic PA Form [phone] NCPDP) Original Claim Info [facility] REQ-MD CALL [phone].DRUG REQUIRES PRIOR AUTHORIZATION(PHARMACY HELP DESK [phone])

## 2020-10-24 NOTE — Telephone Encounter (Signed)
PA approved from 10/23/20 - 10/23/2023

## 2020-10-25 ENCOUNTER — Ambulatory Visit: Payer: 59 | Admitting: Dermatology

## 2020-10-25 ENCOUNTER — Other Ambulatory Visit: Payer: Self-pay

## 2020-10-25 DIAGNOSIS — D229 Melanocytic nevi, unspecified: Secondary | ICD-10-CM

## 2020-10-25 DIAGNOSIS — D1800 Hemangioma unspecified site: Secondary | ICD-10-CM

## 2020-10-25 DIAGNOSIS — L814 Other melanin hyperpigmentation: Secondary | ICD-10-CM

## 2020-10-25 DIAGNOSIS — Z1283 Encounter for screening for malignant neoplasm of skin: Secondary | ICD-10-CM | POA: Diagnosis not present

## 2020-10-25 DIAGNOSIS — L578 Other skin changes due to chronic exposure to nonionizing radiation: Secondary | ICD-10-CM

## 2020-10-25 DIAGNOSIS — L821 Other seborrheic keratosis: Secondary | ICD-10-CM | POA: Diagnosis not present

## 2020-10-25 DIAGNOSIS — B079 Viral wart, unspecified: Secondary | ICD-10-CM

## 2020-10-25 DIAGNOSIS — Q825 Congenital non-neoplastic nevus: Secondary | ICD-10-CM

## 2020-10-25 DIAGNOSIS — I781 Nevus, non-neoplastic: Secondary | ICD-10-CM

## 2020-10-25 NOTE — Progress Notes (Signed)
New Patient Visit  Subjective  [name] is a 40 y.o. female who presents for the following: check spot (L arm, ~ 73yr, no symptoms/), Total body skin exam (New mole scalp), and Warts (R 5th finger, ~80m). The patient presents for Total-Body Skin Exam (TBSE) for skin cancer screening and mole check.  New patient referral from Dr. Elsie Stain.  The following portions of the chart were reviewed this encounter and updated as appropriate:   Tobacco  Allergies  Meds  Problems  Med Hx  Surg Hx  Fam Hx     Review of Systems:  No other skin or systemic complaints except as noted in HPI or Assessment and Plan.  Objective  Well appearing patient in no apparent distress; mood and affect are within normal limits.  A full examination was performed including scalp, head, eyes, ears, nose, lips, neck, chest, axillae, abdomen, back, buttocks, bilateral upper extremities, bilateral lower extremities, hands, feet, fingers, toes, fingernails, and toenails. All findings within normal limits unless otherwise noted below.  Objective  R inf scapula: Brown macule  Objective  Left upper arm: Dilated blood vessel   Objective  Right 5th finger x 1: Verrucous papules -- Discussed viral etiology and contagion.    Assessment & Plan    Lentigines - Scattered tan macules - Due to sun exposure - Benign-appering, observe - Recommend daily broad spectrum sunscreen SPF 30+ to sun-exposed areas, reapply every 2 hours as needed. - Call for any changes  Seborrheic Keratoses - Stuck-on, waxy, tan-brown papules and/or plaques  - Benign-appearing - Discussed benign etiology and prognosis. - Observe - Call for any changes  Melanocytic Nevi - Tan-brown and/or pink-flesh-colored symmetric macules and papules - Benign appearing on exam today - Observation - Call clinic for new or changing moles - Recommend daily use of broad spectrum spf 30+ sunscreen to sun-exposed areas.    Hemangiomas - Red papules - Discussed benign nature - Observe - Call for any changes  Actinic Damage - Chronic condition, secondary to cumulative UV/sun exposure - diffuse scaly erythematous macules with underlying dyspigmentation - Recommend daily broad spectrum sunscreen SPF 30+ to sun-exposed areas, reapply every 2 hours as needed.  - Staying in the shade or wearing long sleeves, sun glasses (UVA+UVB protection) and wide brim hats (4-inch brim around the entire circumference of the hat) are also recommended for sun protection.  - Call for new or changing lesions.  Skin cancer screening performed today.  Congenital non-neoplastic nevus R inf scapula Benign-appearing.  Observation.  Call clinic for new or changing moles.  Recommend daily use of broad spectrum spf 30+ sunscreen to sun-exposed areas.    Telangiectasia Left upper arm Benign, observe Discussed BBL treatment  Viral warts, unspecified type Right 5th finger x 1 Destruction of lesion - Right 5th finger x 1 Complexity: simple   Destruction method: cryotherapy   Informed consent: discussed and consent obtained   Timeout:  patient name, date of birth, surgical site, and procedure verified Lesion destroyed using liquid nitrogen: Yes   Region frozen until ice ball extended beyond lesion: Yes   Outcome: patient tolerated procedure well with no complications   Post-procedure details: wound care instructions given    Return in about 6 weeks (around 12/06/2020) for wart f/u.  I, Othelia Pulling, RMA, am acting as scribe for Sarina Ser, MD .  Documentation: I have reviewed the above documentation for accuracy and completeness, and I agree with the above.  Sarina Ser, MD

## 2020-10-25 NOTE — Patient Instructions (Addendum)
If you have any questions or concerns for your doctor, please call our main line at [phone] and press option 4 to reach your doctor's medical assistant. If no one answers, please leave a voicemail as directed and we will return your call as soon as possible. Messages left after 4 pm will be answered the following business day.   You may also send [place] a message via [facility]. We typically respond to MyChart messages within 1-2 business days.  For prescription refills, please ask your pharmacy to contact our office. Our fax number is [phone].  If you have an urgent issue when the clinic is closed that cannot wait until the next business day, you can page your doctor at the number below.    Please note that while we do our best to be available for urgent issues outside of office hours, we are not available 24/7.   If you have an urgent issue and are unable to reach [place], you may choose to seek medical care at your doctor's office, retail clinic, urgent care center, or emergency room.  If you have a medical emergency, please immediately call 911 or go to the emergency department.  Pager Numbers  - Dr. Nehemiah Massed: [phone]  - Dr. Laurence Ferrari: [phone]  - Dr. Nicole Kindred: [phone]  In the event of inclement weather, please call our main line at [phone] for an update on the status of any delays or closures.  Dermatology Medication Tips: Please keep the boxes that topical medications come in in order to help keep track of the instructions about where and how to use these. Pharmacies typically print the medication instructions only on the boxes and not directly on the medication tubes.   If your medication is too expensive, please contact our office at [phone] option 4 or send [place] a message through [place].   We are unable to tell what your co-pay for medications will be in advance as this is different depending on your insurance coverage. However, we may be able to find a substitute  medication at lower cost or fill out paperwork to get insurance to cover a needed medication.   If a prior authorization is required to get your medication covered by your insurance company, please allow [place] 1-2 business days to complete this process.  Drug prices often vary depending on where the prescription is filled and some pharmacies may offer cheaper prices.  The website www.goodrx.com contains coupons for medications through different pharmacies. The prices here do not account for what the cost may be with help from insurance (it may be cheaper with your insurance), but the website can give you the price if you did not use any insurance.  - You can print the associated coupon and take it with your prescription to the pharmacy.  - You may also stop by our office during regular business hours and pick up a GoodRx coupon card.  - If you need your prescription sent electronically to a different pharmacy, notify our office through [facility] or by phone at [phone] option 4.    This is a WART caused by the human papilloma virus. It is not dangerous but is contagious and can spread to other areas of skin or other people if it is not completely gone. No additional treatment is needed. However, if it comes back, we can freeze it in clinic with liquid nitrogen (a quick in office procedure) or you can also treat it at home with an over the counter salicylic wart  treatment (slower).  Please call the office at [phone] or message [place] if you have have any questions.

## 2020-10-29 ENCOUNTER — Encounter: Payer: Self-pay | Admitting: Dermatology

## 2020-10-31 ENCOUNTER — Other Ambulatory Visit: Payer: Self-pay | Admitting: Family Medicine

## 2020-11-22 ENCOUNTER — Other Ambulatory Visit: Payer: Self-pay | Admitting: Family Medicine

## 2020-12-03 ENCOUNTER — Other Ambulatory Visit: Payer: Self-pay | Admitting: Family Medicine

## 2020-12-12 ENCOUNTER — Ambulatory Visit: Payer: 59 | Admitting: Dermatology

## 2020-12-25 ENCOUNTER — Other Ambulatory Visit: Payer: Self-pay | Admitting: Family Medicine

## 2020-12-25 NOTE — Telephone Encounter (Signed)
LAST APPOINTMENT DATE: 12/03/2020   NEXT APPOINTMENT DATE: Visit date not found    LAST REFILL:  10/16/2020  QTY: 30 REF 2

## 2021-01-03 ENCOUNTER — Other Ambulatory Visit: Payer: Self-pay | Admitting: Family Medicine

## 2021-01-03 NOTE — Telephone Encounter (Signed)
Refill request for Ritalin 10 mg tablets  LOV - 06/11/20 Next OV - not scheduled Last refill - 10/17/20 #120/0

## 2021-01-04 MED ORDER — METHYLPHENIDATE HCL 10 MG PO TABS: 15.0000 mg | ORAL_TABLET | Freq: Two times a day (BID) | ORAL | 0 refills | Status: DC

## 2021-01-04 NOTE — Telephone Encounter (Signed)
Sent. Thanks.   

## 2021-01-15 ENCOUNTER — Other Ambulatory Visit: Payer: Self-pay | Admitting: Family Medicine

## 2021-01-24 ENCOUNTER — Other Ambulatory Visit: Payer: Self-pay | Admitting: Family Medicine

## 2021-03-04 ENCOUNTER — Other Ambulatory Visit: Payer: Self-pay | Admitting: Family Medicine

## 2021-03-05 NOTE — Telephone Encounter (Signed)
Refill request for Ambien 12.5 mg tablets  LOV - 06/11/20 Next OV - not scheduled Last refill - 12/26/20 #30/2

## 2021-03-06 ENCOUNTER — Other Ambulatory Visit: Payer: Self-pay | Admitting: Family Medicine

## 2021-03-06 MED ORDER — METHYLPHENIDATE HCL 10 MG PO TABS: 15.0000 mg | ORAL_TABLET | Freq: Two times a day (BID) | ORAL | 0 refills | Status: DC

## 2021-03-06 NOTE — Telephone Encounter (Signed)
Sent. Thanks.   

## 2021-03-06 NOTE — Telephone Encounter (Signed)
Refill request for Ritalin 10 mg tablets  LOV - 06/11/20 Next OV - not scheduled Last refill - 01/04/21 #120/0

## 2021-03-14 ENCOUNTER — Other Ambulatory Visit: Payer: Self-pay | Admitting: Family Medicine

## 2021-03-20 NOTE — Telephone Encounter (Signed)
Last refilled on 01/16/21 #60 with 1 refill  LOV 06/11/20 Pancreatitis  No future appointments

## 2021-03-20 NOTE — Telephone Encounter (Signed)
Okay to continue.  Sent.  Thanks. 

## 2021-04-01 ENCOUNTER — Other Ambulatory Visit: Payer: Self-pay | Admitting: Family Medicine

## 2021-04-01 ENCOUNTER — Encounter: Payer: Self-pay | Admitting: Family Medicine

## 2021-04-01 NOTE — Telephone Encounter (Signed)
Called patient let know we are starting Prior auth:

## 2021-05-03 ENCOUNTER — Other Ambulatory Visit: Payer: Self-pay | Admitting: Family Medicine

## 2021-05-03 MED ORDER — METHYLPHENIDATE HCL 10 MG PO TABS: 15.0000 mg | ORAL_TABLET | Freq: Two times a day (BID) | ORAL | 0 refills | Status: DC

## 2021-05-03 NOTE — Telephone Encounter (Signed)
Sent. Thanks.   

## 2021-05-03 NOTE — Telephone Encounter (Signed)
Refill request for Ritalin 10 mg tablets  LOV - 06/11/20 Next OV - not scheduled Last refill - 03/06/21 #120/0

## 2021-06-09 ENCOUNTER — Telehealth: Payer: Self-pay | Admitting: Family Medicine

## 2021-06-10 ENCOUNTER — Encounter: Payer: Self-pay | Admitting: Family Medicine

## 2021-06-10 ENCOUNTER — Other Ambulatory Visit: Payer: Self-pay | Admitting: Family Medicine

## 2021-06-10 NOTE — Telephone Encounter (Signed)
Please call and schedule patient CPE

## 2021-06-10 NOTE — Telephone Encounter (Signed)
Called and got her scheduled for 1/24 for labs and 1/31 for cpe

## 2021-06-11 NOTE — Telephone Encounter (Signed)
Refill request for zolpidem (AMBIEN CR) 12.5 MG CR tablet  LOV - 06/11/20 Next OV - 08/06/21 Last refill - 03/06/21 #30/2

## 2021-06-12 NOTE — Telephone Encounter (Signed)
Sent. Thanks.   

## 2021-06-21 ENCOUNTER — Other Ambulatory Visit: Payer: Self-pay | Admitting: Family Medicine

## 2021-06-24 NOTE — Telephone Encounter (Signed)
Refill request for zanaflex 4 mg tablets  LOV - 06/11/20 Next OV - 08/06/21 Last refill - 06/10/21 #60/0

## 2021-06-25 NOTE — Telephone Encounter (Signed)
Sent. Thanks.   

## 2021-07-03 ENCOUNTER — Other Ambulatory Visit: Payer: Self-pay

## 2021-07-03 ENCOUNTER — Emergency Department
Admission: EM | Admit: 2021-07-03 | Discharge: 2021-07-03 | Disposition: A | Discharge status: Home / Self Care | Payer: 59 | Attending: Emergency Medicine | Admitting: Emergency Medicine

## 2021-07-03 ENCOUNTER — Emergency Department: Payer: 59

## 2021-07-03 ENCOUNTER — Encounter: Payer: Self-pay | Admitting: Emergency Medicine

## 2021-07-03 DIAGNOSIS — Z87891 Personal history of nicotine dependence: Secondary | ICD-10-CM | POA: Diagnosis not present

## 2021-07-03 DIAGNOSIS — Z79899 Other long term (current) drug therapy: Secondary | ICD-10-CM | POA: Diagnosis not present

## 2021-07-03 DIAGNOSIS — Z91040 Latex allergy status: Secondary | ICD-10-CM | POA: Diagnosis not present

## 2021-07-03 DIAGNOSIS — R1013 Epigastric pain: Secondary | ICD-10-CM | POA: Diagnosis present

## 2021-07-03 DIAGNOSIS — K5289 Other specified noninfective gastroenteritis and colitis: Secondary | ICD-10-CM | POA: Diagnosis not present

## 2021-07-03 DIAGNOSIS — K529 Noninfective gastroenteritis and colitis, unspecified: Secondary | ICD-10-CM

## 2021-07-03 DIAGNOSIS — R197 Diarrhea, unspecified: Secondary | ICD-10-CM

## 2021-07-03 DIAGNOSIS — R1084 Generalized abdominal pain: Secondary | ICD-10-CM

## 2021-07-03 LAB — URINALYSIS, ROUTINE W REFLEX MICROSCOPIC
Bacteria, UA: NONE SEEN
Bilirubin Urine: NEGATIVE
Glucose, UA: NEGATIVE mg/dL
Ketones, ur: 5 mg/dL — AB
Leukocytes,Ua: NEGATIVE
Nitrite: NEGATIVE
Protein, ur: NEGATIVE mg/dL
Specific Gravity, Urine: 1.016 (ref 1.005–1.030)
pH: 5 (ref 5.0–8.0)

## 2021-07-03 LAB — BASIC METABOLIC PANEL
Anion gap: 8 (ref 5–15)
BUN: 14 mg/dL (ref 6–20)
CO2: 23 mmol/L (ref 22–32)
Calcium: 9.7 mg/dL (ref 8.9–10.3)
Chloride: 104 mmol/L (ref 98–111)
Creatinine, Ser: 1 mg/dL (ref 0.44–1.00)
GFR, Estimated: >60 mL/min (ref >60)
Glucose, Bld: 108 mg/dL — ABNORMAL HIGH (ref 70–99)
Potassium: 4.2 mmol/L (ref 3.5–5.1)
Sodium: 135 mmol/L (ref 135–145)

## 2021-07-03 LAB — CBC
HCT: 47.1 % — ABNORMAL HIGH (ref 36.0–46.0)
Hemoglobin: 15.8 g/dL — ABNORMAL HIGH (ref 12.0–15.0)
MCH: 28.8 pg (ref 26.0–34.0)
MCHC: 33.5 g/dL (ref 30.0–36.0)
MCV: 85.9 fL (ref 80.0–100.0)
Platelets: 393 10*3/uL (ref 150–400)
RBC: 5.48 MIL/uL — ABNORMAL HIGH (ref 3.87–5.11)
RDW: 12.1 % (ref 11.5–15.5)
WBC: 23.9 10*3/uL — ABNORMAL HIGH (ref 4.0–10.5)
nRBC: 0 % (ref 0.0–0.2)

## 2021-07-03 LAB — C DIFFICILE QUICK SCREEN W PCR REFLEX
C Diff antigen: NEGATIVE
C Diff interpretation: NOT DETECTED
C Diff toxin: NEGATIVE

## 2021-07-03 LAB — POC URINE PREG, ED: Preg Test, Ur: NEGATIVE

## 2021-07-03 MED ORDER — MORPHINE SULFATE (PF) 4 MG/ML IV SOLN
4.0000 mg | Freq: Once | INTRAVENOUS | Status: AC
  Administered 2021-07-03: 20:00:00 4 mg via INTRAVENOUS
  Filled 2021-07-03: qty 1

## 2021-07-03 MED ORDER — METRONIDAZOLE 500 MG PO TABS
500.0000 mg | ORAL_TABLET | Freq: Once | ORAL | Status: AC
  Administered 2021-07-03: 22:00:00 500 mg via ORAL
  Filled 2021-07-03: qty 1

## 2021-07-03 MED ORDER — IOHEXOL 300 MG/ML  SOLN
100.0000 mL | Freq: Once | INTRAMUSCULAR | Status: AC | PRN
  Administered 2021-07-03: 15:00:00 100 mL via INTRAVENOUS

## 2021-07-03 MED ORDER — METRONIDAZOLE 500 MG PO TABS: 500.0000 mg | ORAL_TABLET | Freq: Three times a day (TID) | ORAL | 0 refills | Status: AC

## 2021-07-03 MED ORDER — ONDANSETRON HCL 4 MG/2ML IJ SOLN: 4.0000 mg | Freq: Four times a day (QID) | INTRAMUSCULAR | Status: DC | PRN

## 2021-07-03 MED ORDER — HYDROCODONE-ACETAMINOPHEN 5-325 MG PO TABS
1.0000 | ORAL_TABLET | ORAL | 0 refills | Status: DC | PRN
Start: 2021-07-03 — End: 2021-09-03

## 2021-07-03 MED ORDER — CIPROFLOXACIN HCL 500 MG PO TABS: 500.0000 mg | ORAL_TABLET | Freq: Two times a day (BID) | ORAL | 0 refills | Status: AC

## 2021-07-03 MED ORDER — CIPROFLOXACIN HCL 500 MG PO TABS
500.0000 mg | ORAL_TABLET | Freq: Once | ORAL | Status: AC
  Administered 2021-07-03: 22:00:00 500 mg via ORAL
  Filled 2021-07-03: qty 1

## 2021-07-03 MED ORDER — MORPHINE SULFATE (PF) 4 MG/ML IV SOLN
4.0000 mg | Freq: Once | INTRAVENOUS | Status: AC
  Administered 2021-07-03: 22:00:00 4 mg via INTRAVENOUS
  Filled 2021-07-03: qty 1

## 2021-07-03 NOTE — ED Triage Notes (Signed)
Presents with left flank pain  feels like possible renal stone

## 2021-07-03 NOTE — ED Provider Notes (Signed)
°  Emergency Medicine Provider Triage Evaluation Note  [name] , a 40 y.o.female,  was evaluated in triage.  Pt complains of abdominal pain.  This started approximately 48 hours ago.  Endorses epigastric pain, back pain, and left/right flank pain.  Denies fever/chills, chest pain, shortness of breath, or urinary symptoms.   Review of Systems  Positive: Abdominal/flank pain Negative: Denies fever, chest pain, vomiting  Physical Exam   Vitals:   07/03/21 1402  BP: 140/86  Pulse: 90  Resp: 18  SpO2: 98%   Gen:   Awake, no distress   Resp:  Normal effort  MSK:   Moves extremities without difficulty  Other:    Medical Decision Making  Given the patient's initial medical screening exam, the following diagnostic evaluation has been ordered. The patient will be placed in the appropriate treatment space, once one is available, to complete the evaluation and treatment. I have discussed the plan of care with the patient and I have advised the patient that an ED physician or mid-level practitioner will reevaluate their condition after the test results have been received, as the results may give them additional insight into the type of treatment they may need.    Diagnostics: Labs, abdominal CT  Treatments: none immediately   Teodoro Spray, [place] 07/03/21 1408    Vladimir Crofts, MD 07/03/21 1726

## 2021-07-03 NOTE — ED Provider Notes (Signed)
[facility] Emergency Department Provider Note   ____________________________________________   Event Date/Time   First MD Initiated Contact with Patient 07/03/21 1700     (approximate)  I have reviewed the triage vital signs and the nursing notes.  HISTORY  Chief Complaint Flank Pain  HPI [name] is a 40 y.o. female who presents for left-sided abdominal pain  LOCATION: Left-sided abdomen DURATION: 48 hours prior to arrival TIMING: Worsening since onset SEVERITY: Moderate QUALITY: Aching pain CONTEXT: Patient states that she has had epigastric pain, left-sided back pain, and left flank pain is and worsening over the last 48 hours is associated with diarrhea MODIFYING FACTORS: Denies any exacerbating or relieving factors ASSOCIATED SYMPTOMS: Watery diarrhea   Per medical record review, patient has history of C. difficile          Past Medical History:  Diagnosis Date   ADD (attention deficit disorder)    testing done 2016   ALLERGIC RHINITIS 10/11/2009   Allergy to latex 04/11/2010   ANKLE PAIN, RIGHT 04/03/2009   BACK PAIN 03/19/2009   COMMON MIGRAINE 03/19/2009   FLANK PAIN, RIGHT 05/11/2009   GLUCOSE INTOLERANCE 03/19/2009   HYDRONEPHROSIS, RIGHT 05/11/2009   INSOMNIA-SLEEP DISORDER-UNSPEC 03/19/2009   Left tibial fracture 2018   Migraine    NEPHROLITHIASIS, HX OF 03/19/2009   RENAL CALCULUS, RIGHT 05/11/2009    Patient Active Problem List   Diagnosis Date Noted   Pancreatitis 06/13/2020   Snoring 10/16/2019   Chronic migraine 12/22/2017   Advance care planning 10/02/2017   BRBPR (bright red blood per rectum) 09/30/2016   Encounter for general adult medical examination with abnormal findings 08/30/2015   Pain in joint, shoulder region 08/30/2015   ADD (attention deficit disorder) 06/20/2014   Obesity (BMI 30-39.9) 08/15/2013   Stress incontinence 08/15/2013   Anxiety state, unspecified 08/15/2013   Chronic back pain  03/23/2013   Nephrolithiasis 12/31/2012   LATEX ALLERGY 04/11/2010   ALLERGIC RHINITIS 10/11/2009   Migraine with aura 03/19/2009   Insomnia 03/19/2009    Past Surgical History:  Procedure Laterality Date   ABDOMINAL HYSTERECTOMY     APPENDECTOMY  1994   CYSTOSCOPY W/ URETERAL STENT PLACEMENT Left 09/24/2014   Procedure: CYSTOSCOPY WITH RETROGRADE PYELOGRAM/LEFT URETERAL STENT PLACEMENT;  Surgeon: Raynelle Bring, MD;  Location: [facility];  Service: Urology;  Laterality: Left;   EYE SURGERY Bilateral    lens replacement 2014   hx of etopic pregnancy  2005   LITHOTRIPSY  2011   OOPHORECTOMY  07/2008   s/p left knee arthroscopic  1994    Prior to Admission medications   Medication Sig Start Date End Date Taking? Authorizing Provider  ciprofloxacin (CIPRO) 500 MG tablet Take 1 tablet (500 mg total) by mouth 2 (two) times daily for 7 days. 07/03/21 07/10/21 Yes Syrita Dovel, Vista Lawman, MD  HYDROcodone-acetaminophen (NORCO) 5-325 MG tablet Take 1 tablet by mouth every 4 (four) hours as needed for moderate pain. 07/03/21  Yes Naaman Plummer, MD  metroNIDAZOLE (FLAGYL) 500 MG tablet Take 1 tablet (500 mg total) by mouth 3 (three) times daily for 7 days. 07/03/21 07/10/21 Yes Teofil Maniaci, Vista Lawman, MD  benzonatate (TESSALON) 200 MG capsule Take 1 capsule (200 mg total) by mouth 3 (three) times daily as needed. Patient not taking: Reported on 10/25/2020 11/30/19   Tonia Ghent, MD  calcium-vitamin D (OSCAL WITH D) 500-200 MG-UNIT tablet Take 1 tablet by mouth daily with breakfast.    [provider]  EPINEPHRINE  0.3 mg/0.3 mL IJ SOAJ injection INJECT 0.3 MLS INTO THE MUSCLE ONCE FOR 1 DOSE. 12/14/19   Tonia Ghent, MD  methylphenidate (RITALIN) 10 MG tablet Take 1.5-2 tablets (15-20 mg total) by mouth 2 (two) times daily. 05/03/21   Tonia Ghent, MD  ondansetron (ZOFRAN-ODT) 4 MG disintegrating tablet Take 1 tablet (4 mg total) by mouth every 8 (eight) hours as needed. Patient not taking: Reported  on 10/25/2020 06/07/20   Versie Starks, PA-C  prochlorperazine (COMPAZINE) 10 MG tablet Take by mouth. 04/20/20 04/21/21  [provider]  tiZANidine (ZANAFLEX) 4 MG tablet TAKE 1 TABLET BY MOUTH EVERY 6 HOURS AS NEEDED 06/25/21   Tonia Ghent, MD  topiramate (TOPAMAX) 25 MG tablet PLEASE SEE ATTACHED FOR DETAILED DIRECTIONS 03/18/20   [provider]  zolpidem (AMBIEN CR) 12.5 MG CR tablet TAKE 1 TABLET BY MOUTH EVERYDAY AT BEDTIME 06/12/21   Tonia Ghent, MD    Allergies Amoxicillin-pot clavulanate, Latex, Aspirin, Eggs or egg-derived products, Ibuprofen, Nsaids, Other, Ozempic (0.25 or 0.5 mg-dose) [semaglutide(0.25 or 0.5mg -dos)], and Red dye  Family History  Problem Relation Age of Onset   Alcohol abuse Mother        ETOH   Bipolar disorder Mother        manic depressive/bipolar   Thyroid disease Mother        Low thyroid   Hypertension Father    Hypertension Other    Diabetes Other    Heart disease Other    Stroke Other    Cancer Other        Lung Cancer   Colon cancer Maternal Grandmother    Breast cancer Paternal Grandmother     Social History Social History   Tobacco Use   Smoking status: Former    Packs/day: 1.00    Years: 17.00    Pack years: 17.00    Types: Cigarettes    Quit date: 2017    Years since quitting: 5.9   Smokeless tobacco: Never  Vaping Use   Vaping Use: Never used  Substance Use Topics   Alcohol use: Yes    Alcohol/week: 0.0 standard drinks    Comment: rare   Drug use: No    Review of Systems Constitutional: No fever/chills Eyes: No visual changes. ENT: No sore throat. Cardiovascular: Denies chest pain. Respiratory: Denies shortness of breath. Gastrointestinal: Endorses left side abdominal pain.  No nausea, no vomiting.  Endorses watery diarrhea.  Denies hematochezia, hematemesis, or melena Genitourinary: Negative for dysuria. Musculoskeletal: Negative for acute arthralgias Skin: Negative for  rash. Neurological: Negative for headaches, weakness/numbness/paresthesias in any extremity Psychiatric: Negative for suicidal ideation/homicidal ideation   ____________________________________________   PHYSICAL EXAM:  VITAL SIGNS: ED Triage Vitals  Enc Vitals Group     BP 07/03/21 1402 140/86     Pulse Rate 07/03/21 1402 90     Resp 07/03/21 1402 18     Temp 07/03/21 1415 98.7 F (37.1 C)     Temp src --      SpO2 07/03/21 1402 98 %     Weight 07/03/21 1400 179 lb 14.3 oz (81.6 kg)     Height 07/03/21 1400 5\' 8"  (1.727 m)     Head Circumference --      Peak Flow --      Pain Score 07/03/21 1400 7     Pain Loc --      Pain Edu? --      Excl. in [place]? --  Constitutional: Alert and oriented. Well appearing and in no acute distress. Eyes: Conjunctivae are normal. PERRL. Head: Atraumatic. Nose: No congestion/rhinnorhea. Mouth/Throat: Mucous membranes are moist. Neck: No stridor Cardiovascular: Grossly normal heart sounds.  Good peripheral circulation. Respiratory: Normal respiratory effort.  No retractions. Gastrointestinal: Soft and generalized tenderness to palpation. No distention. Musculoskeletal: No obvious deformities Neurologic:  Normal speech and language. No gross focal neurologic deficits are appreciated. Skin:  Skin is warm and dry. No rash noted. Psychiatric: Mood and affect are normal. Speech and behavior are normal.  ____________________________________________   LABS (all labs ordered are listed, but only abnormal results are displayed)  Labs Reviewed  URINALYSIS, ROUTINE W REFLEX MICROSCOPIC - Abnormal; Notable for the following components:      Result Value   Color, Urine YELLOW (*)    APPearance CLEAR (*)    Hgb urine dipstick MODERATE (*)    Ketones, ur 5 (*)    All other components within normal limits  BASIC METABOLIC PANEL - Abnormal; Notable for the following components:   Glucose, Bld 108 (*)    All other components within normal limits   CBC - Abnormal; Notable for the following components:   WBC 23.9 (*)    RBC 5.48 (*)    Hemoglobin 15.8 (*)    HCT 47.1 (*)    All other components within normal limits  POC URINE PREG, ED - Normal  C DIFFICILE QUICK SCREEN W PCR REFLEX     RADIOLOGY  ED MD interpretation: CT of the abdomen pelvis with IV contrast shows mild wall thickening of the sigmoid colon and rectum suggestive of proctocolitis  Official radiology report(s): CT Abdomen Pelvis W Contrast  Result Date: 07/03/2021 CLINICAL DATA:  Abdominal pain x2 days with back pain and bilateral flank pain. EXAM: CT ABDOMEN AND PELVIS WITH CONTRAST TECHNIQUE: Multidetector CT imaging of the abdomen and pelvis was performed using the standard protocol following bolus administration of intravenous contrast. CONTRAST:  1100mL OMNIPAQUE IOHEXOL 300 MG/ML  SOLN COMPARISON:  October 03, 2020 FINDINGS: Lower chest: No acute abnormality. Hepatobiliary: No suspicious hepatic lesion. No portal venous gas. Gallbladder is unremarkable. No biliary ductal dilation. Pancreas: No pancreatic ductal dilation or evidence of acute inflammation. Spleen: Normal in size without focal abnormality. Adrenals/Urinary Tract: Bilateral adrenal glands appear normal. No hydronephrosis. Bilateral nonobstructive nephrolithiasis measuring up to 3-4 mm on the right and 2-3 mm on the left. No obstructive ureteral or bladder calculi identified. Bilateral subcentimeter hypodense renal lesions which are technically too small to accurately characterize but statistically likely reflect cysts. Urinary bladder is unremarkable for degree of distension. Stomach/Bowel: No enteric contrast was administered. Stomach is unremarkable for degree of distension. No pathologic dilation of large or small bowel. The appendix is surgically absent. I wondered if he knew the Mrs. Again mild wall thickening of the sigmoid colon and rectum. No pneumatosis. Vascular/Lymphatic: No pathologically enlarged  abdominal or pelvic lymph nodes. No abdominal aortic aneurysm. Reproductive: Status post hysterectomy. No adnexal masses. Other: No abdominopelvic free fluid. No walled off fluid collections. No pneumoperitoneum. Musculoskeletal: No acute or significant osseous findings. IMPRESSION: 1. Mild wall thickening of the sigmoid colon and rectum suggestive of proctocolitis. 2. Bilateral nonobstructive nephrolithiasis. Electronically Signed   By: Dahlia Bailiff M.D.   On: 07/03/2021 15:32    ____________________________________________   PROCEDURES  Procedure(s) performed (including Critical Care):  .1-3 Lead EKG Interpretation Performed by: Naaman Plummer, MD Authorized by: Naaman Plummer, MD     Interpretation: normal  ECG rate:  91   ECG rate assessment: normal     Rhythm: sinus rhythm     Ectopy: none     Conduction: normal     ____________________________________________   INITIAL IMPRESSION / ASSESSMENT AND PLAN / ED COURSE  As part of my medical decision making, I reviewed the following data within the electronic medical record, if available:  Nursing notes reviewed and incorporated, Labs reviewed, EKG interpreted, Old chart reviewed, Radiograph reviewed and Notes from prior ED visits reviewed and incorporated      This patient presents with diarrhea consistent with likely viral enteritis. Doubt acute bacterial diarrhea. Considered, but think unlikely, partial SBO, appendicitis, diverticulitis, other intraabdominal infection. Low suspicion for secondary causes of diarrhea such as hyperadrenergic state, pheo, adrenal crisis, hyperthyroidism, or sepsis. Doubt antibiotic associated diarrhea.  Plan: PO rehydration, reassess, discharge with OTC antidiarrheal meds//short course antibiotics  Dispo: Discharge home with PCP follow-up and strict return precautions    ____________________________________________  FINAL CLINICAL IMPRESSION(S) / ED DIAGNOSES  Final diagnoses:   Generalized abdominal pain  Proctocolitis  Diarrhea of presumed infectious origin     ED Discharge Orders          Ordered    ciprofloxacin (CIPRO) 500 MG tablet  2 times daily        07/03/21 2154    metroNIDAZOLE (FLAGYL) 500 MG tablet  3 times daily        07/03/21 2154    HYDROcodone-acetaminophen (NORCO) 5-325 MG tablet  Every 4 hours PRN        07/03/21 2154             Note:  This document was prepared using Dragon voice recognition software and may include unintentional dictation errors.    Naaman Plummer, MD 07/04/21 [phone]

## 2021-07-11 ENCOUNTER — Other Ambulatory Visit: Payer: Self-pay | Admitting: Family Medicine

## 2021-07-12 NOTE — Telephone Encounter (Signed)
Refill request for tizanidine 4 mg tablets  LOV - 06/11/20 Next OV - 08/06/21 Last refill - 06/25/21 #60/0

## 2021-07-21 ENCOUNTER — Other Ambulatory Visit: Payer: Self-pay | Admitting: Family Medicine

## 2021-07-21 DIAGNOSIS — G43109 Migraine with aura, not intractable, without status migrainosus: Secondary | ICD-10-CM

## 2021-07-23 ENCOUNTER — Other Ambulatory Visit: Payer: Self-pay | Admitting: Family Medicine

## 2021-07-23 NOTE — Telephone Encounter (Signed)
Refill request Ritalin Last refill 05/03/21 #120 Last office visit 06/11/20 Upcoming appointment 08/06/21 Last UDS 10/01/17

## 2021-07-24 MED ORDER — METHYLPHENIDATE HCL 10 MG PO TABS: 15.0000 mg | ORAL_TABLET | Freq: Two times a day (BID) | ORAL | 0 refills | Status: DC

## 2021-07-30 ENCOUNTER — Other Ambulatory Visit: Payer: 59

## 2021-08-01 ENCOUNTER — Other Ambulatory Visit: Payer: Self-pay | Admitting: Family Medicine

## 2021-08-01 NOTE — Telephone Encounter (Signed)
Refill request for zanaflex 4 mg tabs  LOV - 06/11/20 Next OV - 09/03/21 Last refill - 07/14/21 #60/0

## 2021-08-06 ENCOUNTER — Encounter: Payer: 59 | Admitting: Family Medicine

## 2021-08-19 ENCOUNTER — Other Ambulatory Visit (INDEPENDENT_AMBULATORY_CARE_PROVIDER_SITE_OTHER): Payer: 59

## 2021-08-19 ENCOUNTER — Other Ambulatory Visit: Payer: Self-pay

## 2021-08-19 DIAGNOSIS — G43109 Migraine with aura, not intractable, without status migrainosus: Secondary | ICD-10-CM

## 2021-08-19 LAB — CBC WITH DIFFERENTIAL/PLATELET
Basophils Absolute: 0.1 10*3/uL (ref 0.0–0.1)
Basophils Relative: 0.6 % (ref 0.0–3.0)
Eosinophils Absolute: 0.5 10*3/uL (ref 0.0–0.7)
Eosinophils Relative: 4.8 % (ref 0.0–5.0)
HCT: 44.8 % (ref 36.0–46.0)
Hemoglobin: 15 g/dL (ref 12.0–15.0)
Lymphocytes Relative: 20.7 % (ref 12.0–46.0)
Lymphs Abs: 2.1 10*3/uL (ref 0.7–4.0)
MCHC: 33.6 g/dL (ref 30.0–36.0)
MCV: 84.6 fl (ref 78.0–100.0)
Monocytes Absolute: 0.9 10*3/uL (ref 0.1–1.0)
Monocytes Relative: 8.9 % (ref 3.0–12.0)
Neutro Abs: 6.6 10*3/uL (ref 1.4–7.7)
Neutrophils Relative %: 65 % (ref 43.0–77.0)
Platelets: 334 10*3/uL (ref 150.0–400.0)
RBC: 5.29 Mil/uL — ABNORMAL HIGH (ref 3.87–5.11)
RDW: 12.6 % (ref 11.5–15.5)
WBC: 10.2 10*3/uL (ref 4.0–10.5)

## 2021-08-19 LAB — TSH: TSH: 3.73 u[IU]/mL (ref 0.35–5.50)

## 2021-08-20 ENCOUNTER — Other Ambulatory Visit: Payer: Self-pay | Admitting: Family Medicine

## 2021-08-20 LAB — COMPREHENSIVE METABOLIC PANEL
ALT: 21 U/L (ref 0–35)
AST: 20 U/L (ref 0–37)
Albumin: 4.8 g/dL (ref 3.5–5.2)
Alkaline Phosphatase: 72 U/L (ref 39–117)
BUN: 15 mg/dL (ref 6–23)
CO2: 27 mEq/L (ref 19–32)
Calcium: 10.3 mg/dL (ref 8.4–10.5)
Chloride: 105 mEq/L (ref 96–112)
Creatinine, Ser: 1.13 mg/dL (ref 0.40–1.20)
GFR: 60.4 mL/min (ref >60.00)
Glucose, Bld: 112 mg/dL — ABNORMAL HIGH (ref 70–99)
Potassium: 4.2 mEq/L (ref 3.5–5.1)
Sodium: 139 mEq/L (ref 135–145)
Total Bilirubin: 0.5 mg/dL (ref 0.2–1.2)
Total Protein: 7.7 g/dL (ref 6.0–8.3)

## 2021-08-20 NOTE — Telephone Encounter (Signed)
Refill request for zanaflex 4 mg tablets  LOV - 06/11/20 Next OV - 09/03/21 Last refill - 08/02/21 #60/0

## 2021-08-22 NOTE — Telephone Encounter (Signed)
Sent. Thanks.   

## 2021-09-03 ENCOUNTER — Ambulatory Visit (INDEPENDENT_AMBULATORY_CARE_PROVIDER_SITE_OTHER): Payer: 59 | Admitting: Family Medicine

## 2021-09-03 ENCOUNTER — Other Ambulatory Visit: Payer: Self-pay

## 2021-09-03 ENCOUNTER — Encounter: Payer: Self-pay | Admitting: Family Medicine

## 2021-09-03 ENCOUNTER — Telehealth: Payer: Self-pay

## 2021-09-03 VITALS — BP 118/78 | HR 92 | Temp 97.8°F | Ht 68.0 in | Wt 173.0 lb

## 2021-09-03 DIAGNOSIS — G4700 Insomnia, unspecified: Secondary | ICD-10-CM

## 2021-09-03 DIAGNOSIS — G43109 Migraine with aura, not intractable, without status migrainosus: Secondary | ICD-10-CM

## 2021-09-03 DIAGNOSIS — Z23 Encounter for immunization: Secondary | ICD-10-CM

## 2021-09-03 DIAGNOSIS — G4733 Obstructive sleep apnea (adult) (pediatric): Secondary | ICD-10-CM

## 2021-09-03 DIAGNOSIS — Z7189 Other specified counseling: Secondary | ICD-10-CM

## 2021-09-03 DIAGNOSIS — Z0000 Encounter for general adult medical examination without abnormal findings: Secondary | ICD-10-CM | POA: Diagnosis not present

## 2021-09-03 DIAGNOSIS — F988 Other specified behavioral and emotional disorders with onset usually occurring in childhood and adolescence: Secondary | ICD-10-CM

## 2021-09-03 DIAGNOSIS — Z780 Asymptomatic menopausal state: Secondary | ICD-10-CM

## 2021-09-03 DIAGNOSIS — N200 Calculus of kidney: Secondary | ICD-10-CM

## 2021-09-03 DIAGNOSIS — Z8719 Personal history of other diseases of the digestive system: Secondary | ICD-10-CM

## 2021-09-03 DIAGNOSIS — K859 Acute pancreatitis without necrosis or infection, unspecified: Secondary | ICD-10-CM

## 2021-09-03 MED ORDER — ZOLPIDEM TARTRATE ER 6.25 MG PO TBCR: 6.2500 mg | EXTENDED_RELEASE_TABLET | Freq: Every evening | ORAL | 1 refills | Status: DC | PRN

## 2021-09-03 NOTE — Progress Notes (Signed)
This visit occurred during the SARS-CoV-2 public health emergency.  Safety protocols were in place, including screening questions prior to the visit, additional usage of staff PPE, and extensive cleaning of exam room while observing appropriate contact time as indicated for disinfecting solutions.  CPE- See plan.  Routine anticipatory guidance given to patient.  See health maintenance.  The possibility exists that previously documented standard health maintenance information may have been brought forward from a previous encounter into this note.  If needed, that same information has been updated to reflect the current situation based on today's encounter.    Tetanus 2015 Flu-discussed with patient. PNA and shingles not due.  Covid prev done.   Mammogram d/w pt.  Pap not indicated.  S/p hysterectomy.  TAH and BSO.  No hot flashes usually.   DXA ordered 2023, given her history of kidney stones and ongoing medication use, postmenopausal status. Living will d/w pt.  Husband designated if patient were incapacitated.  Diet and exercise d/w pt.   Pt d/w pt re: HIV screening.  Prev done 2005.    She has intentional weight loss and I thanked her for her effort.  Low carb diet.  She cut back on portions.  She is exercising.    H/o pancreatitis.  She had a flare with greasy foods.  D/w pt about GI eval.  Referral placed.   She didn't think her current meds were contributing.  No abd pain now.    Migraine per neurology clinic. Doing well with topamax, d/w pt about possible renal stone association.  She has known stones, with neurology f/u pending.   Still using CPAP.  Doing well with that.  Compliant.  Getting better sleep with use.    Attention d/w pt.  Not taking ritalin on the weekend unless there is a sig event/indication.  It helps.  No ADE on med.  She can tell on/off med and others have noted it too.  No ADE on med.    Insomnia improved. CPAP helped a lot.  Taking ambien most nights.    PMH and  SH reviewed  Meds, vitals, and allergies reviewed.   ROS: Per HPI.  Unless specifically indicated otherwise in HPI, the patient denies:  General: fever. Eyes: acute vision changes ENT: sore throat Cardiovascular: chest pain Respiratory: SOB GI: vomiting GU: dysuria Musculoskeletal: acute back pain Derm: acute rash Neuro: acute motor dysfunction Psych: worsening mood Endocrine: polydipsia Heme: bleeding Allergy: hayfever  GEN: nad, alert and oriented HEENT: ncat NECK: supple w/o LA CV: rrr. PULM: ctab, no inc wob ABD: soft, +bs EXT: no edema SKIN: Well-perfused.

## 2021-09-03 NOTE — Telephone Encounter (Signed)
CALLED PATIENT NO ANSWER LEFT VOICEMAIL FOR A CALL BACK ? ?

## 2021-09-03 NOTE — Patient Instructions (Addendum)
Ask neurology about topamax and kidney stones.   Try the lower dose of ambien and let me know if that doesn't work.  We'll call about seeing GI.    You can call for a mammogram at the [facility] [phone] We'll call about the bone density test.   Thanks for your effort.  Take care.  Glad to see you.

## 2021-09-04 ENCOUNTER — Telehealth: Payer: Self-pay

## 2021-09-04 NOTE — Assessment & Plan Note (Signed)
I asked her to check with neurology about her Topamax use given her history of stones. ?

## 2021-09-04 NOTE — Assessment & Plan Note (Signed)
Living will d/w pt.  Husband designated if patient were incapacitated.  

## 2021-09-04 NOTE — Assessment & Plan Note (Signed)
Improved with Reglan.  Would continue as is.  No adverse effect on medication. ?

## 2021-09-04 NOTE — Assessment & Plan Note (Signed)
Per neurology.  I asked her to check with neurology about Topamax use given her history of renal stones. ?

## 2021-09-04 NOTE — Assessment & Plan Note (Addendum)
Tetanus 2015 ?Flu-discussed with patient. ?PNA and shingles not due.  ?Covid prev done.   ?Mammogram d/w pt.  ?Pap not indicated.  S/p hysterectomy.  TAH and BSO.  No hot flashes usually.   ?DXA ordered 2023 ?Living will d/w pt.  Husband designated if patient were incapacitated.  ?Diet and exercise d/w pt.   ?Pt d/w pt re: HIV screening.  Prev done 2005.   ?

## 2021-09-04 NOTE — Assessment & Plan Note (Signed)
History of ?She had a flare with greasy foods.  D/w pt about GI eval.  Referral placed.   She didn't think her current meds were contributing.  No abd pain now.  Okay for outpatient follow-up. ? ?

## 2021-09-04 NOTE — Telephone Encounter (Signed)
Scheduled for 11/21/2021 ?

## 2021-09-04 NOTE — Assessment & Plan Note (Signed)
Discussed trying to wean Ambien down to 6.25 mg at night.  Continue CPAP. ?

## 2021-09-04 NOTE — Assessment & Plan Note (Signed)
Continue CPAP.  

## 2021-09-09 ENCOUNTER — Other Ambulatory Visit: Payer: Self-pay | Admitting: Family Medicine

## 2021-09-20 ENCOUNTER — Other Ambulatory Visit: Payer: Self-pay | Admitting: Family Medicine

## 2021-10-07 ENCOUNTER — Ambulatory Visit
Admission: RE | Admit: 2021-10-07 | Discharge: 2021-10-07 | Disposition: A | Discharge status: Home / Self Care | Payer: 59 | Source: Ambulatory Visit | Attending: Family Medicine | Admitting: Family Medicine

## 2021-10-07 DIAGNOSIS — Z780 Asymptomatic menopausal state: Secondary | ICD-10-CM

## 2021-10-09 ENCOUNTER — Other Ambulatory Visit: Payer: Self-pay | Admitting: Family Medicine

## 2021-10-09 ENCOUNTER — Encounter: Payer: Self-pay | Admitting: Family Medicine

## 2021-10-09 DIAGNOSIS — M8580 Other specified disorders of bone density and structure, unspecified site: Secondary | ICD-10-CM | POA: Insufficient documentation

## 2021-10-09 MED ORDER — METHYLPHENIDATE HCL 10 MG PO TABS: 15.0000 mg | ORAL_TABLET | Freq: Two times a day (BID) | ORAL | 0 refills | Status: DC

## 2021-10-09 MED ORDER — VITAMIN D3 50 MCG (2000 UT) PO CAPS: 2000.0000 [IU] | ORAL_CAPSULE | Freq: Every day | ORAL | Status: AC

## 2021-10-09 NOTE — Telephone Encounter (Signed)
Refill request for methylphenidate (RITALIN) 10 MG tablet ? ?LOV - 09/03/21 ?Next OV - 10/22/21 ?Last refill - 07/24/21 #120/0 ? ?

## 2021-10-18 ENCOUNTER — Other Ambulatory Visit: Payer: Self-pay | Admitting: Family Medicine

## 2021-10-22 ENCOUNTER — Ambulatory Visit: Payer: 59 | Admitting: Family Medicine

## 2021-11-01 ENCOUNTER — Other Ambulatory Visit: Payer: Self-pay | Admitting: Family Medicine

## 2021-11-21 ENCOUNTER — Ambulatory Visit: Payer: 59 | Admitting: Gastroenterology

## 2021-11-21 ENCOUNTER — Encounter: Payer: Self-pay | Admitting: Gastroenterology

## 2021-11-21 VITALS — BP 123/86 | HR 101 | Temp 99.1°F | Ht 68.0 in | Wt 177.0 lb

## 2021-11-21 DIAGNOSIS — K5900 Constipation, unspecified: Secondary | ICD-10-CM

## 2021-11-21 DIAGNOSIS — R1031 Right lower quadrant pain: Secondary | ICD-10-CM

## 2021-11-21 DIAGNOSIS — K625 Hemorrhage of anus and rectum: Secondary | ICD-10-CM

## 2021-11-21 DIAGNOSIS — R748 Abnormal levels of other serum enzymes: Secondary | ICD-10-CM

## 2021-11-21 DIAGNOSIS — G8929 Other chronic pain: Secondary | ICD-10-CM

## 2021-11-21 DIAGNOSIS — R1013 Epigastric pain: Secondary | ICD-10-CM

## 2021-11-21 MED ORDER — OMEPRAZOLE 40 MG PO CPDR: 40.0000 mg | DELAYED_RELEASE_CAPSULE | Freq: Every day | ORAL | 1 refills | Status: DC

## 2021-11-21 MED ORDER — LINACLOTIDE 290 MCG PO CAPS: 290.0000 ug | ORAL_CAPSULE | Freq: Every day | ORAL | 0 refills | Status: DC

## 2021-11-21 MED ORDER — NA SULFATE-K SULFATE-MG SULF 17.5-3.13-1.6 GM/177ML PO SOLN: 1.0000 | Freq: Once | ORAL | 0 refills | Status: AC

## 2021-11-21 NOTE — Progress Notes (Signed)
Jonathon Bellows MD, MRCP(U.K) [id number]  [place]  [place], [place] [address]  Main: [phone]  Fax: [phone]   Gastroenterology Consultation  Referring Provider:     Tonia Ghent, MD Primary Care Physician:  Tonia Ghent, MD Primary Gastroenterologist:  Dr. Jonathon Bellows  Reason for Consultation:     Pancreatitis        HPI:   [name] is a 41 y.o. y/o female referred for consultation & management  by Dr. Damita Dunnings, Elveria Rising, MD.    Referred for pancreatitis.  She presented to the emergency room on 07/03/2021 with left-sided abdominal pain of 2 days duration was epigastric in location, associated with diarrhea.  In the emergency room underwent a CT scan of the abdomen showed mild wall thickening of the sigmoid colon and rectum suggesting of proctocolitis no abnormalities of the pancreas.  LFTs were normal, testing for C. difficile was negative, white cell count was 23,000 with a hemoglobin of 15.8 g.  ER note suggest that she was treated for a viral enteritis discharged home.  Seen by Dr. Damita Dunnings in February 2023 was mention about pancreatitis Reviewing her labs in December 2021 her lipase is elevated at 248 at the same time CT scan of the abdomen performed for abdominal pain showed no evidence of pancreatitis.  She states that for the past few months she has had lower abdominal pain episodic when she eats greasy food last for few hours.  Radiates to her left flank.  She is having some constipation does not have a bowel movement for at least 3 days in a row and when she has to have a bowel movement very hard.  She been having some rectal bleeding for a few months in duration with blood on the tissue paper and on the stool.  No change in the shape of the stool no prior colonoscopy no family history of colon cancer or polyps.  Grandmother had a history of pancreatitis and an unknown reason no family history of pancreatic cancer denies any excess alcohol use, no recent  change in medication or new medications no herbal supplements no OTC meds.  No unintentional weight last.  Past Medical History:  Diagnosis Date   ADD (attention deficit disorder)    testing done 2016   ALLERGIC RHINITIS 10/11/2009   Allergy to latex 04/11/2010   ANKLE PAIN, RIGHT 04/03/2009   BACK PAIN 03/19/2009   COMMON MIGRAINE 03/19/2009   FLANK PAIN, RIGHT 05/11/2009   GLUCOSE INTOLERANCE 03/19/2009   HYDRONEPHROSIS, RIGHT 05/11/2009   INSOMNIA-SLEEP DISORDER-UNSPEC 03/19/2009   Left tibial fracture 2018   Migraine    NEPHROLITHIASIS, HX OF 03/19/2009   RENAL CALCULUS, RIGHT 05/11/2009    Past Surgical History:  Procedure Laterality Date   ABDOMINAL HYSTERECTOMY     APPENDECTOMY  1994   CYSTOSCOPY W/ URETERAL STENT PLACEMENT Left 09/24/2014   Procedure: CYSTOSCOPY WITH RETROGRADE PYELOGRAM/LEFT URETERAL STENT PLACEMENT;  Surgeon: Raynelle Bring, MD;  Location: [facility];  Service: Urology;  Laterality: Left;   EYE SURGERY Bilateral    lens replacement 2014   hx of etopic pregnancy  2005   LITHOTRIPSY  2011   OOPHORECTOMY  07/2008   s/p left knee arthroscopic  1994    Prior to Admission medications   Medication Sig Start Date End Date Taking? Authorizing Provider  Cholecalciferol (VITAMIN D3) 50 MCG (2000 UT) capsule Take 1 capsule (2,000 Units total) by mouth daily. 10/09/21   Tonia Ghent, MD  calcium-vitamin D (OSCAL WITH D) 500-200 MG-UNIT tablet Take 1 tablet by mouth daily with breakfast.    [provider]  EPINEPHRINE 0.3 mg/0.3 mL IJ SOAJ injection INJECT 0.3 MLS INTO THE MUSCLE ONCE FOR 1 DOSE. 12/14/19   Tonia Ghent, MD  methylphenidate (RITALIN) 10 MG tablet Take 1.5-2 tablets (15-20 mg total) by mouth 2 (two) times daily. 10/09/21   Tonia Ghent, MD  prochlorperazine (COMPAZINE) 10 MG tablet Take by mouth. 04/20/20 09/03/21  [provider]  tiZANidine (ZANAFLEX) 4 MG tablet TAKE 1 TABLET BY MOUTH EVERY 6 HOURS AS NEEDED 11/01/21   Tonia Ghent, MD   topiramate (TOPAMAX) 25 MG tablet PLEASE SEE ATTACHED FOR DETAILED DIRECTIONS 03/18/20   [provider]  zolpidem (AMBIEN CR) 6.25 MG CR tablet Take 1 tablet (6.25 mg total) by mouth at bedtime as needed for sleep (for next refill, attempt to taper dose.). 09/03/21   Tonia Ghent, MD    Family History  Problem Relation Age of Onset   Alcohol abuse Mother        ETOH   Bipolar disorder Mother        manic depressive/bipolar   Thyroid disease Mother        Low thyroid   Hypertension Father    Hypertension Other    Diabetes Other    Heart disease Other    Stroke Other    Cancer Other        Lung Cancer   Colon cancer Maternal Grandmother    Breast cancer Paternal Grandmother      Social History   Tobacco Use   Smoking status: Former    Packs/day: 1.00    Years: 17.00    Pack years: 17.00    Types: Cigarettes    Quit date: 2017    Years since quitting: 6.3   Smokeless tobacco: Never  Vaping Use   Vaping Use: Never used  Substance Use Topics   Alcohol use: Yes    Alcohol/week: 0.0 standard drinks    Comment: rare   Drug use: No    Allergies as of 11/21/2021 - Review Complete 11/21/2021  Allergen Reaction Noted   Amoxicillin-pot clavulanate Swelling 03/19/2009   Latex  04/11/2010   Aspirin Other (See Comments) 03/19/2009   Eggs or egg-derived products Other (See Comments) 08/29/2015   Ibuprofen  03/19/2009   Nsaids  03/19/2009   Other Other (See Comments) 11/29/2014   Ozempic (0.25 or 0.5 mg-dose) [semaglutide(0.25 or 0.'5mg'$ -dos)]  06/11/2020   Red dye  11/21/2011    Review of Systems:    All systems reviewed and negative except where noted in HPI.   Physical Exam:  BP 123/86   Pulse (!) 101   Temp 99.1 F (37.3 C) (Oral)   Ht '5\' 8"'$  (1.727 m)   Wt 177 lb (80.3 kg)   BMI 26.91 kg/m  No LMP recorded. Patient has had a hysterectomy. Psych:  Alert and cooperative. Normal mood and affect. General:   Alert,  Well-developed, well-nourished,  pleasant and cooperative in NAD Head:  Normocephalic and atraumatic. Eyes:  Sclera clear, no icterus.   Conjunctiva pink. Ears:  Normal auditory acuity. Neck:  Supple; no masses or thyromegaly. Lungs:  Respirations even and unlabored.  Clear throughout to auscultation.   No wheezes, crackles, or rhonchi. No acute distress. Heart:  Regular rate and rhythm; no murmurs, clicks, rubs, or gallops. Abdomen:  Normal bowel sounds.  No bruits.  Soft, non-tender and non-distended without  masses, hepatosplenomegaly or hernias noted.  No guarding or rebound tenderness.    Neurologic:  Alert and oriented x3;  grossly normal neurologically. Psych:  Alert and cooperative. Normal mood and affect.  Imaging Studies: No results found.  Assessment and Plan:   [name] is a 41 y.o. y/o female has been referred for pancreatitis.  I reviewed her labs and imaging there has been no radiological evidence of pancreatitis at any point.  Her abdominal pain location and description does not fit that of pancreatitis.  I explained to her that the lipase can be elevated for multiple reasons including peptic ulcers.  She does have a history of postprandial pain which can be seen with gastric ulcers as well as in biliary colic.  She definitely does suffer from constipation may have a component of IBS constipation, she also history history of rectal bleeding which needs evaluation which could be due to constipation and hemorrhoids but needs to be ruled out  Plan 1.  High-fiber diet patient information hemorrhoids provided discussed about conservative management including sitz bath, perianal, toileting and hygiene.  If does not respond and she does have hemorrhoids and a colonoscopy which I will schedule for her next week then can proceed with banding of the hemorrhoids which I have discussed in detail.  2.  Trial of Prilosec 40 mg once a day for 8 weeks.  H. pylori breath test  3.  Trial of Linzess to 90 mcg a day 1  week samples have been provided.  She has tried MiraLAX previously which has failed.   4.  If above interventions do not help we will proceed with biliary evaluation although pretest probably is low as she has had normal liver tests in the past  5.  If next visit her abdominal pain is no better we will also consider upper endoscopy.   I have discussed alternative options, risks & benefits,  which include, but are not limited to, bleeding, infection, perforation,respiratory complication & drug reaction.  The patient agrees with this plan & written consent will be obtained.     Follow up in weeks for possible banding of hemorrhoids if still symptomatic  Dr Jonathon Bellows MD,MRCP(U.K)

## 2021-11-21 NOTE — Addendum Note (Signed)
Addended by: Eliseo Squires on: 11/21/2021 03:41 PM   Modules accepted: Orders

## 2021-11-22 ENCOUNTER — Other Ambulatory Visit: Payer: Self-pay | Admitting: Family Medicine

## 2021-11-22 LAB — H. PYLORI BREATH TEST: H pylori Breath Test: NEGATIVE

## 2021-11-24 ENCOUNTER — Other Ambulatory Visit: Payer: Self-pay | Admitting: Gastroenterology

## 2021-11-25 ENCOUNTER — Encounter: Payer: Self-pay | Admitting: Gastroenterology

## 2021-11-25 ENCOUNTER — Encounter
Admission: RE | Disposition: A | Discharge status: Home / Self Care | Payer: Self-pay | Source: Home / Self Care | Attending: Gastroenterology

## 2021-11-25 ENCOUNTER — Ambulatory Visit: Payer: 59 | Admitting: Registered Nurse

## 2021-11-25 ENCOUNTER — Other Ambulatory Visit: Payer: Self-pay

## 2021-11-25 ENCOUNTER — Other Ambulatory Visit: Payer: Self-pay | Admitting: Gastroenterology

## 2021-11-25 ENCOUNTER — Ambulatory Visit
Admission: RE | Admit: 2021-11-25 | Discharge: 2021-11-25 | Disposition: A | Discharge status: Home / Self Care | Payer: 59 | Attending: Gastroenterology | Admitting: Gastroenterology

## 2021-11-25 DIAGNOSIS — Z87891 Personal history of nicotine dependence: Secondary | ICD-10-CM | POA: Diagnosis not present

## 2021-11-25 DIAGNOSIS — K625 Hemorrhage of anus and rectum: Secondary | ICD-10-CM | POA: Insufficient documentation

## 2021-11-25 DIAGNOSIS — G4730 Sleep apnea, unspecified: Secondary | ICD-10-CM | POA: Insufficient documentation

## 2021-11-25 DIAGNOSIS — K621 Rectal polyp: Secondary | ICD-10-CM | POA: Insufficient documentation

## 2021-11-25 DIAGNOSIS — K635 Polyp of colon: Secondary | ICD-10-CM

## 2021-11-25 DIAGNOSIS — F988 Other specified behavioral and emotional disorders with onset usually occurring in childhood and adolescence: Secondary | ICD-10-CM | POA: Insufficient documentation

## 2021-11-25 DIAGNOSIS — K640 First degree hemorrhoids: Secondary | ICD-10-CM | POA: Diagnosis not present

## 2021-11-25 DIAGNOSIS — F419 Anxiety disorder, unspecified: Secondary | ICD-10-CM | POA: Insufficient documentation

## 2021-11-25 HISTORY — PX: COLONOSCOPY WITH PROPOFOL: SHX5780

## 2021-11-25 SURGERY — COLONOSCOPY WITH PROPOFOL
Anesthesia: General

## 2021-11-25 MED ORDER — PROPOFOL 500 MG/50ML IV EMUL
INTRAVENOUS | Status: AC
  Filled 2021-11-25: qty 50

## 2021-11-25 MED ORDER — DEXMEDETOMIDINE (PRECEDEX) IN NS 20 MCG/5ML (4 MCG/ML) IV SYRINGE
PREFILLED_SYRINGE | INTRAVENOUS | Status: DC | PRN
  Administered 2021-11-25: 12 ug via INTRAVENOUS

## 2021-11-25 MED ORDER — SODIUM CHLORIDE 0.9 % IV SOLN: INTRAVENOUS | Status: DC

## 2021-11-25 MED ORDER — PHENYLEPHRINE HCL (PRESSORS) 10 MG/ML IV SOLN
INTRAVENOUS | Status: AC
  Filled 2021-11-25: qty 1

## 2021-11-25 MED ORDER — LIDOCAINE HCL (CARDIAC) PF 100 MG/5ML IV SOSY
PREFILLED_SYRINGE | INTRAVENOUS | Status: DC | PRN
  Administered 2021-11-25: 40 mg via INTRAVENOUS

## 2021-11-25 MED ORDER — PROPOFOL 500 MG/50ML IV EMUL
INTRAVENOUS | Status: DC | PRN
  Administered 2021-11-25: 150 ug/kg/min via INTRAVENOUS

## 2021-11-25 MED ORDER — PROPOFOL 10 MG/ML IV BOLUS
INTRAVENOUS | Status: DC | PRN
  Administered 2021-11-25: 100 mg via INTRAVENOUS
  Administered 2021-11-25 (×2): 20 mg via INTRAVENOUS

## 2021-11-25 NOTE — Op Note (Signed)
[facility] Gastroenterology Patient Name: [name] Procedure Date: 11/25/2021 10:34 AM MRN: [id number] Account #: [id number] Date of Birth: [birth year] Admit Type: Outpatient Age: 41 Room: [facility] ENDO ROOM 4 Gender: Female Note Status: Finalized Instrument Name: Jasper Riling [id number] Procedure:             Colonoscopy Indications:           Rectal bleeding Providers:             Jonathon Bellows MD, MD Referring MD:          Elveria Rising. Damita Dunnings, MD (Referring MD) Medicines:             Monitored Anesthesia Care Complications:         No immediate complications. Procedure:             Pre-Anesthesia Assessment:                        - Prior to the procedure, a History and Physical was                         performed, and patient medications, allergies and                         sensitivities were reviewed. The patient's tolerance                         of previous anesthesia was reviewed.                        - The risks and benefits of the procedure and the                         sedation options and risks were discussed with the                         patient. All questions were answered and informed                         consent was obtained.                        - ASA Grade Assessment: II - A patient with mild                         systemic disease.                        After obtaining informed consent, the colonoscope was                         passed under direct vision. Throughout the procedure,                         the patient's blood pressure, pulse, and oxygen                         saturations were monitored continuously. The                         Colonoscope was introduced through the  anus and                         advanced to the the cecum, identified by the                         appendiceal orifice. The colonoscopy was performed                         with ease. The patient tolerated the procedure well.                          The quality of the bowel preparation was excellent. Findings:      The perianal and digital rectal examinations were normal.      Non-bleeding internal hemorrhoids were found during retroflexion. The       hemorrhoids were medium-sized and Grade I (internal hemorrhoids that do       not prolapse).      A 5 mm polyp was found in the rectum. The polyp was sessile. The polyp       was removed with a cold snare. Resection and retrieval were complete.      The exam was otherwise without abnormality on direct and retroflexion       views. Impression:            - Non-bleeding internal hemorrhoids.                        - One 5 mm polyp in the rectum, removed with a cold                         snare. Resected and retrieved.                        - The examination was otherwise normal on direct and                         retroflexion views. Recommendation:        - Discharge patient to home (with escort).                        - Resume previous diet.                        - Continue present medications.                        - Await pathology results.                        - Repeat colonoscopy for surveillance based on                         pathology results.                        - Return to GI office PRN. Procedure Code(s):     --- Professional ---                        [phone], Colonoscopy, flexible; with removal of  tumor(s), polyp(s), or other lesion(s) by snare                         technique Diagnosis Code(s):     --- Professional ---                        K62.1, Rectal polyp                        K64.0, First degree hemorrhoids                        K62.5, Hemorrhage of anus and rectum CPT copyright 2019 American Medical Association. All rights reserved. The codes documented in this report are preliminary and upon coder review may  be revised to meet current compliance requirements. Jonathon Bellows, MD Jonathon Bellows MD, MD 11/25/2021 11:01:14 AM This  report has been signed electronically. Number of Addenda: 0 Note Initiated On: 11/25/2021 10:34 AM Scope Withdrawal Time: 0 hours 7 minutes 45 seconds  Total Procedure Duration: 0 hours 13 minutes 19 seconds  Estimated Blood Loss:  Estimated blood loss: none.      [facility]

## 2021-11-25 NOTE — Transfer of Care (Signed)
Immediate Anesthesia Transfer of Care Note  Patient: [name]  Procedure(s) Performed: Procedure(s): COLONOSCOPY WITH PROPOFOL (N/A)  Patient Location: PACU and Endoscopy Unit  Anesthesia Type:General  Level of Consciousness: sedated  Airway & Oxygen Therapy: Patient Spontanous Breathing and Patient connected to nasal cannula oxygen  Post-op Assessment: Report given to RN and Post -op Vital signs reviewed and stable  Post vital signs: Reviewed and stable  Last Vitals:  Vitals:   11/25/21 1011 11/25/21 1102  BP: 127/87 102/75  Pulse: 80 77  Resp: 20 17  Temp: (!) 35.7 C (!) 35.6 C  SpO2: 163% 845%    Complications: No apparent anesthesia complications

## 2021-11-25 NOTE — Anesthesia Procedure Notes (Signed)
Date/Time: 11/25/2021 10:45 AM Performed by: Doreen Salvage, CRNA Pre-anesthesia Checklist: Patient identified, Emergency Drugs available, Suction available and Patient being monitored Patient Re-evaluated:Patient Re-evaluated prior to induction Oxygen Delivery Method: Nasal cannula Induction Type: IV induction Dental Injury: Teeth and Oropharynx as per pre-operative assessment  Comments: Nasal cannula with etCO2 monitoring

## 2021-11-25 NOTE — H&P (Signed)
Maria Bellows, MD [address] [place], [place], [place], [address] [address], [place], [place], [place], [address] Phone: [phone]  Fax: [phone]  Primary Care Physician:  Tonia Ghent, MD   Pre-Procedure History & Physical: HPI:  [name] is a 41 y.o. female is here for an colonoscopy.   Past Medical History:  Diagnosis Date   ADD (attention deficit disorder)    testing done 2016   ALLERGIC RHINITIS 10/11/2009   Allergy to latex 04/11/2010   ANKLE PAIN, RIGHT 04/03/2009   BACK PAIN 03/19/2009   COMMON MIGRAINE 03/19/2009   FLANK PAIN, RIGHT 05/11/2009   GLUCOSE INTOLERANCE 03/19/2009   HYDRONEPHROSIS, RIGHT 05/11/2009   INSOMNIA-SLEEP DISORDER-UNSPEC 03/19/2009   Left tibial fracture 2018   Migraine    NEPHROLITHIASIS, HX OF 03/19/2009   RENAL CALCULUS, RIGHT 05/11/2009    Past Surgical History:  Procedure Laterality Date   ABDOMINAL HYSTERECTOMY     APPENDECTOMY  1994   CYSTOSCOPY W/ URETERAL STENT PLACEMENT Left 09/24/2014   Procedure: CYSTOSCOPY WITH RETROGRADE PYELOGRAM/LEFT URETERAL STENT PLACEMENT;  Surgeon: Raynelle Bring, MD;  Location: [facility];  Service: Urology;  Laterality: Left;   EYE SURGERY Bilateral    lens replacement 2014   hx of etopic pregnancy  2005   LITHOTRIPSY  2011   OOPHORECTOMY  07/2008   s/p left knee arthroscopic  1994    Prior to Admission medications   Medication Sig Start Date End Date Taking? Authorizing Provider  calcium-vitamin D (OSCAL WITH D) 500-200 MG-UNIT tablet Take 1 tablet by mouth daily with breakfast.   Yes [provider]  Cholecalciferol (VITAMIN D3) 50 MCG (2000 UT) capsule Take 1 capsule (2,000 Units total) by mouth daily. 10/09/21  Yes Tonia Ghent, MD  linaclotide [facility]) 290 MCG CAPS capsule Take 1 capsule (290 mcg total) by mouth daily before breakfast. 11/21/21  Yes Maria Bellows, MD  tiZANidine (ZANAFLEX) 4 MG tablet TAKE 1 TABLET BY MOUTH EVERY 6 HOURS AS NEEDED 11/22/21  Yes  Tonia Ghent, MD  topiramate (TOPAMAX) 25 MG tablet PLEASE SEE ATTACHED FOR DETAILED DIRECTIONS 03/18/20  Yes [provider]  EPINEPHRINE 0.3 mg/0.3 mL IJ SOAJ injection INJECT 0.3 MLS INTO THE MUSCLE ONCE FOR 1 DOSE. 12/14/19   Tonia Ghent, MD  methylphenidate (RITALIN) 10 MG tablet Take 1.5-2 tablets (15-20 mg total) by mouth 2 (two) times daily. 10/09/21   Tonia Ghent, MD  omeprazole (PRILOSEC) 40 MG capsule TAKE 1 CAPSULE (40 MG TOTAL) BY MOUTH DAILY. Patient not taking: Reported on 11/25/2021 11/25/21   Maria Bellows, MD  prochlorperazine (COMPAZINE) 10 MG tablet Take by mouth. 04/20/20 09/03/21  [provider]  zolpidem (AMBIEN CR) 6.25 MG CR tablet Take 1 tablet (6.25 mg total) by mouth at bedtime as needed for sleep (for next refill, attempt to taper dose.). 09/03/21   Tonia Ghent, MD    Allergies as of 11/21/2021 - Review Complete 11/21/2021  Allergen Reaction Noted   Amoxicillin-pot clavulanate Swelling 03/19/2009   Latex  04/11/2010   Aspirin Other (See Comments) 03/19/2009   Eggs or egg-derived products Other (See Comments) 08/29/2015   Ibuprofen  03/19/2009   Nsaids  03/19/2009   Other Other (See Comments) 11/29/2014   Ozempic (0.25 or 0.5 mg-dose) [semaglutide(0.25 or 0.'5mg'$ -dos)]  06/11/2020   Red dye  11/21/2011    Family History  Problem Relation Age of Onset   Alcohol abuse Mother  ETOH   Bipolar disorder Mother        manic depressive/bipolar   Thyroid disease Mother        Low thyroid   Hypertension Father    Hypertension Other    Diabetes Other    Heart disease Other    Stroke Other    Cancer Other        Lung Cancer   Colon cancer Maternal Grandmother    Breast cancer Paternal Grandmother     Social History   Socioeconomic History   Marital status: Married    Spouse name: Not on file   Number of children: 0   Years of education: some college   Highest education level: Not on file  Occupational History    Occupation: Teacher, early years/pre  Tobacco Use   Smoking status: Former    Packs/day: 1.00    Years: 17.00    Pack years: 17.00    Types: Cigarettes    Quit date: 2017    Years since quitting: 6.3   Smokeless tobacco: Never  Vaping Use   Vaping Use: Never used  Substance and Sexual Activity   Alcohol use: Yes    Alcohol/week: 0.0 standard drinks    Comment: rare   Drug use: No   Sexual activity: Yes  Other Topics Concern   Not on file  Social History Narrative   Married to [employer]   Works at [employer], Teacher, early years/pre   Enjoys reading   No kids.   Right-handed.   1-2 cups caffeine per day   Social Determinants of Health   Financial Resource Strain: Not on file  Food Insecurity: Not on file  Transportation Needs: Not on file  Physical Activity: Not on file  Stress: Not on file  Social Connections: Not on file  Intimate Partner Violence: Not on file    Review of Systems: See HPI, otherwise negative ROS  Physical Exam: BP 127/87   Pulse 80   Temp (!) 96.2 F (35.7 C) (Temporal)   Resp 20   Ht '5\' 8"'$  (1.727 m)   Wt 79.4 kg   SpO2 100%   BMI 26.61 kg/m  General:   Alert,  pleasant and cooperative in NAD Head:  Normocephalic and atraumatic. Neck:  Supple; no masses or thyromegaly. Lungs:  Clear throughout to auscultation, normal respiratory effort.    Heart:  +S1, +S2, Regular rate and rhythm, No edema. Abdomen:  Soft, nontender and nondistended. Normal bowel sounds, without guarding, and without rebound.   Neurologic:  Alert and  oriented x4;  grossly normal neurologically.  Impression/Plan: [name] is here for an colonoscopy to be performed for rectal bleeding  Risks, benefits, limitations, and alternatives regarding  colonoscopy have been reviewed with the patient.  Questions have been answered.  All parties agreeable.   Maria Bellows, MD  11/25/2021, 10:32 AM \

## 2021-11-25 NOTE — Telephone Encounter (Signed)
yes

## 2021-11-25 NOTE — Anesthesia Postprocedure Evaluation (Signed)
Anesthesia Post Note  Patient: [name]  Procedure(s) Performed: COLONOSCOPY WITH PROPOFOL  Patient location during evaluation: PACU Anesthesia Type: General Level of consciousness: oriented Pain management: satisfactory to patient Vital Signs Assessment: post-procedure vital signs reviewed and stable Respiratory status: spontaneous breathing and respiratory function stable Cardiovascular status: stable Anesthetic complications: no   No notable events documented.   Last Vitals:  Vitals:   11/25/21 1112 11/25/21 1122  BP: 112/79 123/69  Pulse: 73 68  Resp: 13 13  Temp:    SpO2: 99% 100%    Last Pain:  Vitals:   11/25/21 1122  TempSrc:   PainSc: 0-No pain                 VAN STAVEREN,Makail Watling

## 2021-11-25 NOTE — Anesthesia Preprocedure Evaluation (Signed)
Anesthesia Evaluation  Patient identified by MRN, date of birth, ID band Patient awake    Reviewed: Allergy & Precautions, NPO status , Patient's Chart, lab work & pertinent test results  Airway Mallampati: II  TM Distance: >3 FB Neck ROM: Full    Dental  (+) Teeth Intact   Pulmonary neg pulmonary ROS, sleep apnea and Continuous Positive Airway Pressure Ventilation , former smoker,    Pulmonary exam normal breath sounds clear to auscultation       Cardiovascular Exercise Tolerance: Good negative cardio ROS Normal cardiovascular exam Rhythm:Regular     Neuro/Psych  Headaches, Anxiety negative neurological ROS  negative psych ROS   GI/Hepatic negative GI ROS, Neg liver ROS,   Endo/Other  negative endocrine ROS  Renal/GU negative Renal ROS  negative genitourinary   Musculoskeletal negative musculoskeletal ROS (+)   Abdominal Normal abdominal exam  (+)   Peds negative pediatric ROS (+)  Hematology negative hematology ROS (+)   Anesthesia Other Findings Past Medical History: No date: ADD (attention deficit disorder)     Comment:  testing done 2016 10/11/2009: ALLERGIC RHINITIS 04/11/2010: Allergy to latex 04/03/2009: ANKLE PAIN, RIGHT 03/19/2009: BACK PAIN 03/19/2009: COMMON MIGRAINE 05/11/2009: FLANK PAIN, RIGHT 03/19/2009: GLUCOSE INTOLERANCE 05/11/2009: HYDRONEPHROSIS, RIGHT 03/19/2009: INSOMNIA-SLEEP DISORDER-UNSPEC 2018: Left tibial fracture No date: Migraine 03/19/2009: NEPHROLITHIASIS, HX OF 05/11/2009: RENAL CALCULUS, RIGHT  Past Surgical History: No date: ABDOMINAL HYSTERECTOMY 1994: APPENDECTOMY 09/24/2014: CYSTOSCOPY W/ URETERAL STENT PLACEMENT; Left     Comment:  Procedure: CYSTOSCOPY WITH RETROGRADE PYELOGRAM/LEFT               URETERAL STENT PLACEMENT;  Surgeon: Raynelle Bring, MD;                Location: [facility];  Service: Urology;  Laterality: Left; No date: EYE SURGERY; Bilateral     Comment:  lens  replacement 2014 2005: hx of etopic pregnancy 2011: LITHOTRIPSY 07/2008: OOPHORECTOMY 1994: s/p left knee arthroscopic  BMI    Body Mass Index: 26.61 kg/m      Reproductive/Obstetrics negative OB ROS                             Anesthesia Physical Anesthesia Plan  ASA: 2  Anesthesia Plan: General   Post-op Pain Management:    Induction: Intravenous  PONV Risk Score and Plan: Propofol infusion and TIVA  Airway Management Planned: Natural Airway  Additional Equipment:   Intra-op Plan:   Post-operative Plan:   Informed Consent: I have reviewed the patients History and Physical, chart, labs and discussed the procedure including the risks, benefits and alternatives for the proposed anesthesia with the patient or authorized representative who has indicated his/her understanding and acceptance.     Dental Advisory Given  Plan Discussed with: CRNA and Surgeon  Anesthesia Plan Comments:         Anesthesia Quick Evaluation

## 2021-11-26 ENCOUNTER — Encounter: Payer: Self-pay | Admitting: Gastroenterology

## 2021-11-26 LAB — SURGICAL PATHOLOGY

## 2021-11-28 ENCOUNTER — Telehealth: Payer: Self-pay | Admitting: Gastroenterology

## 2021-11-28 NOTE — Telephone Encounter (Signed)
Patient called stating she is in a lot of pain and has some growing hemorrhoids after colonoscopy. Requesting a call back. Also mentioned she has left a vm.

## 2021-11-29 ENCOUNTER — Telehealth: Payer: Self-pay | Admitting: Nurse Practitioner

## 2021-11-29 ENCOUNTER — Ambulatory Visit: Payer: 59 | Admitting: Nurse Practitioner

## 2021-11-29 VITALS — BP 130/74 | HR 68 | Temp 97.4°F | Resp 12 | Wt 177.1 lb

## 2021-11-29 DIAGNOSIS — K641 Second degree hemorrhoids: Secondary | ICD-10-CM

## 2021-11-29 MED ORDER — PROCTOFOAM HC 1-1 % EX FOAM: 1.0000 | Freq: Two times a day (BID) | CUTANEOUS | 0 refills | Status: AC

## 2021-11-29 NOTE — Assessment & Plan Note (Signed)
Patient experiencing discomfort from hemorrhoids.  3 hemorrhoids on exam.  None seem to be thrombosed in office.  We will send in some Proctofoam the patient can use twice daily over the next week.  Did give information in regards to hemorrhoid management at discharge patient also used witch hazel wipes if beneficial to soothe the bottom.  Patient does have an appointment on May 30 with her GI doctor to discuss banding

## 2021-11-29 NOTE — Telephone Encounter (Signed)
I spoke with pt; she said she called GI but was told they close today at 12 noon and it will be next wk before pt gets cb and if need something before then to call PCP or go UC. Pt said on 11/25/20 had colonoscopy and a polyp was removed so pt was sore for couple of days; no soreness inside now. On 11/27/21 after normal BM pt said 2 hemorrhoids came out; one is larger the size of a dime and a smaller one. Pain level now is 3 - 4 but pt is to leave this afternoon on 3 hr car ride and to return on 12/01/21 and pt knows hemorrhoid is going to bother her the entire trip. Pt said has not seen any blood, no fever, no abd pain. Pt is having normal BMS. Pt said she is scheduled for appt in few wks for banding hemorrhoid but cannot wait due to hemorrhoid coming out and the pain.pt plans on keeping appt today at 3PM with Romilda Garret NP. UC & ED precautions given and pt voiced understanding. Sending note to Romilda Garret NP.

## 2021-11-29 NOTE — Telephone Encounter (Signed)
Noted will see and evaluate patient in office

## 2021-11-29 NOTE — Telephone Encounter (Signed)
Please triage. C/o of follow up after colonoscopy. Saw she called the GI office yesterday

## 2021-11-29 NOTE — Progress Notes (Signed)
   Acute Office Visit  Subjective:     Patient ID: [name], female    DOB: 11-08-1980, 41 y.o.   MRN: [id number]  Chief Complaint  Patient presents with   Hemorrhoids    Had colonoscopy on 11/25/21-has had soreness internal since then, then noticed 11/27/21 external hemorrhoids now and pain got worse.    HPI Patient is in today for rectal pain   States that she had a colonoscopy Sore Wednesday she had a hemorrhoid appear and then another appeared. Has been using preparation h with lidocaine without much relief.  Pain, but no bleeding itching stinging or burning.Pain is described as all the time, more so when she is sitting. Is having BM and passing gas. No hard stools per patient report. Not having to strain for the BMs.   Review of Systems  Constitutional:  Negative for chills and fever.  Gastrointestinal:  Negative for abdominal pain, blood in stool, constipation, diarrhea, nausea and vomiting.  Genitourinary:  Negative for dysuria and hematuria.       Objective:    BP 130/74   Pulse 68   Temp (!) 97.4 F (36.3 C)   Resp 12   Wt 177 lb 1 oz (80.3 kg)   BMI 26.92 kg/m    Physical Exam Vitals and nursing note reviewed. Exam conducted with a chaperone present Beatriz Stallion, CMA).  Cardiovascular:     Rate and Rhythm: Normal rate and regular rhythm.     Heart sounds: Normal heart sounds.  Pulmonary:     Effort: Pulmonary effort is normal.     Breath sounds: Normal breath sounds.  Abdominal:     General: Bowel sounds are normal. There is no distension.     Palpations: There is no mass.     Tenderness: There is no abdominal tenderness.     Hernia: No hernia is present.  Genitourinary:     No results found for any visits on 11/29/21.      Assessment & Plan:   Problem List Items Addressed This Visit       Cardiovascular and Mediastinum   Grade II hemorrhoids - Primary    Patient experiencing discomfort from hemorrhoids.  3 hemorrhoids on  exam.  None seem to be thrombosed in office.  We will send in some Proctofoam the patient can use twice daily over the next week.  Did give information in regards to hemorrhoid management at discharge patient also used witch hazel wipes if beneficial to soothe the bottom.  Patient does have an appointment on May 30 with her GI doctor to discuss banding       Relevant Medications   hydrocortisone-pramoxine (PROCTOFOAM HC) rectal foam    Meds ordered this encounter  Medications   hydrocortisone-pramoxine (PROCTOFOAM HC) rectal foam    Sig: Place 1 applicator rectally 2 (two) times daily.    Dispense:  10 g    Refill:  0    Order Specific Question:   Supervising Provider    Answer:   TOWER, MARNE A [[id number]]    No follow-ups on file.  Romilda Garret, NP

## 2021-11-29 NOTE — Patient Instructions (Addendum)
Nice to see you today I sent in a foam you can use on your bottom to help with the pain and swelling  Can also use witch hazel pads

## 2021-11-29 NOTE — Telephone Encounter (Signed)
Called patient back and she stated that she started to have rectal pain due to external hemorrhoids. Patient stated that she had done sitz baths and preparation H and it has not helped. She also stated that Dr. Vicente Males wanted her to come in and possibly have her hemorrhoids banded. I told her that she could come in on 12/03/2021 at 3:15 PM so he could take a look at her hemorrhoids and decide if they could get banded. Patient agreed. I also recommended for her to eat a high fiber diet in the meantime and she agreed.

## 2021-11-30 ENCOUNTER — Other Ambulatory Visit: Payer: Self-pay | Admitting: Family Medicine

## 2021-12-03 ENCOUNTER — Encounter: Payer: Self-pay | Admitting: Gastroenterology

## 2021-12-03 ENCOUNTER — Other Ambulatory Visit: Payer: Self-pay | Admitting: Family Medicine

## 2021-12-03 ENCOUNTER — Ambulatory Visit: Payer: 59 | Admitting: Gastroenterology

## 2021-12-03 VITALS — BP 119/87 | HR 82 | Temp 98.9°F | Ht 67.0 in | Wt 179.0 lb

## 2021-12-03 DIAGNOSIS — K648 Other hemorrhoids: Secondary | ICD-10-CM

## 2021-12-03 NOTE — Progress Notes (Signed)
Jonathon Bellows MD, MRCP(U.K) [address]  [place]  [place], [place] [address]  Main: [phone]  Fax: [phone]   Primary Care Physician: Tonia Ghent, MD  Primary Gastroenterologist:  Dr. Jonathon Bellows   Chief Complaint  Patient presents with   banding #1    HPI: [name] is a 41 y.o. female   Initially referred and seen for pancreatitis.  She present to the emergency room with left-sided abdominal pain of 2 days duration in December 2022.  CT scan of the abdomen showed thickening of the sigmoid colon and rectum suggestive of proctocolitis.  Treated for viral enteritis.  Concern for pancreatitis lipase is elevated at 248 but CT scan showed no evidence of pancreatitis at her initial visit in May 2023 had some lower abdominal pain when she eats greasy foods for a few hours radiates to her left flank and was having constipation, did not have a bowel movement for at least 3 days in a row, associated with some rectal bleeding with blood on the tissue paper.  11/25/2021: Colonoscopy medium size grade 1 hemorrhoids were seen.  5 mm polyp in the rectum was resected.  The polyp was hyperplastic. She subsequently was seen by Dr. Charmian Muff on 11/29/2021  Since the colonoscopy he has had issues with the hemorrhoids popping out and causing discomfort and they go back and afterwards.  Denies any pain.  No rectal bleeding.  Would like to get her hemorrhoids banded.  Failed conservative management.   PROCEDURE NOTE: The patient presents with symptomatic grade 2 hemorrhoids, unresponsive to maximal medical therapy, requesting rubber band ligation of his/her hemorrhoidal disease.  All risks, benefits and alternative forms of therapy were described and informed consent was obtained.  In the Left Lateral Decubitus position (if anoscopy is performed) anoscopic examination revealed grade 2 hemorrhoids in the all position(s).   The decision was made to band the LL internal hemorrhoid,  and the [place] was used to perform band ligation without complication.  Digital anorectal examination was then performed to assure proper positioning of the band, and to adjust the banded tissue as required.  The patient was discharged home without pain or other issues.  Dietary and behavioral recommendations were given and (if necessary - prescriptions were given), along with follow-up instructions.  The patient will return 4 weeks for follow-up and possible additional banding as required.  No complications were encountered and the patient tolerated the procedure well.   Current Outpatient Medications  Medication Sig Dispense Refill   calcium-vitamin D (OSCAL WITH D) 500-200 MG-UNIT tablet Take 1 tablet by mouth daily with breakfast.     Cholecalciferol (VITAMIN D3) 50 MCG (2000 UT) capsule Take 1 capsule (2,000 Units total) by mouth daily.     EPINEPHRINE 0.3 mg/0.3 mL IJ SOAJ injection INJECT 0.3 MLS INTO THE MUSCLE ONCE FOR 1 DOSE. 2 each 1   esomeprazole (NEXIUM) 40 MG packet Take 40 mg by mouth daily before breakfast. 90 each 1   hydrocortisone-pramoxine (PROCTOFOAM HC) rectal foam Place 1 applicator rectally 2 (two) times daily. 10 g 0   linaclotide (LINZESS) 290 MCG CAPS capsule Take 1 capsule (290 mcg total) by mouth daily before breakfast. 12 capsule 0   methylphenidate (RITALIN) 10 MG tablet Take 1.5-2 tablets (15-20 mg total) by mouth 2 (two) times daily. 120 tablet 0   prochlorperazine (COMPAZINE) 10 MG tablet Take by mouth.     tiZANidine (ZANAFLEX) 4 MG tablet TAKE 1 TABLET BY  MOUTH EVERY 6 HOURS AS NEEDED 60 tablet 1   topiramate (TOPAMAX) 100 MG tablet Take 100 mg by mouth daily.     zolpidem (AMBIEN CR) 6.25 MG CR tablet Take 1 tablet (6.25 mg total) by mouth at bedtime as needed for sleep (for next refill, attempt to taper dose.). 30 tablet 1   No current facility-administered medications for this visit.    Allergies as of 12/03/2021 - Review Complete 12/03/2021   Allergen Reaction Noted   Amoxicillin-pot clavulanate Swelling 03/19/2009   Latex  04/11/2010   Aspirin Other (See Comments) 03/19/2009   Eggs or egg-derived products Other (See Comments) 08/29/2015   Ibuprofen  03/19/2009   Nsaids  03/19/2009   Other Other (See Comments) 11/29/2014   Ozempic (0.25 or 0.5 mg-dose) [semaglutide(0.25 or 0.'5mg'$ -dos)]  06/11/2020   Red dye  11/21/2011    ROS:  General: Negative for anorexia, weight loss, fever, chills, fatigue, weakness. ENT: Negative for hoarseness, difficulty swallowing , nasal congestion. CV: Negative for chest pain, angina, palpitations, dyspnea on exertion, peripheral edema.  Respiratory: Negative for dyspnea at rest, dyspnea on exertion, cough, sputum, wheezing.  GI: See history of present illness. GU:  Negative for dysuria, hematuria, urinary incontinence, urinary frequency, nocturnal urination.  Endo: Negative for unusual weight change.    Physical Examination:   There were no vitals taken for this visit.    Imaging Studies: No results found.  Assessment and Plan:   [name] is a 41 y.o. y/o female here today to have her internal hemorrhoids banded due to prolapsing and causing discomfort.  Banded the left lateral column she will return in 4 weeks for the next round of treatment.  She has failed conservative management    Dr Jonathon Bellows  MD,MRCP [facility]) Follow up in 4 weeks

## 2021-12-03 NOTE — Telephone Encounter (Signed)
Refill request for ZOLPIDEM TART ER 6.25 MG TAB  LOV - 11/29/21 Next OV - not scheduled Last refill - 09/03/21 #30/1

## 2021-12-04 NOTE — Telephone Encounter (Signed)
Sent. Thanks.   

## 2021-12-08 ENCOUNTER — Other Ambulatory Visit: Payer: Self-pay | Admitting: Family Medicine

## 2021-12-09 NOTE — Telephone Encounter (Signed)
Refill request for methylphenidate (RITALIN) 10 MG tablet  LOV - 11/29/21 Next OV - not scheduled Last refill - 10/09/21 #120/0

## 2021-12-10 MED ORDER — METHYLPHENIDATE HCL 10 MG PO TABS
15.0000 mg | ORAL_TABLET | Freq: Two times a day (BID) | ORAL | 0 refills | Status: DC
Start: 2021-12-10 — End: 2022-02-18

## 2021-12-16 ENCOUNTER — Other Ambulatory Visit: Payer: Self-pay | Admitting: Family Medicine

## 2021-12-23 ENCOUNTER — Ambulatory Visit: Payer: 59 | Admitting: Gastroenterology

## 2021-12-27 ENCOUNTER — Ambulatory Visit
Admission: RE | Admit: 2021-12-27 | Discharge: 2021-12-27 | Disposition: A | Discharge status: Home / Self Care | Payer: 59 | Source: Ambulatory Visit | Attending: Emergency Medicine | Admitting: Emergency Medicine

## 2021-12-27 VITALS — BP 149/91 | HR 82 | Temp 99.1°F | Resp 18

## 2021-12-27 DIAGNOSIS — J069 Acute upper respiratory infection, unspecified: Secondary | ICD-10-CM | POA: Diagnosis not present

## 2021-12-27 MED ORDER — BENZONATATE 100 MG PO CAPS: 100.0000 mg | ORAL_CAPSULE | Freq: Three times a day (TID) | ORAL | 0 refills | Status: DC

## 2021-12-27 MED ORDER — PROMETHAZINE-DM 6.25-15 MG/5ML PO SYRP: 5.0000 mL | ORAL_SOLUTION | Freq: Four times a day (QID) | ORAL | 0 refills | Status: DC | PRN

## 2021-12-27 MED ORDER — DOXYCYCLINE HYCLATE 100 MG PO CAPS: 100.0000 mg | ORAL_CAPSULE | Freq: Two times a day (BID) | ORAL | 0 refills | Status: DC

## 2022-01-02 ENCOUNTER — Encounter: Payer: Self-pay | Admitting: Gastroenterology

## 2022-01-02 ENCOUNTER — Ambulatory Visit: Payer: 59 | Admitting: Gastroenterology

## 2022-01-02 VITALS — BP 122/84 | HR 88 | Temp 99.1°F | Wt 180.6 lb

## 2022-01-02 DIAGNOSIS — K648 Other hemorrhoids: Secondary | ICD-10-CM

## 2022-01-02 NOTE — Progress Notes (Signed)
Patient follow-ups today for banding of hemorrhoids    Summary of history :  Initially referred and seen for pancreatitis.  She present to the emergency room with left-sided abdominal pain of 2 days duration in December 2022.  CT scan of the abdomen showed thickening of the sigmoid colon and rectum suggestive of proctocolitis.  Treated for viral enteritis.  Concern for pancreatitis lipase is elevated at 248 but CT scan showed no evidence of pancreatitis at her initial visit in May 2023 had some lower abdominal pain when she eats greasy foods for a few hours radiates to her left flank and was having constipation, did not have a bowel movement for at least 3 days in a row, associated with some rectal bleeding with blood on the tissue paper.  11/25/2021: Colonoscopy medium size grade 1 hemorrhoids were seen.  5 mm polyp in the rectum was resected.  The polyp was hyperplastic. She subsequently was seen by Dr. Charmian Muff on 11/29/2021   Since the colonoscopy she has had issues with the hemorrhoids popping out and causing discomfort and they go back and afterwards.  Denies any pain.  No rectal bleeding.  Would like to get her hemorrhoids banded.  Failed conservative management.    First round: 12/03/2021: Left lateral column banded    Interval history 12/03/2021-01/02/2022  No complaints after initial [place] feels better no sensation of anything popping out no bleeding no pain  Digital rectal exam performed in the presence of a chaperone. External anal findings: Normal Internal findings: , No masses, no blood on glove noticed.    PROCEDURE NOTE: The patient presents with symptomatic grade 2 hemorrhoids, unresponsive to maximal medical therapy, requesting rubber band ligation of his/her hemorrhoidal disease.  All risks, benefits and alternative forms of therapy were described and informed consent was obtained.  In the Left Lateral Decubitus position (if anoscopy is performed) anoscopic examination  revealed grade 1 hemorrhoids in the RA and RP position(s).   The decision was made to band the RA internal hemorrhoid, and the [place] was used to perform band ligation without complication.  Digital anorectal examination was then performed to assure proper positioning of the band, and to adjust the banded tissue as required.  The patient was discharged home without pain or other issues.  Dietary and behavioral recommendations were given and (if necessary - prescriptions were given), along with follow-up instructions.  The patient will return 4 weeks for follow-up and possible additional banding as required.  No complications were encountered and the patient tolerated the procedure well.   Plan:  Avoid constipation.  Commence on stool softeners if not already on  Follow-up: 4 weeks  Dr Jonathon Bellows MD,MRCP [facility]) Gastroenterology/Hepatology Pager: [phone]

## 2022-01-03 ENCOUNTER — Other Ambulatory Visit: Payer: Self-pay | Admitting: Family Medicine

## 2022-01-17 ENCOUNTER — Other Ambulatory Visit: Payer: Self-pay | Admitting: Family Medicine

## 2022-01-28 ENCOUNTER — Ambulatory Visit: Payer: 59 | Admitting: Gastroenterology

## 2022-01-28 NOTE — Progress Notes (Deleted)
Patient follow-ups today for banding of hemorrhoids    Summary of history :  11/25/2021: Colonoscopy medium size grade 1 hemorrhoids were seen.  5 mm polyp in the rectum was resected.  The polyp was hyperplastic. She subsequently was seen by Dr. Charmian Muff on 11/29/2021   Since the colonoscopy she has had issues with the hemorrhoids popping out and causing discomfort and they go back and afterwards.  Denies any pain.  No rectal bleeding.  Would like to get her hemorrhoids banded.  Failed conservative management    First round:12/03/2021: LL column banded  Second round:01/02/2022: RA column banded   Interval history  01/02/2022-01/28/2022    Digital rectal exam performed in the presence of a chaperone. External anal findings: *** Internal findings:*** , No masses, no blood on glove noticed.    PROCEDURE NOTE: The patient presents with symptomatic grade {1-4:31454} hemorrhoids, unresponsive to maximal medical therapy, requesting rubber band ligation of his/her hemorrhoidal disease.  All risks, benefits and alternative forms of therapy were described and informed consent was obtained.  In the Left Lateral Decubitus position (if anoscopy is performed) anoscopic examination revealed grade {1-4:31454} hemorrhoids in the {CHL AMB HEMORRHOID POSITION:210130901} position(s).   The decision was made to band the {CHL AMB HEMORRHOID POSITION:210130901} internal hemorrhoid, and the [place] was used to perform band ligation without complication.  Digital anorectal examination was then performed to assure proper positioning of the band, and to adjust the banded tissue as required.  The patient was discharged home without pain or other issues.  Dietary and behavioral recommendations were given and (if necessary - prescriptions were given), along with follow-up instructions.  The patient will return {1-4:31454} {CHL AMB WEEKS/MONTHS/AS NEEDED:210130900} for follow-up and possible additional banding as  required.  No complications were encountered and the patient tolerated the procedure well.   Plan:  Avoid constipation.  Commence on stool softeners if not already on  Follow-up:***  Dr Jonathon Bellows MD,MRCP [facility]) Gastroenterology/Hepatology Pager: [phone]

## 2022-02-18 ENCOUNTER — Encounter: Payer: Self-pay | Admitting: Family Medicine

## 2022-02-18 ENCOUNTER — Other Ambulatory Visit: Payer: Self-pay | Admitting: Family Medicine

## 2022-02-18 NOTE — Telephone Encounter (Signed)
Refill request for methylphenidate (RITALIN) 10 MG tablet  LOV - 11/29/21 Next OV - not scheduled Last refill - 12/10/21 #120/0

## 2022-02-19 MED ORDER — METHYLPHENIDATE HCL 10 MG PO TABS: 15.0000 mg | ORAL_TABLET | Freq: Two times a day (BID) | ORAL | 0 refills | Status: DC

## 2022-02-24 NOTE — Telephone Encounter (Signed)
Pt brought by a parking placard form to be filled out by pcp. Pt stated she's leaving this Friday, the 25th, going out of town so she wanted to know if the form could be completed by then? Requested a call back at [phone]. Form will be in pcp's folder.

## 2022-02-25 ENCOUNTER — Other Ambulatory Visit: Payer: Self-pay | Admitting: Family Medicine

## 2022-02-26 NOTE — Telephone Encounter (Signed)
Done. Thanks.

## 2022-02-26 NOTE — Telephone Encounter (Signed)
Refill request for Ambien 6.25 mg tab  LOV - 11/29/21 Next OV - not scheduled Last refill - 12/04/21 #30/1

## 2022-04-23 ENCOUNTER — Other Ambulatory Visit: Payer: Self-pay | Admitting: Family Medicine

## 2022-04-23 MED ORDER — METHYLPHENIDATE HCL 10 MG PO TABS: 15.0000 mg | ORAL_TABLET | Freq: Two times a day (BID) | ORAL | 0 refills | Status: DC

## 2022-04-23 NOTE — Telephone Encounter (Signed)
Refill request for methylphenidate (RITALIN) 10 MG tablet  LOV - 11/29/21 Next OV - not scheduled Last refill - 02/19/22 #120/0

## 2022-06-26 ENCOUNTER — Other Ambulatory Visit: Payer: Self-pay | Admitting: Family Medicine

## 2022-06-26 NOTE — Telephone Encounter (Signed)
Patient will be due for CPE after 2/28. Please call patient to schedule appt

## 2022-06-26 NOTE — Telephone Encounter (Signed)
Lvmtcb, sent mychart message  

## 2022-07-18 ENCOUNTER — Other Ambulatory Visit: Payer: Self-pay | Admitting: Family Medicine

## 2022-07-19 MED ORDER — METHYLPHENIDATE HCL 10 MG PO TABS: 15.0000 mg | ORAL_TABLET | Freq: Two times a day (BID) | ORAL | 0 refills | Status: DC

## 2022-07-19 NOTE — Telephone Encounter (Signed)
Sent. Thanks.   

## 2022-08-30 ENCOUNTER — Other Ambulatory Visit: Payer: Self-pay

## 2022-08-30 ENCOUNTER — Emergency Department (HOSPITAL_COMMUNITY): Payer: 59

## 2022-08-30 ENCOUNTER — Encounter (HOSPITAL_COMMUNITY): Payer: Self-pay

## 2022-08-30 ENCOUNTER — Emergency Department (HOSPITAL_COMMUNITY)
Admission: EM | Admit: 2022-08-30 | Discharge: 2022-08-30 | Disposition: A | Discharge status: Home / Self Care | Payer: 59 | Attending: Emergency Medicine | Admitting: Emergency Medicine

## 2022-08-30 DIAGNOSIS — G43809 Other migraine, not intractable, without status migrainosus: Secondary | ICD-10-CM | POA: Diagnosis not present

## 2022-08-30 DIAGNOSIS — Z91040 Latex allergy status: Secondary | ICD-10-CM | POA: Insufficient documentation

## 2022-08-30 DIAGNOSIS — R519 Headache, unspecified: Secondary | ICD-10-CM | POA: Diagnosis present

## 2022-08-30 LAB — CBC WITH DIFFERENTIAL/PLATELET
Abs Immature Granulocytes: 0.02 10*3/uL (ref 0.00–0.07)
Basophils Absolute: 0.1 10*3/uL (ref 0.0–0.1)
Basophils Relative: 1 %
Eosinophils Absolute: 0.2 10*3/uL (ref 0.0–0.5)
Eosinophils Relative: 2 %
HCT: 46.4 % — ABNORMAL HIGH (ref 36.0–46.0)
Hemoglobin: 15.3 g/dL — ABNORMAL HIGH (ref 12.0–15.0)
Immature Granulocytes: 0 %
Lymphocytes Relative: 37 %
Lymphs Abs: 3.6 10*3/uL (ref 0.7–4.0)
MCH: 28.5 pg (ref 26.0–34.0)
MCHC: 33 g/dL (ref 30.0–36.0)
MCV: 86.6 fL (ref 80.0–100.0)
Monocytes Absolute: 0.8 10*3/uL (ref 0.1–1.0)
Monocytes Relative: 8 %
Neutro Abs: 5 10*3/uL (ref 1.7–7.7)
Neutrophils Relative %: 52 %
Platelets: 396 10*3/uL (ref 150–400)
RBC: 5.36 MIL/uL — ABNORMAL HIGH (ref 3.87–5.11)
RDW: 12.5 % (ref 11.5–15.5)
WBC: 9.6 10*3/uL (ref 4.0–10.5)
nRBC: 0 % (ref 0.0–0.2)

## 2022-08-30 LAB — BASIC METABOLIC PANEL
Anion gap: 8 (ref 5–15)
BUN: 19 mg/dL (ref 6–20)
CO2: 26 mmol/L (ref 22–32)
Calcium: 9.6 mg/dL (ref 8.9–10.3)
Chloride: 104 mmol/L (ref 98–111)
Creatinine, Ser: 1.05 mg/dL — ABNORMAL HIGH (ref 0.44–1.00)
GFR, Estimated: >60 mL/min (ref >60)
Glucose, Bld: 107 mg/dL — ABNORMAL HIGH (ref 70–99)
Potassium: 3.6 mmol/L (ref 3.5–5.1)
Sodium: 138 mmol/L (ref 135–145)

## 2022-08-30 LAB — I-STAT BETA HCG BLOOD, ED (MC, WL, AP ONLY): I-stat hCG, quantitative: <5 m[IU]/mL (ref <5)

## 2022-08-30 MED ORDER — METOCLOPRAMIDE HCL 5 MG/ML IJ SOLN: 10.0000 mg | Freq: Once | INTRAMUSCULAR | Status: DC

## 2022-08-30 MED ORDER — DEXAMETHASONE SODIUM PHOSPHATE 4 MG/ML IJ SOLN
4.0000 mg | Freq: Once | INTRAMUSCULAR | Status: AC
  Administered 2022-08-30: 4 mg via INTRAVENOUS
  Filled 2022-08-30: qty 1

## 2022-08-30 MED ORDER — METOCLOPRAMIDE HCL 5 MG/ML IJ SOLN
10.0000 mg | Freq: Once | INTRAMUSCULAR | Status: AC
  Administered 2022-08-30: 10 mg via INTRAVENOUS
  Filled 2022-08-30: qty 2

## 2022-08-30 MED ORDER — DIPHENHYDRAMINE HCL 50 MG/ML IJ SOLN
25.0000 mg | Freq: Once | INTRAMUSCULAR | Status: AC
Start: 2022-08-30 — End: 2022-08-30
  Administered 2022-08-30: 25 mg via INTRAVENOUS
  Filled 2022-08-30: qty 1

## 2022-08-30 MED ORDER — PROCHLORPERAZINE EDISYLATE 10 MG/2ML IJ SOLN
10.0000 mg | Freq: Once | INTRAMUSCULAR | Status: AC
  Administered 2022-08-30: 10 mg via INTRAVENOUS
  Filled 2022-08-30: qty 2

## 2022-08-30 NOTE — ED Provider Notes (Signed)
[facility] Provider Note   CSN: [id number] Arrival date & time: 08/30/22  1123     History  Chief Complaint  Patient presents with   Migraine    [name] is a 42 y.o. female.   Migraine     Patient with medical history of migraines presents to the emergency department due to headache.  Started 3 days ago, came on with a sudden onset unilaterally to the left side of her head worse behind her left eye.  Denies any vision changes but is associated with photophobia and nausea although no emesis.  She has tried abortive therapy with Imitrex, Compazine and Tylenol with no improvement.  These have worked previously in the past.  She denies any lateralized weakness or numbness but when the headache initially started she did have numbness to the face bilaterally which lasted a few minutes and then resolved independently.  Has not recurred since then.  No recent fevers, chills, neck stiffness or pain.  Home Medications Prior to Admission medications   Medication Sig Start Date End Date Taking? Authorizing Provider  benzonatate (TESSALON) 100 MG capsule Take 1 capsule (100 mg total) by mouth every 8 (eight) hours. 12/27/21   White, Leitha Schuller, NP  calcium-vitamin D (OSCAL WITH D) 500-200 MG-UNIT tablet Take 1 tablet by mouth daily with breakfast.    [provider]  Cholecalciferol (VITAMIN D3) 50 MCG (2000 UT) capsule Take 1 capsule (2,000 Units total) by mouth daily. 10/09/21   Tonia Ghent, MD  doxycycline (VIBRAMYCIN) 100 MG capsule Take 1 capsule (100 mg total) by mouth 2 (two) times daily. 12/27/21   White, Leitha Schuller, NP  EPINEPHRINE 0.3 mg/0.3 mL IJ SOAJ injection INJECT 0.3 MLS INTO THE MUSCLE ONCE FOR 1 DOSE. 12/14/19   Tonia Ghent, MD  esomeprazole (NEXIUM) 40 MG packet Take 40 mg by mouth daily before breakfast. 11/25/21   Jonathon Bellows, MD  hydrocortisone-pramoxine (PROCTOFOAM [facility]) rectal foam Place 1 applicator  rectally 2 (two) times daily. 11/29/21   Michela Pitcher, NP  linaclotide Rolan Lipa) 290 MCG CAPS capsule Take 1 capsule (290 mcg total) by mouth daily before breakfast. 11/21/21   Jonathon Bellows, MD  methylphenidate (RITALIN) 10 MG tablet Take 1.5-2 tablets (15-20 mg total) by mouth 2 (two) times daily. 07/19/22   Tonia Ghent, MD  prochlorperazine (COMPAZINE) 10 MG tablet Take by mouth. 04/20/20 01/02/22  [provider]  promethazine-dextromethorphan (PROMETHAZINE-DM) 6.25-15 MG/5ML syrup Take 5 mLs by mouth 4 (four) times daily as needed for cough. 12/27/21   White, Leitha Schuller, NP  tiZANidine (ZANAFLEX) 4 MG tablet TAKE 1 TABLET BY MOUTH EVERY 6 HOURS AS NEEDED 06/26/22   Tonia Ghent, MD  topiramate (TOPAMAX) 100 MG tablet Take 100 mg by mouth daily. 11/28/21   [provider]  zolpidem (AMBIEN CR) 6.25 MG CR tablet TAKE 1 TABLET (6.25 MG TOTAL) BY MOUTH AT BEDTIME AS NEEDED FOR SLEEP (FOR NEXT REFILL, ATTEMPT TO TAPER DOSE.). 02/26/22   Tonia Ghent, MD      Allergies    Amoxicillin-pot clavulanate, Latex, Aspirin, Eggs or egg-derived products, Ibuprofen, Nsaids, Other, Ozempic (0.25 or 0.5 mg-dose) [semaglutide(0.25 or 0.'5mg'$ -dos)], Penicillins, and Red dye    Review of Systems   Review of Systems  Physical Exam Updated Vital Signs BP (!) 153/91 (BP Location: Left Arm)   Pulse 87   Temp 98.1 F (36.7 C) (Oral)   Resp 19   Ht  $'5\' 8"'S$  (1.727 m)   Wt 83.9 kg   SpO2 100%   BMI 28.13 kg/m  Physical Exam Vitals and nursing note reviewed. Exam conducted with a chaperone present.  Constitutional:      Appearance: Normal appearance.  HENT:     Head: Normocephalic and atraumatic.  Eyes:     General: No scleral icterus.       Right eye: No discharge.        Left eye: No discharge.     Extraocular Movements: Extraocular movements intact.     Pupils: Pupils are equal, round, and reactive to light.  Neck:     Comments: No nuchal rigidity Cardiovascular:     Rate and  Rhythm: Normal rate and regular rhythm.     Pulses: Normal pulses.     Heart sounds: Normal heart sounds. No murmur heard.    No friction rub. No gallop.  Pulmonary:     Effort: Pulmonary effort is normal. No respiratory distress.     Breath sounds: Normal breath sounds.  Abdominal:     General: Abdomen is flat. Bowel sounds are normal. There is no distension.     Palpations: Abdomen is soft.     Tenderness: There is no abdominal tenderness.  Skin:    General: Skin is warm and dry.     Coloration: Skin is not jaundiced.  Neurological:     Mental Status: She is alert. Mental status is at baseline.     Coordination: Coordination normal.     Comments: EOMI, cranial nerves II through XII are grossly intact.  Upper and lower extremity strength symmetric bilaterally, stable gait, normal finger-nose no pronator drift     ED Results / Procedures / Treatments   Labs (all labs ordered are listed, but only abnormal results are displayed) Labs Reviewed  BASIC METABOLIC PANEL - Abnormal; Notable for the following components:      Result Value   Glucose, Bld 107 (*)    Creatinine, Ser 1.05 (*)    All other components within normal limits  CBC WITH DIFFERENTIAL/PLATELET - Abnormal; Notable for the following components:   RBC 5.36 (*)    Hemoglobin 15.3 (*)    HCT 46.4 (*)    All other components within normal limits  I-STAT BETA HCG BLOOD, ED ([facility], [facility], [facility] ONLY)    EKG None  Radiology CT Head Wo Contrast  Result Date: 08/30/2022 CLINICAL DATA:  Headache. EXAM: CT HEAD WITHOUT CONTRAST TECHNIQUE: Contiguous axial images were obtained from the base of the skull through the vertex without intravenous contrast. RADIATION DOSE REDUCTION: This exam was performed according to the departmental dose-optimization program which includes automated exposure control, adjustment of the mA and/or kV according to patient size and/or use of iterative reconstruction technique. COMPARISON:  None Available.  FINDINGS: Brain: No evidence of acute infarction, hemorrhage, hydrocephalus, extra-axial collection or mass lesion/mass effect. Vascular: No hyperdense vessel or unexpected calcification. Skull: Normal. Negative for fracture or focal lesion. Sinuses/Orbits: No acute finding. IMPRESSION: No acute intracranial process. Electronically Signed   By: Sammie Bench M.D.   On: 08/30/2022 12:30    Procedures Procedures    Medications Ordered in ED Medications  metoCLOPramide (REGLAN) injection 10 mg (10 mg Intravenous Given 08/30/22 1155)  diphenhydrAMINE (BENADRYL) injection 25 mg (25 mg Intravenous Given 08/30/22 1155)  prochlorperazine (COMPAZINE) injection 10 mg (10 mg Intravenous Given 08/30/22 1313)  dexamethasone (DECADRON) injection 4 mg (4 mg Intravenous Given 08/30/22 1313)    ED Course/  Medical Decision Making/ A&P                             Medical Decision Making Amount and/or Complexity of Data Reviewed Labs: ordered. Radiology: ordered.  Risk Prescription drug management.   Patient presents due to headache.  Symptoms seems most consistent with her migraines although today, with more sudden onset, will check CT head in case of SAH.  Also check CBC and BMP for signs of gross electrolyte derangement, AKI or dehydration.  She does not have any fever, nuchal rigidity and I do not suspect this is meningitis.  Normal neuroexam to lower suspicion for TIA or CVA. On reevaluation patient's headache is improved after fluids, Reglan, Compazine, Decadron.  CT head is negative for acute process, laboratory workup was unremarkable.  I discussed return precautions with the patient but feel she is appropriate for close outpatient follow-up with her neurologist this this appears consistent with typical migraines.        Final Clinical Impression(s) / ED Diagnoses Final diagnoses:  Other migraine without status migrainosus, not intractable    Rx / DC Orders ED Discharge Orders     None          Sherrill Raring, Hershal Coria 08/30/22 1652    Ezequiel Essex, MD 08/30/22 1655

## 2022-08-30 NOTE — ED Triage Notes (Signed)
Patient has had a 3 day migraine. She said it felt like lightening struck her eye brow. Whole face went numb and lips tingled Thursday night. Took Ubrelvy, no relief. Light sensitivity, nausea.

## 2022-08-30 NOTE — ED Notes (Signed)
Needs meds    Sherrill Raring, [place] 08/30/22 1317

## 2022-08-30 NOTE — Discharge Instructions (Signed)
You are seen today in the emergency department due to a migraine.  Your workup today was reassuring, CT of your head was normal.  Troponin fluids, take your home medicine as needed.  If the headache becomes worse or you have new or concerning symptoms return to the ED.

## 2022-09-02 ENCOUNTER — Telehealth: Payer: Self-pay

## 2022-09-02 NOTE — Transitions of Care (Post Inpatient/ED Visit) (Signed)
   09/02/2022  Name: [name] MRN: [id number] DOB: Mar 05, 1981  Today's TOC FU Call Status: Today's TOC FU Call Status:: Unsuccessful Call (2nd Attempt) Unsuccessful Call (1st Attempt) Date: 09/02/22 Unsuccessful Call (2nd Attempt) Date: 09/02/22  Attempted to reach the patient regarding the most recent Inpatient/ED visit.  Follow Up Plan: Additional outreach attempts will be made to reach the patient to complete the Transitions of Care (Post Inpatient/ED visit) call.   Signature Francella Solian, CMA

## 2022-09-02 NOTE — Transitions of Care (Post Inpatient/ED Visit) (Signed)
   09/02/2022  Name: Jerrell Adley MRN: [id number] DOB: July 23, 1980  Today's TOC FU Call Status: Today's TOC FU Call Status:: Unsuccessul Call (1st Attempt) Unsuccessful Call (1st Attempt) Date: 09/02/22  Attempted to reach the patient regarding the most recent Inpatient/ED visit.  Follow Up Plan: Additional outreach attempts will be made to reach the patient to complete the Transitions of Care (Post Inpatient/ED visit) call.   Signature  Ferne Reus, RN

## 2022-09-03 NOTE — Transitions of Care (Post Inpatient/ED Visit) (Signed)
   09/03/2022  Name: [name] MRN: [id number] DOB: May 30, 1981  Today's TOC FU Call Status: Today's TOC FU Call Status:: Successful TOC FU Call Competed Unsuccessful Call (1st Attempt) Date: 09/02/22 Unsuccessful Call (2nd Attempt) Date: 09/02/22 [facility] FU Call Complete Date: 09/03/22  Transition Care Management Follow-up Telephone Call Date of Discharge: 08/30/22 Discharge Facility: [facility]) Type of Discharge: Emergency Department Reason for ED Visit: Other: (Migraine) How have you been since you were released from the hospital?: Better Any questions or concerns?: No  Items Reviewed: Did you receive and understand the discharge instructions provided?: Yes Medications obtained and verified?: Yes (Medications Reviewed) Any new allergies since your discharge?: No Dietary orders reviewed?: NA Do you have support at home?: Yes People in Home: spouse Name of Support/Comfort Primary Source: [facility] and Equipment/Supplies: [place] Ordered?: No Any new equipment or medical supplies ordered?: No  Functional Questionnaire: Do you need assistance with bathing/showering or dressing?: No Do you need assistance with meal preparation?: No Do you need assistance with eating?: No Do you have difficulty maintaining continence: No Do you need assistance with getting out of bed/getting out of a chair/moving?: No Do you have difficulty managing or taking your medications?: No  Folllow up appointments reviewed: PCP Follow-up appointment confirmed?: NA (Patient declined follow up appointment at this time.) [facility] Follow-up appointment confirmed?: NA Do you need transportation to your follow-up appointment?: No Do you understand care options if your condition(s) worsen?: Yes-patient verbalized understanding    SIGNATURE Ferne Reus, RN

## 2022-09-29 ENCOUNTER — Other Ambulatory Visit: Payer: Self-pay | Admitting: Family Medicine

## 2022-09-29 NOTE — Telephone Encounter (Signed)
LVM for pt to cb and sch.

## 2022-09-29 NOTE — Telephone Encounter (Signed)
Refill request for methylphenidate (RITALIN) 10 MG tablet   LOV - 11/29/21 Next OV - not scheduled Last refill - 07/19/22 #120/0

## 2022-09-29 NOTE — Telephone Encounter (Signed)
Patient is past due for her CPE; please call patient to get appt set up. Patient did not read previous mychart message sent to her to schedule.

## 2022-09-30 MED ORDER — METHYLPHENIDATE HCL 10 MG PO TABS: 15.0000 mg | ORAL_TABLET | Freq: Two times a day (BID) | ORAL | 0 refills | Status: DC

## 2022-09-30 NOTE — Telephone Encounter (Signed)
Sent. Thanks.   

## 2022-10-06 ENCOUNTER — Encounter: Payer: Self-pay | Admitting: Family Medicine

## 2022-10-06 ENCOUNTER — Ambulatory Visit (INDEPENDENT_AMBULATORY_CARE_PROVIDER_SITE_OTHER): Payer: 59 | Admitting: Family Medicine

## 2022-10-06 VITALS — BP 110/72 | HR 72 | Temp 97.9°F | Ht 68.0 in | Wt 218.0 lb

## 2022-10-06 DIAGNOSIS — M8580 Other specified disorders of bone density and structure, unspecified site: Secondary | ICD-10-CM

## 2022-10-06 DIAGNOSIS — F988 Other specified behavioral and emotional disorders with onset usually occurring in childhood and adolescence: Secondary | ICD-10-CM

## 2022-10-06 DIAGNOSIS — Z7189 Other specified counseling: Secondary | ICD-10-CM

## 2022-10-06 DIAGNOSIS — G43109 Migraine with aura, not intractable, without status migrainosus: Secondary | ICD-10-CM

## 2022-10-06 DIAGNOSIS — Z8639 Personal history of other endocrine, nutritional and metabolic disease: Secondary | ICD-10-CM

## 2022-10-06 DIAGNOSIS — Z0000 Encounter for general adult medical examination without abnormal findings: Secondary | ICD-10-CM | POA: Diagnosis not present

## 2022-10-06 DIAGNOSIS — G4700 Insomnia, unspecified: Secondary | ICD-10-CM

## 2022-10-06 LAB — LIPID PANEL
Cholesterol: 263 mg/dL — ABNORMAL HIGH (ref 0–200)
HDL: 49.9 mg/dL (ref >39.00)
LDL Cholesterol: 183 mg/dL — ABNORMAL HIGH (ref 0–99)
NonHDL: 213.35
Total CHOL/HDL Ratio: 5
Triglycerides: 152 mg/dL — ABNORMAL HIGH (ref 0.0–149.0)
VLDL: 30.4 mg/dL (ref 0.0–40.0)

## 2022-10-06 LAB — TSH: TSH: 4.46 u[IU]/mL (ref 0.35–5.50)

## 2022-10-06 LAB — VITAMIN D 25 HYDROXY (VIT D DEFICIENCY, FRACTURES): VITD: 15.61 ng/mL — ABNORMAL LOW (ref 30.00–100.00)

## 2022-10-06 MED ORDER — EPINEPHRINE 0.3 MG/0.3ML IJ SOAJ: INTRAMUSCULAR | 1 refills | Status: AC

## 2022-10-06 MED ORDER — UBRELVY 50 MG PO TABS: 50.0000 mg | ORAL_TABLET | Freq: Every day | ORAL | Status: DC | PRN

## 2022-10-06 MED ORDER — UBRELVY 100 MG PO TABS: 100.0000 mg | ORAL_TABLET | Freq: Every day | ORAL | Status: DC | PRN

## 2022-10-06 NOTE — Progress Notes (Unsigned)
CPE- See plan.  Routine anticipatory guidance given to patient.  See health maintenance.  The possibility exists that previously documented standard health maintenance information may have been brought forward from a previous encounter into this note.  If needed, that same information has been updated to reflect the current situation based on today's encounter.    Tetanus 2015 Flu 2023. PNA and shingles not due.  Covid prev done.   Mammogram d/w pt.  Pap not indicated.  S/p hysterectomy.  TAH and BSO.  No hot flashes usually.   DXA ordered 2023 Colonoscopy 2023.  Living will d/w pt.  Husband designated if patient were incapacitated.  Diet and exercise d/w pt.   Pt d/w pt re: HIV screening.  Prev done 2005.    She had coffee this AM with cream.  D/w pt about still getting her labs done.  See notes on labs.  Migraine.  On topamax/compazine daily per outside clinic.  Prev neurologist moved.  Discussed that I can refill her topamax if she is still doing well.  If worsening, then she may need re-referral.  Now with migraines ~1-2 per month.  That is clearly better than prior.  Discussed h/o renal stones- she has tolerated current dose of tomapax.   Insomnia.  Sleeping well now off ambien totally.    ADD.  She doesn't use stimulant on the weekend and she can tell a change. It helps with concentration.  No ADE, no tremor, etc.  It helps enough to continue.    PMH and SH reviewed  Meds, vitals, and allergies reviewed.   ROS: Per HPI.  Unless specifically indicated otherwise in HPI, the patient denies:  General: fever. Eyes: acute vision changes ENT: sore throat Cardiovascular: chest pain Respiratory: SOB GI: vomiting GU: dysuria Musculoskeletal: acute back pain Derm: acute rash Neuro: acute motor dysfunction Psych: worsening mood Endocrine: polydipsia Heme: bleeding Allergy: hayfever  GEN: nad, alert and oriented HEENT: ncat NECK: supple w/o LA CV: rrr. PULM: ctab, no inc  wob ABD: soft, +bs EXT: no edema SKIN: well perfused.

## 2022-10-06 NOTE — Patient Instructions (Addendum)
Let me know when you need refills on topamax and compazine.   Take care.  Glad to see you. Go to the lab on the way out.   If you have mychart we'll likely use that to update you.     You can call for a mammogram at the [facility] [place] [phone]

## 2022-10-08 NOTE — Assessment & Plan Note (Signed)
On topamax/compazine daily per outside clinic.  Prev neurologist moved.  Discussed that I can refill her topamax if she is still doing well.  If worsening, then she may need re-referral.  Now with migraines ~1-2 per month.  That is clearly better than prior.  Discussed h/o renal stones- she has tolerated current dose of tomapax.  Would continue.  Use of Ubrelvy as that has been helpful.

## 2022-10-08 NOTE — Assessment & Plan Note (Signed)
Living will d/w pt.  Husband designated if patient were incapacitated.  

## 2022-10-08 NOTE — Assessment & Plan Note (Signed)
  Tetanus 2015 Flu 2023. PNA and shingles not due.  Covid prev done.   Mammogram d/w pt.  Pap not indicated.  S/p hysterectomy.  TAH and BSO.  No hot flashes usually.   DXA ordered 2023 Colonoscopy 2023.  Living will d/w pt.  Husband designated if patient were incapacitated.  Diet and exercise d/w pt.   Pt d/w pt re: HIV screening.  Prev done 2005.

## 2022-10-08 NOTE — Assessment & Plan Note (Signed)
Continue methylphenidate as is.  Update me as needed.  She agrees to plan.

## 2022-10-08 NOTE — Assessment & Plan Note (Signed)
Now off Ambien and doing well.  Continue off medication for now.

## 2022-10-12 ENCOUNTER — Other Ambulatory Visit: Payer: Self-pay | Admitting: Family Medicine

## 2022-10-12 MED ORDER — VITAMIN D (ERGOCALCIFEROL) 1.25 MG (50000 UNIT) PO CAPS: 50000.0000 [IU] | ORAL_CAPSULE | ORAL | 0 refills | Status: DC

## 2022-10-27 ENCOUNTER — Other Ambulatory Visit: Payer: Self-pay | Admitting: Family Medicine

## 2022-12-10 ENCOUNTER — Other Ambulatory Visit: Payer: Self-pay | Admitting: Family Medicine

## 2022-12-10 MED ORDER — METHYLPHENIDATE HCL 10 MG PO TABS: 15.0000 mg | ORAL_TABLET | Freq: Two times a day (BID) | ORAL | 0 refills | Status: DC

## 2022-12-10 NOTE — Telephone Encounter (Signed)
Refill request for methylphenidate (RITALIN) 10 MG tablet   LOV - 10/06/22 Next OV - not scheduled Last refill - 09/30/22 #120/0

## 2022-12-10 NOTE — Telephone Encounter (Signed)
Sent. Thanks.   

## 2023-01-05 ENCOUNTER — Encounter: Payer: Self-pay | Admitting: Family Medicine

## 2023-01-06 ENCOUNTER — Other Ambulatory Visit: Payer: Self-pay | Admitting: Family Medicine

## 2023-01-06 MED ORDER — PROCHLORPERAZINE MALEATE 10 MG PO TABS: 10.0000 mg | ORAL_TABLET | Freq: Three times a day (TID) | ORAL | 1 refills | Status: DC | PRN

## 2023-02-09 ENCOUNTER — Other Ambulatory Visit: Payer: Self-pay | Admitting: Family Medicine

## 2023-02-09 NOTE — Telephone Encounter (Signed)
Refill request for methylphenidate (RITALIN) 10 MG tablet   LOV - 10/06/22 Next OV - not scheduled Last refill - 12/10/22 #120/0

## 2023-02-10 MED ORDER — METHYLPHENIDATE HCL 10 MG PO TABS: 15.0000 mg | ORAL_TABLET | Freq: Two times a day (BID) | ORAL | 0 refills | Status: DC

## 2023-03-10 ENCOUNTER — Other Ambulatory Visit: Payer: Self-pay | Admitting: Family Medicine

## 2023-03-10 ENCOUNTER — Encounter: Payer: Self-pay | Admitting: Family Medicine

## 2023-03-10 MED ORDER — TOPIRAMATE 100 MG PO TABS: 100.0000 mg | ORAL_TABLET | Freq: Every day | ORAL | 2 refills | Status: DC

## 2023-03-27 ENCOUNTER — Other Ambulatory Visit: Payer: Self-pay | Admitting: Family Medicine

## 2023-04-20 ENCOUNTER — Other Ambulatory Visit: Payer: Self-pay | Admitting: Family Medicine

## 2023-04-20 MED ORDER — METHYLPHENIDATE HCL 10 MG PO TABS: 15.0000 mg | ORAL_TABLET | Freq: Two times a day (BID) | ORAL | 0 refills | Status: DC

## 2023-04-20 NOTE — Telephone Encounter (Signed)
LAST APPOINTMENT DATE:10/06/2022   NEXT APPOINTMENT DATE: nothing has been scheduled    LAST REFILL: 02/10/2023  QTY: 120

## 2023-04-29 ENCOUNTER — Other Ambulatory Visit: Payer: Self-pay | Admitting: Family Medicine

## 2023-06-17 ENCOUNTER — Encounter (INDEPENDENT_AMBULATORY_CARE_PROVIDER_SITE_OTHER): Payer: 59 | Admitting: Ophthalmology

## 2023-06-17 DIAGNOSIS — H33301 Unspecified retinal break, right eye: Secondary | ICD-10-CM

## 2023-06-17 DIAGNOSIS — H4423 Degenerative myopia, bilateral: Secondary | ICD-10-CM

## 2023-06-17 DIAGNOSIS — H43813 Vitreous degeneration, bilateral: Secondary | ICD-10-CM | POA: Diagnosis not present

## 2023-07-02 ENCOUNTER — Other Ambulatory Visit: Payer: Self-pay | Admitting: Family Medicine

## 2023-07-06 NOTE — Telephone Encounter (Signed)
Name of Medication: [place] Name of Pharmacy: [facility] [address] [place] Dana or Written Date and Quantity: 04/20/23 #120 tabs/ 0 refill Last Office Visit and Type: CPE 10/06/22 Next Office Visit and Type: no future appts

## 2023-07-07 MED ORDER — METHYLPHENIDATE HCL 10 MG PO TABS: 15.0000 mg | ORAL_TABLET | Freq: Two times a day (BID) | ORAL | 0 refills | Status: DC

## 2023-10-07 ENCOUNTER — Other Ambulatory Visit: Payer: Self-pay | Admitting: Family Medicine

## 2023-10-07 MED ORDER — METHYLPHENIDATE HCL 10 MG PO TABS: 15.0000 mg | ORAL_TABLET | Freq: Two times a day (BID) | ORAL | 0 refills | Status: DC

## 2023-10-07 NOTE — Telephone Encounter (Signed)
 LOV: 10/06/22 NOV: nothing scheduled LAST REFILL:methylphenidate (RITALIN) 10 MG tablet 120 tablets 0 refills

## 2023-10-07 NOTE — Telephone Encounter (Signed)
 Sent. Thanks.

## 2023-12-12 ENCOUNTER — Other Ambulatory Visit: Payer: Self-pay | Admitting: Family Medicine

## 2023-12-14 NOTE — Telephone Encounter (Signed)
 LOV:10/06/22 NOV: NOTHING SCHEDULED LAST REFILL:topiramate  (TOPAMAX ) 100 MG tablet 03/10/23 90 TABLETS 2 REFILL

## 2023-12-18 ENCOUNTER — Encounter: Payer: Self-pay | Admitting: Family Medicine

## 2023-12-18 NOTE — Telephone Encounter (Signed)
 LOV:10/06/22 NOV:12/21/23 LAST REFILL:  methylphenidate  (RITALIN ) 10 MG tablet 120 tablets 0 refill

## 2023-12-20 ENCOUNTER — Other Ambulatory Visit: Payer: Self-pay | Admitting: Family Medicine

## 2023-12-20 MED ORDER — METHYLPHENIDATE HCL 10 MG PO TABS: 15.0000 mg | ORAL_TABLET | Freq: Two times a day (BID) | ORAL | 0 refills | Status: DC

## 2023-12-21 ENCOUNTER — Telehealth: Payer: Self-pay

## 2023-12-21 ENCOUNTER — Ambulatory Visit (INDEPENDENT_AMBULATORY_CARE_PROVIDER_SITE_OTHER): Admitting: Family Medicine

## 2023-12-21 VITALS — BP 124/66 | HR 76 | Temp 99.5°F | Ht 66.34 in | Wt 211.4 lb

## 2023-12-21 DIAGNOSIS — Z8639 Personal history of other endocrine, nutritional and metabolic disease: Secondary | ICD-10-CM

## 2023-12-21 DIAGNOSIS — F988 Other specified behavioral and emotional disorders with onset usually occurring in childhood and adolescence: Secondary | ICD-10-CM

## 2023-12-21 DIAGNOSIS — Z0000 Encounter for general adult medical examination without abnormal findings: Secondary | ICD-10-CM | POA: Diagnosis not present

## 2023-12-21 DIAGNOSIS — Z7189 Other specified counseling: Secondary | ICD-10-CM

## 2023-12-21 DIAGNOSIS — I951 Orthostatic hypotension: Secondary | ICD-10-CM

## 2023-12-21 DIAGNOSIS — M8580 Other specified disorders of bone density and structure, unspecified site: Secondary | ICD-10-CM

## 2023-12-21 DIAGNOSIS — G43109 Migraine with aura, not intractable, without status migrainosus: Secondary | ICD-10-CM

## 2023-12-21 MED ORDER — PROCHLORPERAZINE MALEATE 10 MG PO TABS: 10.0000 mg | ORAL_TABLET | Freq: Every day | ORAL | Status: DC

## 2023-12-21 MED ORDER — UBRELVY 100 MG PO TABS: 100.0000 mg | ORAL_TABLET | Freq: Every day | ORAL | 3 refills | Status: AC | PRN

## 2023-12-21 NOTE — Patient Instructions (Addendum)
 Please ask the front for your last report from the eye clinic.    You can call for a mammogram at the [facility] [address] [place] [address] [place]  Go to the lab on the way out.   If you have mychart we'll likely use that to update you.    Take care.  Glad to see you.

## 2023-12-21 NOTE — Progress Notes (Signed)
 CPE- See plan.  Routine anticipatory guidance given to patient.  See health maintenance.  The possibility exists that previously documented standard health maintenance information may have been brought forward from a previous encounter into this note.  If needed, that same information has been updated to reflect the current situation based on today's encounter.    Tetanus 2015 Flu 2023. PNA and shingles not due.  Covid prev done.   Mammogram d/w pt.  Due, d/w pt.   Pap not indicated.  S/p hysterectomy.  TAH and BSO.  No hot flashes usually.   DXA done 2023- osteopenia, consider repeat 2026 Colonoscopy 2023.  Living will d/w pt.  Husband designated if patient were incapacitated.  Diet and exercise d/w pt.   Pt d/w pt re: HIV screening.  Prev done 2005.    She had to have cataract surgery this year.  D/w pt. requesting records.  See after visit summary.  Migraines.  She had more headaches in the midst of vision changes between 05/2023 through 07/2023.  She is better in the meantime, back to prev baseline. She prev improved on topamax  with prn ubrelvy .    ADD.  Using ritalin  weekdays, less on the weekends.  It helps.  No tremor.  No insomnia with med use. It helped with concentration.    She had questions about potential POTS diagnosis.  She has history of syncope x 3-4 times lifetime.  She can get lightheaded upon standing with elevated pulse.  Symptoms are worse in hot weather.  She has noted a salt running on her close after heavy sweating.  She already added extra salt to her diet and she has been careful to drink plenty of fluid.  She has been on Topamax  chronically and it is unclear if this contributes to any of her symptoms.  Discussed.  PMH and SH reviewed  Meds, vitals, and allergies reviewed.   ROS: Per HPI.  Unless specifically indicated otherwise in HPI, the patient denies:  General: fever. Eyes: acute vision changes ENT: sore throat Cardiovascular: chest pain Respiratory:  SOB GI: vomiting GU: dysuria Musculoskeletal: acute back pain Derm: acute rash Neuro: acute motor dysfunction Psych: worsening mood Endocrine: polydipsia Heme: bleeding Allergy: hayfever  GEN: nad, alert and oriented HEENT: mucous membranes moist NECK: supple w/o LA CV: rrr. PULM: ctab, no inc wob ABD: soft, +bs EXT: no edema SKIN: no acute rash

## 2023-12-21 NOTE — Telephone Encounter (Signed)
 Please start prior authorization for methylphenidate  (RITALIN ) 10 MG tablet . Patient is out of medication

## 2023-12-22 LAB — COMPREHENSIVE METABOLIC PANEL WITH GFR
AG Ratio: 1.7 (calc) (ref 1.0–2.5)
ALT: 29 U/L (ref 6–29)
AST: 20 U/L (ref 10–30)
Albumin: 4.5 g/dL (ref 3.6–5.1)
Alkaline phosphatase (APISO): 95 U/L (ref 31–125)
BUN/Creatinine Ratio: 16 (calc) (ref 6–22)
BUN: 16 mg/dL (ref 7–25)
CO2: 25 mmol/L (ref 20–32)
Calcium: 10.2 mg/dL (ref 8.6–10.2)
Chloride: 106 mmol/L (ref 98–110)
Creat: 1.02 mg/dL — ABNORMAL HIGH (ref 0.50–0.99)
Globulin: 2.7 g/dL (ref 1.9–3.7)
Glucose, Bld: 92 mg/dL (ref 65–99)
Potassium: 4.5 mmol/L (ref 3.5–5.3)
Sodium: 140 mmol/L (ref 135–146)
Total Bilirubin: 0.3 mg/dL (ref 0.2–1.2)
Total Protein: 7.2 g/dL (ref 6.1–8.1)
eGFR: 70 mL/min/{1.73_m2} (ref >=60)

## 2023-12-22 LAB — LIPID PANEL
Cholesterol: 255 mg/dL — ABNORMAL HIGH (ref <200)
HDL: 40 mg/dL — ABNORMAL LOW (ref >=50)
LDL Cholesterol (Calc): 174 mg/dL — ABNORMAL HIGH
Non-HDL Cholesterol (Calc): 215 mg/dL — ABNORMAL HIGH (ref <130)
Total CHOL/HDL Ratio: 6.4 (calc) — ABNORMAL HIGH (ref <5.0)
Triglycerides: 248 mg/dL — ABNORMAL HIGH (ref <150)

## 2023-12-22 LAB — CBC WITH DIFFERENTIAL/PLATELET
Absolute Lymphocytes: 4547 {cells}/uL — ABNORMAL HIGH (ref 850–3900)
Absolute Monocytes: 1244 {cells}/uL — ABNORMAL HIGH (ref 200–950)
Basophils Absolute: 114 {cells}/uL (ref 0–200)
Basophils Relative: 0.8 %
Eosinophils Absolute: 400 {cells}/uL (ref 15–500)
Eosinophils Relative: 2.8 %
HCT: 47.9 % — ABNORMAL HIGH (ref 35.0–45.0)
Hemoglobin: 15.2 g/dL (ref 11.7–15.5)
MCH: 28.1 pg (ref 27.0–33.0)
MCHC: 31.7 g/dL — ABNORMAL LOW (ref 32.0–36.0)
MCV: 88.5 fL (ref 80.0–100.0)
MPV: 10.2 fL (ref 7.5–12.5)
Monocytes Relative: 8.7 %
Neutro Abs: 7994 {cells}/uL — ABNORMAL HIGH (ref 1500–7800)
Neutrophils Relative %: 55.9 %
Platelets: 482 10*3/uL — ABNORMAL HIGH (ref 140–400)
RBC: 5.41 10*6/uL — ABNORMAL HIGH (ref 3.80–5.10)
RDW: 12.4 % (ref 11.0–15.0)
Total Lymphocyte: 31.8 %
WBC: 14.3 10*3/uL — ABNORMAL HIGH (ref 3.8–10.8)

## 2023-12-22 LAB — VITAMIN D 25 HYDROXY (VIT D DEFICIENCY, FRACTURES): Vit D, 25-Hydroxy: 31 ng/mL (ref 30–100)

## 2023-12-22 LAB — TSH: TSH: 5.53 m[IU]/L — ABNORMAL HIGH

## 2023-12-23 ENCOUNTER — Telehealth: Payer: Self-pay

## 2023-12-23 ENCOUNTER — Emergency Department (HOSPITAL_COMMUNITY)
Admission: EM | Admit: 2023-12-23 | Discharge: 2023-12-23 | Disposition: A | Discharge status: Home / Self Care | Attending: Emergency Medicine | Admitting: Emergency Medicine

## 2023-12-23 ENCOUNTER — Encounter: Payer: Self-pay | Admitting: Family Medicine

## 2023-12-23 ENCOUNTER — Emergency Department (HOSPITAL_COMMUNITY)

## 2023-12-23 ENCOUNTER — Other Ambulatory Visit (HOSPITAL_COMMUNITY): Payer: Self-pay

## 2023-12-23 ENCOUNTER — Telehealth: Payer: Self-pay | Admitting: Family Medicine

## 2023-12-23 ENCOUNTER — Other Ambulatory Visit: Payer: Self-pay

## 2023-12-23 DIAGNOSIS — N200 Calculus of kidney: Secondary | ICD-10-CM | POA: Insufficient documentation

## 2023-12-23 DIAGNOSIS — R109 Unspecified abdominal pain: Secondary | ICD-10-CM

## 2023-12-23 DIAGNOSIS — Z91040 Latex allergy status: Secondary | ICD-10-CM | POA: Diagnosis not present

## 2023-12-23 LAB — CBC WITH DIFFERENTIAL/PLATELET
Abs Immature Granulocytes: 0.16 10*3/uL — ABNORMAL HIGH (ref 0.00–0.07)
Basophils Absolute: 0.1 10*3/uL (ref 0.0–0.1)
Basophils Relative: 1 %
Eosinophils Absolute: 0.6 10*3/uL — ABNORMAL HIGH (ref 0.0–0.5)
Eosinophils Relative: 4 %
HCT: 48.1 % — ABNORMAL HIGH (ref 36.0–46.0)
Hemoglobin: 15.4 g/dL — ABNORMAL HIGH (ref 12.0–15.0)
Immature Granulocytes: 1 %
Lymphocytes Relative: 39 %
Lymphs Abs: 6.2 10*3/uL — ABNORMAL HIGH (ref 0.7–4.0)
MCH: 28.6 pg (ref 26.0–34.0)
MCHC: 32 g/dL (ref 30.0–36.0)
MCV: 89.4 fL (ref 80.0–100.0)
Monocytes Absolute: 1.4 10*3/uL — ABNORMAL HIGH (ref 0.1–1.0)
Monocytes Relative: 9 %
Neutro Abs: 7.6 10*3/uL (ref 1.7–7.7)
Neutrophils Relative %: 46 %
Platelets: 452 10*3/uL — ABNORMAL HIGH (ref 150–400)
RBC: 5.38 MIL/uL — ABNORMAL HIGH (ref 3.87–5.11)
RDW: 13.2 % (ref 11.5–15.5)
WBC: 16 10*3/uL — ABNORMAL HIGH (ref 4.0–10.5)
nRBC: 0 % (ref 0.0–0.2)

## 2023-12-23 LAB — URINALYSIS, ROUTINE W REFLEX MICROSCOPIC
Bacteria, UA: NONE SEEN
Bilirubin Urine: NEGATIVE
Glucose, UA: NEGATIVE mg/dL
Ketones, ur: NEGATIVE mg/dL
Leukocytes,Ua: NEGATIVE
Nitrite: NEGATIVE
Protein, ur: NEGATIVE mg/dL
Specific Gravity, Urine: 1.018 (ref 1.005–1.030)
pH: 6 (ref 5.0–8.0)

## 2023-12-23 LAB — BASIC METABOLIC PANEL WITH GFR
Anion gap: 9 (ref 5–15)
BUN: 18 mg/dL (ref 6–20)
CO2: 25 mmol/L (ref 22–32)
Calcium: 9.4 mg/dL (ref 8.9–10.3)
Chloride: 103 mmol/L (ref 98–111)
Creatinine, Ser: 1.04 mg/dL — ABNORMAL HIGH (ref 0.44–1.00)
GFR, Estimated: >60 mL/min (ref >60)
Glucose, Bld: 102 mg/dL — ABNORMAL HIGH (ref 70–99)
Potassium: 3.5 mmol/L (ref 3.5–5.1)
Sodium: 137 mmol/L (ref 135–145)

## 2023-12-23 LAB — HCG, SERUM, QUALITATIVE: Preg, Serum: NEGATIVE

## 2023-12-23 MED ORDER — ONDANSETRON HCL 4 MG/2ML IJ SOLN
4.0000 mg | Freq: Once | INTRAMUSCULAR | Status: AC
  Administered 2023-12-23: 4 mg via INTRAVENOUS
  Filled 2023-12-23: qty 2

## 2023-12-23 MED ORDER — ONDANSETRON 4 MG PO TBDP
4.0000 mg | ORAL_TABLET | Freq: Once | ORAL | Status: AC
  Administered 2023-12-23: 4 mg via ORAL
  Filled 2023-12-23: qty 1

## 2023-12-23 MED ORDER — OXYCODONE-ACETAMINOPHEN 5-325 MG PO TABS: 1.0000 | ORAL_TABLET | Freq: Three times a day (TID) | ORAL | 0 refills | Status: AC | PRN

## 2023-12-23 MED ORDER — MORPHINE SULFATE (PF) 4 MG/ML IV SOLN
4.0000 mg | Freq: Once | INTRAVENOUS | Status: AC
  Administered 2023-12-23: 4 mg via INTRAVENOUS
  Filled 2023-12-23: qty 1

## 2023-12-23 MED ORDER — SODIUM CHLORIDE 0.9 % IV BOLUS
1000.0000 mL | Freq: Once | INTRAVENOUS | Status: AC
  Administered 2023-12-23: 1000 mL via INTRAVENOUS

## 2023-12-23 MED ORDER — ONDANSETRON 4 MG PO TBDP: 4.0000 mg | ORAL_TABLET | Freq: Three times a day (TID) | ORAL | 0 refills | Status: AC | PRN

## 2023-12-23 NOTE — ED Provider Notes (Signed)
 [place] EMERGENCY DEPARTMENT AT [facility] Provider Note   CSN: [id number] Arrival date & time: 12/23/23  1757     Patient presents with: Nausea, Flank Pain, and Fever   [name] is a 43 y.o. female.   Patient with history of kidney stones presents today with complaints of left flank pain, nausea.  States same feels exactly like kidney stones she has had before.  She notes she has had stenting and lithotripsy before.  She saw her doctor and was sent here to rule out a kidney stone.  She denies any vomiting or diarrhea.  No fevers or chills.  No abdominal pain.  No urinary symptoms.  The history is provided by the patient. No language interpreter was used.  Flank Pain  Fever      Prior to Admission medications   Medication Sig Start Date End Date Taking? Authorizing Provider  brimonidine (ALPHAGAN) 0.15 % ophthalmic solution Place 1 drop into both eyes daily. 12/08/23   [provider]  calcium-vitamin D  (OSCAL WITH D) 500-200 MG-UNIT tablet Take 1 tablet by mouth daily with breakfast.    [provider]  Cholecalciferol (VITAMIN D3) 50 MCG (2000 UT) capsule Take 1 capsule (2,000 Units total) by mouth daily. 10/09/21   Donnie Galea, MD  EPINEPHrine  0.3 mg/0.3 mL IJ SOAJ injection INJECT 0.3 MLS INTO THE MUSCLE ONCE FOR 1 DOSE. 10/06/22   Donnie Galea, MD  esomeprazole (NEXIUM) 40 MG packet Take 40 mg by mouth daily before breakfast. 11/25/21   Anna, Kiran, MD  hydrocortisone -pramoxine (PROCTOFOAM  HC) rectal foam Place 1 applicator rectally 2 (two) times daily. 11/29/21   Dorothe Gaster, NP  methylphenidate  (RITALIN ) 10 MG tablet Take 1.5-2 tablets (15-20 mg total) by mouth 2 (two) times daily. 12/20/23   Donnie Galea, MD  MIEBO 1.338 GM/ML SOLN Place 3 mLs into both eyes in the morning, at noon, in the evening, and at bedtime. 12/08/23   [provider]  prochlorperazine  (COMPAZINE ) 10 MG tablet Take 1 tablet (10 mg total) by  mouth at bedtime. With extra dose during the day if needed. 12/21/23   Donnie Galea, MD  tiZANidine  (ZANAFLEX ) 4 MG tablet TAKE 1 TABLET BY MOUTH EVERY 6 HOURS AS NEEDED 04/29/23   Donnie Galea, MD  topiramate  (TOPAMAX ) 100 MG tablet TAKE 1 TABLET BY MOUTH EVERY DAY 12/14/23   Donnie Galea, MD  Ubrogepant  (UBRELVY ) 100 MG TABS Take 1 tablet (100 mg total) by mouth daily as needed (for migraine). 12/21/23   Donnie Galea, MD    Allergies: Amoxicillin-pot clavulanate, Latex, Aspirin, Egg-derived products, Ibuprofen, Nsaids, Other, Ozempic (0.25 or 0.5 mg-dose) [semaglutide(0.25 or 0.5mg -dos)], Penicillins, and Red dye #40 (allura red)    Review of Systems  Genitourinary:  Positive for flank pain.  All other systems reviewed and are negative.   Updated Vital Signs BP 111/81   Pulse 70   Resp 16   Ht 5' 6 (1.676 m)   Wt 95 kg   SpO2 100%   BMI 33.80 kg/m   Physical Exam Vitals and nursing note reviewed.  Constitutional:      General: She is not in acute distress.    Appearance: Normal appearance. She is normal weight. She is not ill-appearing, toxic-appearing or diaphoretic.  HENT:     Head: Normocephalic and atraumatic.   Cardiovascular:     Rate and Rhythm: Normal rate.  Pulmonary:     Effort: Pulmonary effort is  normal. No respiratory distress.  Abdominal:     Tenderness: There is left CVA tenderness.   Musculoskeletal:        General: Normal range of motion.     Cervical back: Normal range of motion.   Skin:    General: Skin is warm and dry.   Neurological:     General: No focal deficit present.     Mental Status: She is alert.   Psychiatric:        Mood and Affect: Mood normal.        Behavior: Behavior normal.     (all labs ordered are listed, but only abnormal results are displayed) Labs Reviewed  CBC WITH DIFFERENTIAL/PLATELET - Abnormal; Notable for the following components:      Result Value   WBC 16.0 (*)    RBC 5.38 (*)    Hemoglobin  15.4 (*)    HCT 48.1 (*)    Platelets 452 (*)    Lymphs Abs 6.2 (*)    Monocytes Absolute 1.4 (*)    Eosinophils Absolute 0.6 (*)    Abs Immature Granulocytes 0.16 (*)    All other components within normal limits  BASIC METABOLIC PANEL WITH GFR - Abnormal; Notable for the following components:   Glucose, Bld 102 (*)    Creatinine, Ser 1.04 (*)    All other components within normal limits  URINALYSIS, ROUTINE W REFLEX MICROSCOPIC - Abnormal; Notable for the following components:   Hgb urine dipstick SMALL (*)    All other components within normal limits  HCG, SERUM, QUALITATIVE    EKG: None  Radiology: CT Renal Stone Study Result Date: 12/23/2023 CLINICAL DATA:  Flank pain left-sided EXAM: CT ABDOMEN AND PELVIS WITHOUT CONTRAST TECHNIQUE: Multidetector CT imaging of the abdomen and pelvis was performed following the standard protocol without IV contrast. RADIATION DOSE REDUCTION: This exam was performed according to the departmental dose-optimization program which includes automated exposure control, adjustment of the mA and/or kV according to patient size and/or use of iterative reconstruction technique. COMPARISON:  CT 07/03/2021 FINDINGS: Lower chest: Lung bases demonstrate no acute airspace disease. Hepatobiliary: No focal liver abnormality is seen. No gallstones, gallbladder wall thickening, or biliary dilatation. Pancreas: Unremarkable. No pancreatic ductal dilatation or surrounding inflammatory changes. Spleen: Normal in size without focal abnormality. Adrenals/Urinary Tract: Adrenal glands are normal. Kidneys show no hydronephrosis. Bilateral kidney stones, on the right measuring up to 4 mm. On the left, measuring up to 4 mm. The bladder is unremarkable Stomach/Bowel: Stomach is within normal limits. No evidence of bowel wall thickening, distention, or inflammatory changes. Vascular/Lymphatic: No significant vascular findings are present. No enlarged abdominal or pelvic lymph nodes.  Reproductive: Status post hysterectomy. No adnexal masses. Other: Negative for pelvic effusion or free air Musculoskeletal: No acute or suspicious osseous abnormality IMPRESSION: Negative for hydronephrosis or hydroureter. Bilateral nonobstructing kidney stones. Electronically Signed   By: Esmeralda Hedge M.D.   On: 12/23/2023 20:52     Procedures   Medications Ordered in the ED  ondansetron  (ZOFRAN -ODT) disintegrating tablet 4 mg (4 mg Oral Given 12/23/23 1855)  morphine  (PF) 4 MG/ML injection 4 mg (4 mg Intravenous Given 12/23/23 2022)  sodium chloride  0.9 % bolus 1,000 mL (1,000 mLs Intravenous New Bag/Given 12/23/23 2023)                                    Medical Decision Making Risk Prescription drug  management.   This patient is a 43 y.o. female who presents to the ED for concern of left flank pain, this involves an extensive number of treatment options, and is a complaint that carries with it a high risk of complications and morbidity. The emergent differential diagnosis prior to evaluation includes, but is not limited to,  AAA, renal vascular thrombosis, mesenteric ischemia, pyelonephritis, nephrolithiasis, cystitis, biliary colic, pancreatitis, PUD, appendicitis, diverticulitis, bowel obstruction, Ectopic Pregnancy, PID/TOA, Ovarian cyst, Ovarian torsion  This is not an exhaustive differential.   Past Medical History / Co-morbidities / Social History:  has a past medical history of ADD (attention deficit disorder), ALLERGIC RHINITIS (10/11/2009), Allergy to latex (04/11/2010), ANKLE PAIN, RIGHT (04/03/2009), BACK PAIN (03/19/2009), COMMON MIGRAINE (03/19/2009), FLANK PAIN, RIGHT (05/11/2009), GLUCOSE INTOLERANCE (03/19/2009), HYDRONEPHROSIS, RIGHT (05/11/2009), INSOMNIA-SLEEP DISORDER-UNSPEC (03/19/2009), Left tibial fracture (2018), Migraine, and RENAL CALCULUS, RIGHT (05/11/2009).  Additional history: Chart reviewed. Pertinent results include: seen for kidney stones  previously  Physical Exam: Physical exam performed. The pertinent findings include: well appearing, left CVA TTP.  No overlying skin changes.  Lab Tests: I ordered, and personally interpreted labs.  The pertinent results include:  WBC 16.0, hgb 15.4, platelets 452 likely at least in part due to hemoconcentration from dehydration.  Kidney function at baseline. UA noninfectious   Imaging Studies: I ordered imaging studies including CT renal. I independently visualized and interpreted imaging which showed   Negative for hydronephrosis or hydroureter. Bilateral nonobstructing kidney stones.  I agree with the radiologist interpretation.  Medications: I ordered medication including morphine , zofran , fluids  for pain, nausea, dehydration. Reevaluation of the patient after these medicines showed that the patient improved. I have reviewed the patients home medicines and have made adjustments as needed.   Disposition: After consideration of the diagnostic results and the patients response to treatment, I feel that emergency department workup does not suggest an emergent condition requiring admission or immediate intervention beyond what has been performed at this time. The plan is: discharge with pain medicine, zofran , urology follow-up and return precautions.  PDMP reviewed.  Patient advised not to drive or operate heavy machinery with taking medicines.  Her UA is noninfectious.  She feels better after above interventions and feels ready to go home. Evaluation and diagnostic testing in the emergency department does not suggest an emergent condition requiring admission or immediate intervention beyond what has been performed at this time.  Plan for discharge with close PCP follow-up.  Patient is understanding and amenable with plan, educated on red flag symptoms that would prompt immediate return.  Patient discharged in stable condition.  Final diagnoses:  Left flank pain  Bilateral kidney stones     ED Discharge Orders          Ordered    oxyCODONE -acetaminophen  (PERCOCET/ROXICET) 5-325 MG tablet  Every 8 hours PRN        12/23/23 2219    ondansetron  (ZOFRAN -ODT) 4 MG disintegrating tablet  Every 8 hours PRN        12/23/23 2219          An After Visit Summary was printed and given to the patient.      Fredna Jasper 12/23/23 2220    Lind Repine, MD 12/24/23 [phone]

## 2023-12-23 NOTE — ED Notes (Signed)
 Patient back from CT scan.

## 2023-12-23 NOTE — Telephone Encounter (Signed)
 Pharmacy Patient Advocate Encounter   Received notification from Onbase that prior authorization for Methylphenidate  HCl 10MG  tablets  is required/requested.   Insurance verification completed.   The patient is insured through [employer] [facility] .   Per test claim: PA required; PA submitted to above mentioned insurance via CoverMyMeds Key/confirmation #/EOC [id number] Status is pending

## 2023-12-23 NOTE — Telephone Encounter (Signed)
 Noted. Thanks.

## 2023-12-23 NOTE — Telephone Encounter (Signed)
 Patient called returning Joellen's call, which she has left for the day.  I delivered the message of what the provider said about going to the ED.  Advised patient if they did not go to the ED, Joellen will receive this message tomorrow and return her call.

## 2023-12-23 NOTE — ED Triage Notes (Signed)
 Patient arrived reporting she has been having flank pain since Monday. The pain is on left side. No changes to urinary changes. Fever  Nausea, no vomiting. States sent by MD to R/O kidney stone. Has hx of stones.

## 2023-12-23 NOTE — Telephone Encounter (Signed)
 Closing encounter. Prior authorization is pending

## 2023-12-23 NOTE — Telephone Encounter (Signed)
 Left message to return call to our office.

## 2023-12-23 NOTE — ED Provider Triage Note (Signed)
 Emergency Medicine Provider Triage Evaluation Note  [name] , a 43 y.o. female  was evaluated in triage.  Pt complains of L sided flank pain with nausea x 3 days. Seen by PCP on Monday with leukocytosis on CBC 14.3.   Endorses subjective fevers Denies vision changes, neck pain, chest pain, shortness of breath, abdominal pain, vaginal bleeding, vaginal discharge, LLE.   Review of Systems  Positive: See above Negative: See above  Physical Exam  Ht 5' 6 (1.676 m)   Wt 95 kg   BMI 33.80 kg/m  Gen:   Awake, no distress   Resp:  Normal effort  MSK:   Moves extremities without difficulty  Other:    Medical Decision Making  Medically screening exam initiated at 6:44 PM.  Appropriate orders placed.  [name] was informed that the remainder of the evaluation will be completed by another provider, this initial triage assessment does not replace that evaluation, and the importance of remaining in the ED until their evaluation is complete.     Hayes Lipps, PA-C 12/23/23 [phone]

## 2023-12-23 NOTE — Telephone Encounter (Signed)
 Pharmacy Patient Advocate Encounter   Received notification from CoverMyMeds that prior authorization for Ubrelvy  100MG  tablets  is required/requested.   Insurance verification completed.   The patient is insured through [employer] .   Per test claim: PA required; PA started via CoverMyMeds. KEY [id number] . Waiting for clinical questions to populate.

## 2023-12-23 NOTE — Discharge Instructions (Signed)
 As we discussed, you were seen in the emergency department and found to have a kidney stone.  Please take ibuprofen as needed for pain.  We are sending you home with multiple medications to assist with passing the stone and for residual pain/nausea:  -Percocet-this is a narcotic/controlled substance medication that has potential addicting qualities.  We recommend that you take 1-2 tablets every 6 hours as needed for severe pain.  Do not drive or operate heavy machinery when taking this medicine as it can be sedating. Do not drink alcohol or take other sedating medications when taking this medicine for safety reasons.  Keep this out of reach of small children.  Please be aware this medicine has Tylenol  in it (325 mg/tab) do not exceed the maximum dose of Tylenol  in a day per over the counter recommendations should you decide to supplement with Tylenol  over the counter.   -Zofran -this is an antinausea medication, you may take this every 8 hours as needed for nausea and vomiting, please allow the tablet to dissolve underneath of your tongue.   We have prescribed you new medication(s) today. Discuss the medications prescribed today with your pharmacist as they can have adverse effects and interactions with your other medicines including over the counter and prescribed medications. Seek medical evaluation if you start to experience new or abnormal symptoms after taking one of these medicines, seek care immediately if you start to experience difficulty breathing, feeling of your throat closing, facial swelling, or rash as these could be indications of a more serious allergic reaction  Please follow-up with the urology group provided in your discharge instructions within 3 to 5 days.  Return to the ER for new or worsening symptoms including but not limited to worsening pain not controlled by these medicines, inability to keep fluids down, fever, or any other concerns that you may have.

## 2023-12-23 NOTE — Telephone Encounter (Signed)
 Called patient states that pain at this time is a 6/10. It is steady with sharp pains at times. Patient states that fever her temperature has been running about 99.5 with tylenol . She has had [place] but no vomiting. Denies any dysuria, odor of frequency with urination. Advised with fever and increased pain that she be seen at ED. Patient agreed and will go to [facility] ED now.

## 2023-12-23 NOTE — Telephone Encounter (Signed)
 See MyChart message pasted below.  Please triage patient about her pain.  Thanks. --------------------------------------------     [name] to [facility] Clinical (supporting You)    12/23/23 10:22 AM I'm having pain over my left kidney, like a possible kidney stone. Should I schedule an appointment with your office or go to emergency if it doesn't lessen up?   Thank you,   [name]  --------------------------  Me to [name]     12/23/23  4:01 PM We will call you about this.  If you are having severe pain or fever then I would go to the emergency room. Take care.   Bernetta Brilliant

## 2023-12-24 ENCOUNTER — Other Ambulatory Visit (HOSPITAL_COMMUNITY): Payer: Self-pay

## 2023-12-24 ENCOUNTER — Ambulatory Visit: Payer: Self-pay | Admitting: Family Medicine

## 2023-12-24 ENCOUNTER — Other Ambulatory Visit: Payer: Self-pay | Admitting: Family Medicine

## 2023-12-24 DIAGNOSIS — I951 Orthostatic hypotension: Secondary | ICD-10-CM | POA: Insufficient documentation

## 2023-12-24 DIAGNOSIS — R7989 Other specified abnormal findings of blood chemistry: Secondary | ICD-10-CM

## 2023-12-24 NOTE — Telephone Encounter (Signed)
 Pharmacy Patient Advocate Encounter  Received notification from [facility] that Prior Authorization for Ubrelvy  100MG  tablets  has been APPROVED from 12/24/2023 to 12/23/2024 SEE OUTCOME BELOW  Outcome Approved today by [facility] NCPDP 2017 Your PA request has been approved. Additional information will be provided in the approval communication. (Message 1145) Effective Date: 12/24/2023 Authorization Expiration Date: 12/23/2024   PA #/Case ID/Reference #: [id number]

## 2023-12-24 NOTE — Telephone Encounter (Signed)
 Pharmacy Patient Advocate Encounter  Received notification from [facility] that Prior Authorization for Methylphenidate  HCl 10MG  tablets  has been APPROVED from 12/23/23 to 12/22/26. Unable to obtain price due to refill too soon rejection, last fill date 12/23/23 next available fill date07/16/25   PA #/Case ID/Reference #: [id number]

## 2023-12-24 NOTE — Assessment & Plan Note (Signed)
 Tetanus 2015 Flu 2023. PNA and shingles not due.  Covid prev done.   Mammogram d/w pt.  Due, d/w pt.   Pap not indicated.  S/p hysterectomy.  TAH and BSO.  No hot flashes usually.   DXA done 2023- osteopenia, consider repeat 2026 Colonoscopy 2023.  Living will d/w pt.  Husband designated if patient were incapacitated.  Diet and exercise d/w pt.   Pt d/w pt re: HIV screening.  Prev done 2005.

## 2023-12-24 NOTE — Assessment & Plan Note (Signed)
 She had more headaches in the midst of vision changes between 05/2023 through 07/2023.  She is better in the meantime, back to prev baseline. She prev improved on topamax  with prn ubrelvy .   Would continue as is.

## 2023-12-24 NOTE — Telephone Encounter (Signed)
 Closing encounter. Patient did go to ED

## 2023-12-24 NOTE — Assessment & Plan Note (Signed)
 DXA done 2023- osteopenia, consider repeat 2026

## 2023-12-24 NOTE — Assessment & Plan Note (Signed)
 History of, unclear if she has symptoms related to relatively heavy salt excretion versus POTS versus another issue.  Discussed maintaining adequate fluid and sodium intake and referring to cardiology.  Order placed.  See notes on labs.  She agrees with plan.

## 2023-12-24 NOTE — Assessment & Plan Note (Signed)
 Using ritalin  weekdays, less on the weekends.  It helps.  No tremor.  No insomnia with med use. It helped with concentration.   Would continue as is.

## 2023-12-24 NOTE — Telephone Encounter (Signed)
Patient notified of the approval

## 2023-12-24 NOTE — Assessment & Plan Note (Signed)
Living will d/w pt.  Husband designated if patient were incapacitated.  

## 2023-12-25 ENCOUNTER — Other Ambulatory Visit: Payer: Self-pay | Admitting: Family Medicine

## 2023-12-25 ENCOUNTER — Other Ambulatory Visit (HOSPITAL_COMMUNITY): Payer: Self-pay

## 2023-12-25 DIAGNOSIS — Z1231 Encounter for screening mammogram for malignant neoplasm of breast: Secondary | ICD-10-CM

## 2023-12-30 ENCOUNTER — Encounter

## 2023-12-30 DIAGNOSIS — Z1231 Encounter for screening mammogram for malignant neoplasm of breast: Secondary | ICD-10-CM

## 2024-01-03 ENCOUNTER — Other Ambulatory Visit: Payer: Self-pay | Admitting: Family Medicine

## 2024-01-07 ENCOUNTER — Ambulatory Visit
Admission: RE | Admit: 2024-01-07 | Discharge: 2024-01-07 | Disposition: A | Discharge status: Home / Self Care | Source: Ambulatory Visit | Attending: Family Medicine | Admitting: Family Medicine

## 2024-01-07 DIAGNOSIS — Z1231 Encounter for screening mammogram for malignant neoplasm of breast: Secondary | ICD-10-CM

## 2024-01-14 ENCOUNTER — Other Ambulatory Visit: Payer: Self-pay | Admitting: Family Medicine

## 2024-01-14 DIAGNOSIS — R928 Other abnormal and inconclusive findings on diagnostic imaging of breast: Secondary | ICD-10-CM

## 2024-01-15 ENCOUNTER — Telehealth: Payer: Self-pay | Admitting: Family Medicine

## 2024-01-15 NOTE — Telephone Encounter (Signed)
 Placed in your tray

## 2024-01-15 NOTE — Telephone Encounter (Signed)
 Reecived fax from [facility] for an Urgent order for pt, that requires Dr. Elfredia signature. Fax is in [address]

## 2024-01-15 NOTE — Telephone Encounter (Signed)
 I will work on the hard copy.  I am out of clinic today. Thanks.

## 2024-01-16 ENCOUNTER — Ambulatory Visit: Payer: Self-pay | Admitting: Family Medicine

## 2024-01-20 ENCOUNTER — Ambulatory Visit
Admission: RE | Admit: 2024-01-20 | Discharge: 2024-01-20 | Disposition: A | Discharge status: Home / Self Care | Source: Ambulatory Visit | Attending: Family Medicine | Admitting: Family Medicine

## 2024-01-20 ENCOUNTER — Ambulatory Visit: Payer: Self-pay | Admitting: Family Medicine

## 2024-01-20 DIAGNOSIS — R928 Other abnormal and inconclusive findings on diagnostic imaging of breast: Secondary | ICD-10-CM

## 2024-01-21 ENCOUNTER — Other Ambulatory Visit: Payer: Self-pay | Admitting: Family Medicine

## 2024-01-21 DIAGNOSIS — N6310 Unspecified lump in the right breast, unspecified quadrant: Secondary | ICD-10-CM

## 2024-01-31 ENCOUNTER — Other Ambulatory Visit: Payer: Self-pay | Admitting: Family Medicine

## 2024-02-03 NOTE — Telephone Encounter (Signed)
 Sent. Thanks.

## 2024-02-03 NOTE — Telephone Encounter (Signed)
 LOV: 12/21/23 NOV: nothing scheduled Last Refill: tiZANidine  (ZANAFLEX ) 4 MG tablet  04/29/23 360 tablets 1 refill prochlorperazine  (COMPAZINE ) 10 MG tablet  01/04/24 90 tablets 0 refills

## 2024-02-24 ENCOUNTER — Encounter: Payer: Self-pay | Admitting: Family Medicine

## 2024-02-24 DIAGNOSIS — F988 Other specified behavioral and emotional disorders with onset usually occurring in childhood and adolescence: Secondary | ICD-10-CM

## 2024-02-24 MED ORDER — METHYLPHENIDATE HCL 10 MG PO TABS: 15.0000 mg | ORAL_TABLET | Freq: Two times a day (BID) | ORAL | 0 refills | Status: DC

## 2024-02-24 NOTE — Telephone Encounter (Signed)
 Name of Medication:  Ritalin  Name of Pharmacy:  [facility] Last Kandra or Written Date and Quantity:  12/20/23, #120 Last Office Visit and Type:  12/21/23, CPE Next Office Visit and Type:  none Last Controlled Substance Agreement Date:  04/25/15 Last UDS:  04/25/15

## 2024-04-07 ENCOUNTER — Ambulatory Visit: Payer: Self-pay

## 2024-04-07 ENCOUNTER — Ambulatory Visit: Admitting: Family

## 2024-04-07 ENCOUNTER — Encounter: Payer: Self-pay | Admitting: Family

## 2024-04-07 VITALS — BP 116/78 | HR 80 | Temp 98.3°F | Ht 66.0 in | Wt 205.0 lb

## 2024-04-07 DIAGNOSIS — E049 Nontoxic goiter, unspecified: Secondary | ICD-10-CM

## 2024-04-07 DIAGNOSIS — G43909 Migraine, unspecified, not intractable, without status migrainosus: Secondary | ICD-10-CM

## 2024-04-07 LAB — CBC WITH DIFFERENTIAL/PLATELET
Basophils Absolute: 0 K/uL (ref 0.0–0.1)
Basophils Relative: 0.2 % (ref 0.0–3.0)
Eosinophils Absolute: 0.3 K/uL (ref 0.0–0.7)
Eosinophils Relative: 2 % (ref 0.0–5.0)
HCT: 46.2 % — ABNORMAL HIGH (ref 36.0–46.0)
Hemoglobin: 15.2 g/dL — ABNORMAL HIGH (ref 12.0–15.0)
Lymphocytes Relative: 23.4 % (ref 12.0–46.0)
Lymphs Abs: 3.6 K/uL (ref 0.7–4.0)
MCHC: 32.9 g/dL (ref 30.0–36.0)
MCV: 85.9 fl (ref 78.0–100.0)
Monocytes Absolute: 1 K/uL (ref 0.1–1.0)
Monocytes Relative: 6.3 % (ref 3.0–12.0)
Neutro Abs: 10.4 K/uL — ABNORMAL HIGH (ref 1.4–7.7)
Neutrophils Relative %: 68.1 % (ref 43.0–77.0)
Platelets: 415 K/uL — ABNORMAL HIGH (ref 150.0–400.0)
RBC: 5.38 Mil/uL — ABNORMAL HIGH (ref 3.87–5.11)
RDW: 13.6 % (ref 11.5–15.5)
WBC: 15.4 K/uL — ABNORMAL HIGH (ref 4.0–10.5)

## 2024-04-07 LAB — SEDIMENTATION RATE: Sed Rate: 6 mm/h (ref 0–20)

## 2024-04-07 LAB — T4, FREE: Free T4: 0.85 ng/dL (ref 0.60–1.60)

## 2024-04-07 LAB — T3, FREE: T3, Free: 3.6 pg/mL (ref 2.3–4.2)

## 2024-04-07 LAB — TSH: TSH: 2.22 u[IU]/mL (ref 0.35–5.50)

## 2024-04-07 MED ORDER — BUTALBITAL-APAP-CAFFEINE 50-325-40 MG PO TABS: 1.0000 | ORAL_TABLET | Freq: Four times a day (QID) | ORAL | 0 refills | Status: AC | PRN

## 2024-04-07 MED ORDER — DEXAMETHASONE SODIUM PHOSPHATE 10 MG/ML IJ SOLN
10.0000 mg | Freq: Once | INTRAMUSCULAR | Status: AC
  Administered 2024-04-07: 10 mg via INTRAMUSCULAR

## 2024-04-07 NOTE — Progress Notes (Signed)
 Established Patient Office Visit  Subjective:      CC:  Chief Complaint  Patient presents with   Migraine    HPI: [name] is a 43 y.o. female presenting on 04/07/2024 for Migraine .  Discussed the use of AI scribe software for clinical note transcription with the patient, who gave verbal consent to proceed.  History of Present Illness [name] is a 43 year old female with migraines who presents with a severe migraine unresponsive to usual treatment.  She has been experiencing a severe migraine since Monday, initially attributing it to weather changes. This migraine is more intense than her usual episodes, which occur once or twice a month and are typically relieved by Ubrelvy . However, this episode has persisted despite taking Ubrelvy , which only provides temporary relief. She experiences photophobia and nausea, but phonophobia is not significant.  Her migraine history includes migraines with auras, and she takes Topamax  100 mg daily as a preventive measure. For acute relief, she uses Ubrelvy  as needed, but this current episode has not responded well. She also takes Compazine  daily as part of her migraine prevention regimen and has used Zofran  in the past for nausea associated with nephrolithiasis.  She has an allergy to ibuprofen, which causes a rash and pruritus, and has previously taken Toradol  with Benadryl  to mitigate allergic reactions. She has tried taking ibuprofen with Benadryl  at home out of desperation, but it did not alleviate her symptoms.  There is no recent increase in the frequency of her migraines, maintaining a pattern of one to two episodes per month. No paresthesia, gait instability, or emesis, although she has come close to vomiting. She reports some sinus pressure but no congestion, pharyngitis, otalgia, or cough.  Her past medical history includes cataract surgery earlier this year and thyroid  issues with a heterogeneous thyroid  and  nodules. She had a normal CT of the head in February 2024 and has been previously treated with a migraine cocktail in the emergency room for severe episodes.         Social history:  Relevant past medical, surgical, family and social history reviewed and updated as indicated. Interim medical history since our last visit reviewed.  Allergies and medications reviewed and updated.  DATA REVIEWED: CHART IN EPIC     ROS: Negative unless specifically indicated above in HPI.    Current Outpatient Medications:    brimonidine (ALPHAGAN) 0.15 % ophthalmic solution, Place 1 drop into both eyes daily., Disp: , Rfl:    butalbital -acetaminophen -caffeine  (FIORICET) 50-325-40 MG tablet, Take 1 tablet by mouth every 6 (six) hours as needed for headache., Disp: 14 tablet, Rfl: 0   calcium-vitamin D  (OSCAL WITH D) 500-200 MG-UNIT tablet, Take 1 tablet by mouth daily with breakfast., Disp: , Rfl:    Cholecalciferol (VITAMIN D3) 50 MCG (2000 UT) capsule, Take 1 capsule (2,000 Units total) by mouth daily., Disp: , Rfl:    EPINEPHrine  0.3 mg/0.3 mL IJ SOAJ injection, INJECT 0.3 MLS INTO THE MUSCLE ONCE FOR 1 DOSE., Disp: 2 each, Rfl: 1   esomeprazole (NEXIUM) 40 MG packet, Take 40 mg by mouth daily before breakfast., Disp: 90 each, Rfl: 1   hydrocortisone -pramoxine (PROCTOFOAM  HC) rectal foam, Place 1 applicator rectally 2 (two) times daily., Disp: 10 g, Rfl: 0   methylphenidate  (RITALIN ) 10 MG tablet, Take 1.5-2 tablets (15-20 mg total) by mouth 2 (two) times daily., Disp: 120 tablet, Rfl: 0   MIEBO 1.338 GM/ML SOLN, Place 3 mLs into both eyes in  the morning, at noon, in the evening, and at bedtime., Disp: , Rfl:    ondansetron  (ZOFRAN -ODT) 4 MG disintegrating tablet, Take 1 tablet (4 mg total) by mouth every 8 (eight) hours as needed., Disp: 20 tablet, Rfl: 0   oxyCODONE -acetaminophen  (PERCOCET/ROXICET) 5-325 MG tablet, Take 1 tablet by mouth every 8 (eight) hours as needed for severe pain (pain score  7-10)., Disp: 8 tablet, Rfl: 0   prochlorperazine  (COMPAZINE ) 10 MG tablet, TAKE 1 TABLET BY MOUTH EVERY 8 (EIGHT) HOURS AS NEEDED FOR NAUSEA OR VOMITING (FOR MIGRAINE)., Disp: 90 tablet, Rfl: 1   tiZANidine  (ZANAFLEX ) 4 MG tablet, TAKE 1 TABLET BY MOUTH EVERY 6 HOURS AS NEEDED, Disp: 360 tablet, Rfl: 1   topiramate  (TOPAMAX ) 100 MG tablet, TAKE 1 TABLET BY MOUTH EVERY DAY, Disp: 90 tablet, Rfl: 2   Ubrogepant  (UBRELVY ) 100 MG TABS, Take 1 tablet (100 mg total) by mouth daily as needed (for migraine)., Disp: 48 tablet, Rfl: 3        Objective:        BP 116/78 (BP Location: Right Arm, Patient Position: Sitting, Cuff Size: Large)   Pulse 80   Temp 98.3 F (36.8 C) (Temporal)   Ht 5' 6 (1.676 m)   Wt 205 lb (93 kg)   SpO2 98%   BMI 33.09 kg/m   Physical Exam VITALS: T- 98.3 NECK: Neck non-tender. No spinal tenderness. Thyroid  palpable with nodules.  Wt Readings from Last 3 Encounters:  04/07/24 205 lb (93 kg)  12/23/23 209 lb 7 oz (95 kg)  12/21/23 211 lb 6.4 oz (95.9 kg)    Physical Exam Vitals reviewed.  HENT:     Head: Normocephalic.     Right Ear: Hearing, tympanic membrane, ear canal and external ear normal.     Left Ear: Hearing, tympanic membrane, ear canal and external ear normal.     Mouth/Throat:     Pharynx: No postnasal drip.     Tonsils: No tonsillar exudate.  Neck:     Thyroid : Thyromegaly present. No thyroid  tenderness.  Neurological:     General: No focal deficit present.     Mental Status: She is alert and oriented to person, place, and time.     Cranial Nerves: Cranial nerves 2-12 are intact. No cranial nerve deficit or facial asymmetry.     Motor: Motor function is intact.     Coordination: Coordination is intact.     Gait: Gait is intact.          Results RADIOLOGY Head CT: Normal (08/30/2022) Thyroid  ultrasound: Heterogeneous thyroid  with nodules, small benign-appearing lymph nodes, nonspecific medial gland (2018)  Assessment & Plan:    Assessment and Plan Assessment & Plan Migraine with aura, not intractable, without status migrainosus Migraine with aura has persisted since Monday, presenting with typical symptoms of aura, photophobia, and nausea. Current episode is more severe than usual and not fully relieved by Ubrelvy  or other medications. Frequency remains at once or twice a month without new neurological symptoms such as paresthesia or ataxia. Previous CT of the head in February 2024 was normal. Differential diagnosis includes possible COVID-19 due to a recent similar case, though it is unlikely. Ibuprofen allergy limits treatment options. - Administer dexamethasone  injection to alleviate migraine symptoms. - Prescribe Fioricet for additional relief, especially for tension-related headache components. - Perform COVID-19 test to rule out COVID-19 as a cause of the migraine. - Advise against use of ibuprofen, Aleve, or Advil due to allergy. - Instruct on neck  exercises and application of heat to alleviate neck tightness. - Order sed rate and additional labs if COVID-19 test is negative. - Consider referral to neurologist if migraines persist or worsen.        Return if symptoms worsen or fail to improve.     Ginger Patrick, MSN, APRN, FNP-C [place] [facility]

## 2024-04-07 NOTE — Telephone Encounter (Signed)
 NOTED Will see pt as scheduled

## 2024-04-07 NOTE — Telephone Encounter (Signed)
 FYI Only or Action Required?: Action required by provider: request for appointment.  Patient was last seen in primary care on 12/21/2023 by Cleatus Arlyss RAMAN, MD.  Called Nurse Triage reporting Migraine.  Symptoms began several days ago.  Interventions attempted: Prescription medications:  SABRA  Symptoms are: gradually worsening. Rx not helping.   Triage Disposition: See HCP Within 4 Hours (Or PCP Triage)  Patient/caregiver understands and will follow disposition?:    Copied from CRM #[id number]. Topic: Clinical - Red Word Triage >> Apr 07, 2024  8:02 AM Suzen RAMAN wrote: Red Word that prompted transfer to Nurse Triage: intermittent worsening migraine(headache requesting an appt. Reason for Disposition  [1] SEVERE headache (e.g., excruciating) AND [2] not improved after 2 hours of pain medicine  Answer Assessment - Initial Assessment Questions 1. LOCATION: Where does it hurt?      Front and temples 2. ONSET: When did the headache start? (e.g., minutes, hours, days)      Monday 3. PATTERN: Does the pain come and go, or has it been constant since it started?     constant 4. SEVERITY: How bad is the pain? and What does it keep you from doing?  (e.g., Scale 1-10; mild, moderate, or severe)     Worse - 8 5. RECURRENT SYMPTOM: Have you ever had headaches before? If Yes, ask: When was the last time? and What happened that time?      yes 6. CAUSE: What do you think is causing the headache?     migraine 7. MIGRAINE: Have you been diagnosed with migraine headaches? If Yes, ask: Is this headache similar?      yes 8. HEAD INJURY: Has there been any recent injury to your head?      no 9. OTHER SYMPTOMS: Do you have any other symptoms? (e.g., fever, stiff neck, eye pain, sore throat, cold symptoms)     Light sensitive, nausea 10. PREGNANCY: Is there any chance you are pregnant? When was your last menstrual period?       no  Protocols used: Headache-A-AH

## 2024-04-13 ENCOUNTER — Ambulatory Visit: Payer: Self-pay | Admitting: Family

## 2024-04-13 DIAGNOSIS — D72829 Elevated white blood cell count, unspecified: Secondary | ICD-10-CM

## 2024-04-13 DIAGNOSIS — D582 Other hemoglobinopathies: Secondary | ICD-10-CM

## 2024-04-17 ENCOUNTER — Other Ambulatory Visit: Payer: Self-pay | Admitting: Family Medicine

## 2024-04-18 NOTE — Telephone Encounter (Signed)
 LOV: 12/21/23 CPE NOV: nothing scheduled Last Refill: prochlorperazine  (COMPAZINE ) 10 MG tablet  02/03/24 90 tablets 1 refills

## 2024-04-27 ENCOUNTER — Other Ambulatory Visit

## 2024-05-16 ENCOUNTER — Encounter: Payer: Self-pay | Admitting: Family Medicine

## 2024-05-16 DIAGNOSIS — F988 Other specified behavioral and emotional disorders with onset usually occurring in childhood and adolescence: Secondary | ICD-10-CM

## 2024-05-17 MED ORDER — METHYLPHENIDATE HCL 10 MG PO TABS
15.0000 mg | ORAL_TABLET | Freq: Two times a day (BID) | ORAL | 0 refills | Status: AC
Start: 2024-05-17 — End: ?

## 2024-05-17 NOTE — Telephone Encounter (Signed)
 Name of Medication:  Ritalin  Name of Pharmacy:  [facility] Last Kandra or Written Date and Quantity:  02/24/24, #120 Last Office Visit and Type:  12/21/23, CPE  Next Office Visit and Type:  none Last Controlled Substance Agreement Date:  04/25/15 Last UDS:  04/25/15

## 2024-07-26 ENCOUNTER — Other Ambulatory Visit

## 2024-08-02 ENCOUNTER — Other Ambulatory Visit

## 2024-08-23 ENCOUNTER — Other Ambulatory Visit
